# Patient Record
Sex: Female | Born: 1970
Health system: Southern US, Community
[De-identification: ages and names within clinical notes are randomized; demographics above are authoritative.]

## PROBLEM LIST (undated history)

## (undated) DIAGNOSIS — G4730 Sleep apnea, unspecified: Secondary | ICD-10-CM

## (undated) DIAGNOSIS — J342 Deviated nasal septum: Secondary | ICD-10-CM

## (undated) DIAGNOSIS — Z923 Personal history of irradiation: Secondary | ICD-10-CM

## (undated) HISTORY — PX: OTHER SURGICAL HISTORY: SHX169

## (undated) HISTORY — DX: Personal history of irradiation: Z92.3

## (undated) HISTORY — PX: ENDOMETRIAL ABLATION W/ NOVASURE: SUR434

---

## 1997-08-19 ENCOUNTER — Encounter: Admission: RE | Admit: 1997-08-19 | Discharge: 1997-08-19 | Payer: Self-pay | Admitting: *Deleted

## 1997-10-13 ENCOUNTER — Encounter: Admission: RE | Admit: 1997-10-13 | Discharge: 1997-10-13 | Payer: Self-pay | Admitting: *Deleted

## 1998-01-12 ENCOUNTER — Encounter: Admission: RE | Admit: 1998-01-12 | Discharge: 1998-01-12 | Payer: Self-pay | Admitting: *Deleted

## 1998-03-18 ENCOUNTER — Ambulatory Visit (HOSPITAL_COMMUNITY): Admission: RE | Admit: 1998-03-18 | Discharge: 1998-03-18 | Payer: Self-pay | Admitting: Emergency Medicine

## 1999-03-18 ENCOUNTER — Other Ambulatory Visit: Admission: RE | Admit: 1999-03-18 | Discharge: 1999-03-18 | Payer: Self-pay | Admitting: Gynecology

## 2000-03-29 ENCOUNTER — Other Ambulatory Visit: Admission: RE | Admit: 2000-03-29 | Discharge: 2000-03-29 | Payer: Self-pay | Admitting: Gynecology

## 2001-04-15 ENCOUNTER — Ambulatory Visit (HOSPITAL_COMMUNITY): Admission: RE | Admit: 2001-04-15 | Discharge: 2001-04-15 | Payer: Self-pay | Admitting: Obstetrics and Gynecology

## 2001-04-15 ENCOUNTER — Other Ambulatory Visit: Admission: RE | Admit: 2001-04-15 | Discharge: 2001-04-15 | Payer: Self-pay | Admitting: Obstetrics and Gynecology

## 2001-04-15 ENCOUNTER — Encounter: Payer: Self-pay | Admitting: Obstetrics and Gynecology

## 2001-07-14 HISTORY — PX: DILATION AND EVACUATION: SHX1459

## 2001-11-26 ENCOUNTER — Ambulatory Visit (HOSPITAL_COMMUNITY): Admission: RE | Admit: 2001-11-26 | Discharge: 2001-11-26 | Payer: Self-pay | Admitting: Obstetrics and Gynecology

## 2001-11-26 ENCOUNTER — Encounter: Payer: Self-pay | Admitting: Obstetrics and Gynecology

## 2002-02-03 ENCOUNTER — Ambulatory Visit (HOSPITAL_COMMUNITY): Admission: RE | Admit: 2002-02-03 | Discharge: 2002-02-03 | Payer: Self-pay | Admitting: Obstetrics and Gynecology

## 2002-04-21 ENCOUNTER — Inpatient Hospital Stay (HOSPITAL_COMMUNITY): Admission: AD | Admit: 2002-04-21 | Discharge: 2002-04-21 | Payer: Self-pay | Admitting: Obstetrics and Gynecology

## 2002-04-26 ENCOUNTER — Inpatient Hospital Stay (HOSPITAL_COMMUNITY): Admission: AD | Admit: 2002-04-26 | Discharge: 2002-04-29 | Payer: Self-pay | Admitting: Obstetrics and Gynecology

## 2002-06-16 ENCOUNTER — Other Ambulatory Visit: Admission: RE | Admit: 2002-06-16 | Discharge: 2002-06-16 | Payer: Self-pay | Admitting: Obstetrics and Gynecology

## 2003-10-02 ENCOUNTER — Other Ambulatory Visit: Admission: RE | Admit: 2003-10-02 | Discharge: 2003-10-02 | Payer: Self-pay | Admitting: Obstetrics and Gynecology

## 2004-02-01 ENCOUNTER — Ambulatory Visit (HOSPITAL_COMMUNITY): Admission: RE | Admit: 2004-02-01 | Discharge: 2004-02-01 | Payer: Self-pay | Admitting: Obstetrics and Gynecology

## 2004-04-19 ENCOUNTER — Inpatient Hospital Stay (HOSPITAL_COMMUNITY): Admission: AD | Admit: 2004-04-19 | Discharge: 2004-04-21 | Payer: Self-pay | Admitting: Obstetrics and Gynecology

## 2004-12-29 ENCOUNTER — Other Ambulatory Visit: Admission: RE | Admit: 2004-12-29 | Discharge: 2004-12-29 | Payer: Self-pay | Admitting: Obstetrics and Gynecology

## 2005-05-02 ENCOUNTER — Ambulatory Visit (HOSPITAL_COMMUNITY): Admission: RE | Admit: 2005-05-02 | Discharge: 2005-05-02 | Payer: Self-pay | Admitting: Obstetrics and Gynecology

## 2005-07-21 ENCOUNTER — Inpatient Hospital Stay (HOSPITAL_COMMUNITY): Admission: AD | Admit: 2005-07-21 | Discharge: 2005-07-23 | Payer: Self-pay | Admitting: Obstetrics and Gynecology

## 2008-02-14 HISTORY — PX: ENDOMETRIAL ABLATION W/ NOVASURE: SUR434

## 2008-04-27 ENCOUNTER — Ambulatory Visit (HOSPITAL_BASED_OUTPATIENT_CLINIC_OR_DEPARTMENT_OTHER): Admission: RE | Admit: 2008-04-27 | Discharge: 2008-04-27 | Payer: Self-pay | Admitting: Obstetrics and Gynecology

## 2008-04-27 ENCOUNTER — Ambulatory Visit: Payer: Self-pay | Admitting: Diagnostic Radiology

## 2010-07-01 NOTE — Discharge Summary (Signed)
Amber [name], Amber [name]               ACCOUNT NO.:  [id number]   MEDICAL RECORD NO.:  [id number]          PATIENT TYPE:  INP   LOCATION:  9129                          FACILITY:  [facility]   PHYSICIAN:  Huel Cote, M.D. DATE OF BIRTH:  Feb 23, 1970   DATE OF ADMISSION:  04/19/2004  DATE OF DISCHARGE:  04/21/2004                                 DISCHARGE SUMMARY   DISCHARGE DIAGNOSES:  1.  Term pregnancy at 39+ weeks delivered.  2.  Status post normal spontaneous vaginal delivery.   DISCHARGE MEDICATIONS:  1.  Motrin 600 mg p.o. q.6h.  2.  Darvocet one to two tablets p.o. q.4h. p.r.n.   DISCHARGE FOLLOWUP:  The patient is to follow up in the office in  approximately 6 weeks for her routine postpartum exam.   HOSPITAL COURSE:  The patient is a 40 year old G3, P 1-0-1-1 at 39+ weeks  who presented to labor and delivery in labor. Contractions were every 5  minutes. She had no rupture of membranes. Prenatal labs were as follows:  A  negative, antibody negative, RPR nonreactive, rubella immune, hepatitis B  surface antigen negative, HIV declined, GC negative, chlamydia negative,  group B strep negative, 1-hour Glucola of 107, and triple screen normal.  Past OB history: In March 2004 she had a vaginal delivery of a 7-pound 11-  ounce infant, and a miscarriage x1 prior to that. Past GYN history: None.  Past medical history:  None. Past surgical history: D&C with her miscarriage  in 2003. She had no allergies. On admission she was afebrile with stable  vital signs. Fetal heart rate was reactive. Cervical exam was 80 and 4+ at a  -1. She did very well and progressed nicely after assisted rupture of  membranes performed. She reached complete dilation and pushed well with  normal spontaneous vaginal delivery of a viable female infant over a small  first-degree laceration. Apgars were 7 and 9, weight was 8 pounds 9 ounces.  There is a short cord noted. Otherwise, all was normal. The patient had a  small first-degree laceration which was repaired with 2-0 Vicryl for  hemostasis. She was then admitted for routine postpartum care and did very  well. Postpartum day #2 she was felt stable for discharge home.      KR/MEDQ  D:  05/17/2004  T:  05/17/2004  Job:  [id number]

## 2010-07-01 NOTE — Discharge Summary (Signed)
Amber [name], Amber [name]               ACCOUNT NO.:  [id number]   MEDICAL RECORD NO.:  [id number]          PATIENT TYPE:  INP   LOCATION:  9114                          [facility]ACILITY:  [facility]   PHYSICIAN:  Huel Cote, M.D. DATE OF BIRTH:  Feb 04, 1971   DATE OF ADMISSION:  07/21/2005  DATE OF DISCHARGE:  07/23/2005                                 DISCHARGE SUMMARY   DISCHARGE DIAGNOSES:  1.  Term pregnancy at 39-4/7 weeks, delivered.  2.  Status post normal spontaneous vaginal delivery.  3.  Group B strep positive.   DISCHARGE MEDICATIONS:  Motrin 600 mg p.o. every 6 hours p.r.n.   DISCHARGE FOLLOWUP:  The patient is to follow up in my office in 6 weeks for  her routine postpartum exam.   HOSPITAL COURSE:  The patient is a 40 year old G4, P2-0-1-2, who was  admitted to 39-4/7 weeks' gestation for induction of labor, given term  status of favorable cervix.  Prenatal care was uneventful except for  positive group B strep status.  Prenatal labs are as follows:  A negative,  antibody negative, hepatitis B surface antigen negative, rubella immune, RPR  nonreactive, HIV declined, GC negative, chlamydia negative, 1-hour Glucola  111 and group B strep positive.   PAST OBSTETRICAL HISTORY:  She had 1 spontaneous miscarriage and 2 vaginal  deliveries at term without complications.   PAST GYNECOLOGICAL HISTORY:  None.   PAST MEDICAL HISTORY:  None.   PAST SURGICAL HISTORY:  D&C only.   ALLERGIES:  She had no known drug allergies.   PHYSICAL EXAMINATION:  VITAL SIGNS:  On admission, she was afebrile.  Fetal  heart rate was reactive.  Contractions were approximately every 10 minutes.  estimated fetal weight was 7-1/2 to 8 pounds.  Pelvic exam was 50, 2+ and -2  station.   HOSPITAL COURSE:  She had rupture of membranes with clear fluid noted and  was placed on penicillin for her group-B-strep-positive status.  Shortly  after arrival, the patient quickly progressed to complete dilation and  pushed well.  I was called to see the patient for fetal heart rate  decelerations noted to the 70s, which did not resolve with repositioning and  application of oxygen, therefore the patient was instructed to push and she  did descend the vertex to a +2 to +3 station with approximately 2-3 minutes  of pushing.  Fetal heart rate still did not recover, so the Mityvac vacuum  was applied at the outlet and the vertex was delivered to crowning and 1  pull.  Maternal effort pushed out the remainder of the baby without  difficulty.  A vigorous female infant was born; Apgar scores were 8 and 9.  Weight was 7 pounds 10 ounces.  Placenta delivered spontaneously.  A small  first-degree laceration was repaired  with 2-0 Vicryl for hemostasis.  The cervix and rectum were intact.  The  patient was then kept postpartum.  On postpartum day #2, she was doing very  well.  She was afebrile with stable vital signs.  Her fundus firm and she  was felt stable for discharge  home.      Huel Cote, M.D.  Electronically Signed     KR/MEDQ  D:  07/23/2005  T:  07/24/2005  Job:  [id number]

## 2010-07-01 NOTE — Discharge Summary (Signed)
NAMEMARLEAH, [name]                         ACCOUNT NO.:  [id number]   MEDICAL RECORD NO.:  [id number]                   PATIENT TYPE:  INP   LOCATION:  9127                                 FACILITY:  [facility]   PHYSICIAN:  Zenaida Niece, M.D.             DATE OF BIRTH:  1970/07/19   DATE OF ADMISSION:  04/26/2002  DATE OF DISCHARGE:  04/29/2002                                 DISCHARGE SUMMARY   ADMISSION DIAGNOSES:  1. Intrauterine pregnancy at 39 weeks.  2. Premature rupture of membranes.   DISCHARGE DIAGNOSES:  1. Intrauterine pregnancy at 39 weeks.  2. Premature rupture of membranes.   PROCEDURE:  On March 14, she had a vacuum-assisted vaginal delivery.   HISTORY OF PRESENT ILLNESS:  This is a 40 year old, white female, G2, P0-0-1-  0 with an EGA of [redacted] weeks by LMP consistent with a nine-week ultrasound who  presents with the complaint of a spontaneous rupture of membranes at 1500  hours on the day of admission with clear fluid, with occasional  contractions, no bleeding and good fetal movement.  Evaluation confirmed  rupture of membranes and her vaginal exam initially was 1 cm dilated.   PRENATAL COURSE:  She was treated with Omnicef at approximately 26 weeks for  bronchitis and was otherwise uncomplicated.   PRENATAL LABORATORY DATA:  Blood type A negative with a negative antibody  screen.  RPR nonreactive.  Rubella immune.  Hepatitis B surface antigen  negative.  HIV negative.  Gonorrhea and Chlamydia negative.  Pap smear  normal in March 2003.  Triple screen normal.  One-hour Glucola 98.  Group B  Streptococcus is negative.   PAST OBSTETRICAL HISTORY:  In 2003, she had a missed abortion treated with a  D&C.   PHYSICAL EXAMINATION:  VITAL SIGNS:  Afebrile with stable vital signs.  Fetal heart tracing reactive with contractions every three to four minutes.  ABDOMEN:  Gravid with an estimated fetal weight of 7.5 pounds.  PELVIC:  When I first saw the patient, per  the nurse, she was 3 cm dilated  with a vertex presentation.   HOSPITAL COURSE:  The patient was admitted and initially walked and had  increasing contractions.  With her increased contractions, she received an  epidural.  Her contractions then spaced out and she was started on low dose  Pitocin.  Throughout the evening of March 13, and the early morning of March  14, she progressed to complete and pushed for approximately two hours.  She  had frequent nausea and vomiting with pushing and the fetal heart tracing  remained reassuring except at the end with the baseline increasing to the  160s.  She then had a vacuum-assisted vaginal delivery.  She delivered a  viable female infant with Apgar's 9 and 9 that weighed 7 pounds 11 ounces.  The baby was in the OA to LOA position.  Placenta delivered spontaneous and  was intact.  She had bilateral labial lacerations and a first-degree  perineal laceration.  The right labial and first-degree perineal lacerations  were repaired with 3-0 Vicryl repeat for hemostasis.  Left labial laceration  was hemostatic and not repaired.  Estimated blood loss was less than 500 cc.  Postpartum, the patient did very well, remained afebrile and had no  complications.  She breast-fed her baby without problems.  On the morning of  postpartum day #2, she was felt to be stable enough for discharge home.   DIET:  Regular diet.   ACTIVITY:  Pelvic rest.   FOLLOW UP:  Follow up in six weeks.   DISCHARGE MEDICATIONS:  Over-the-counter Motrin as needed.   SPECIAL INSTRUCTIONS:  She is given our discharge pamphlet.                                               Zenaida Niece, M.D.    TDM/MEDQ  D:  04/29/2002  T:  04/29/2002  Job:  [id number]

## 2011-05-02 ENCOUNTER — Other Ambulatory Visit: Payer: Self-pay

## 2011-05-02 MED ORDER — CYCLOBENZAPRINE HCL 10 MG PO TABS: 10.0000 mg | ORAL_TABLET | Freq: Three times a day (TID) | ORAL | Status: AC | PRN

## 2011-05-02 NOTE — Progress Notes (Signed)
Pt c/o of right lower back pain--per dr wert send rx for flexeril 10mg  1/2-1 three times daily as needed for muscle spasms #90 with 1 refill--done

## 2011-06-27 ENCOUNTER — Ambulatory Visit (HOSPITAL_BASED_OUTPATIENT_CLINIC_OR_DEPARTMENT_OTHER)
Admission: RE | Admit: 2011-06-27 | Discharge: 2011-06-27 | Disposition: A | Discharge status: Home / Self Care | Payer: 59 | Source: Ambulatory Visit | Attending: Obstetrics and Gynecology | Admitting: Obstetrics and Gynecology

## 2011-06-27 ENCOUNTER — Other Ambulatory Visit (HOSPITAL_BASED_OUTPATIENT_CLINIC_OR_DEPARTMENT_OTHER): Payer: Self-pay | Admitting: Obstetrics and Gynecology

## 2011-06-27 DIAGNOSIS — Z1231 Encounter for screening mammogram for malignant neoplasm of breast: Secondary | ICD-10-CM | POA: Insufficient documentation

## 2011-09-21 ENCOUNTER — Telehealth: Payer: Self-pay | Admitting: Internal Medicine

## 2011-09-21 ENCOUNTER — Other Ambulatory Visit: Payer: Self-pay | Admitting: Pulmonary Disease

## 2011-09-21 MED ORDER — ONDANSETRON HCL 4 MG PO TABS: 4.0000 mg | ORAL_TABLET | Freq: Three times a day (TID) | ORAL | Status: AC | PRN

## 2011-09-21 NOTE — Telephone Encounter (Signed)
Pt with n/v.  Ok per SN for zofran 4mg  1 tab q8h prn nausea #50 with 3 refills.  rx sent to [facility].  Pt is aware.

## 2011-09-21 NOTE — Telephone Encounter (Signed)
oopened in error while attempting to send communication about another patient.

## 2012-02-02 ENCOUNTER — Other Ambulatory Visit: Payer: Self-pay | Admitting: Pulmonary Disease

## 2012-02-02 MED ORDER — PREDNISONE 10 MG PO TABS: ORAL_TABLET | ORAL | Status: DC

## 2012-04-10 ENCOUNTER — Telehealth: Payer: Self-pay | Admitting: *Deleted

## 2012-04-10 MED ORDER — KETOCONAZOLE 2 % EX CREA: TOPICAL_CREAM | Freq: Every day | CUTANEOUS | Status: DC

## 2012-04-10 MED ORDER — DOXYCYCLINE HYCLATE 100 MG PO TABS: 100.0000 mg | ORAL_TABLET | Freq: Two times a day (BID) | ORAL | Status: DC

## 2012-04-10 NOTE — Telephone Encounter (Signed)
Per MW: ok for doxycycline 100mg  BID x7days #14 w/ 0 refills and ketoconazole topical 2% cream apply daily x2 weeks, 0 refills.  Rx's sent to verified pharmacy.

## 2012-10-22 ENCOUNTER — Other Ambulatory Visit (HOSPITAL_BASED_OUTPATIENT_CLINIC_OR_DEPARTMENT_OTHER): Payer: Self-pay | Admitting: Obstetrics and Gynecology

## 2012-10-22 DIAGNOSIS — Z1231 Encounter for screening mammogram for malignant neoplasm of breast: Secondary | ICD-10-CM

## 2012-10-23 ENCOUNTER — Ambulatory Visit (HOSPITAL_BASED_OUTPATIENT_CLINIC_OR_DEPARTMENT_OTHER)
Admission: RE | Admit: 2012-10-23 | Discharge: 2012-10-23 | Disposition: A | Discharge status: Home / Self Care | Payer: 59 | Source: Ambulatory Visit | Attending: Obstetrics and Gynecology | Admitting: Obstetrics and Gynecology

## 2012-10-23 DIAGNOSIS — Z1231 Encounter for screening mammogram for malignant neoplasm of breast: Secondary | ICD-10-CM | POA: Insufficient documentation

## 2013-07-31 ENCOUNTER — Encounter: Payer: Self-pay | Admitting: Family Medicine

## 2013-07-31 ENCOUNTER — Other Ambulatory Visit (INDEPENDENT_AMBULATORY_CARE_PROVIDER_SITE_OTHER): Payer: 59

## 2013-07-31 ENCOUNTER — Ambulatory Visit (INDEPENDENT_AMBULATORY_CARE_PROVIDER_SITE_OTHER): Payer: 59 | Admitting: Family Medicine

## 2013-07-31 VITALS — BP 94/62 | HR 82 | Ht 68.0 in | Wt 132.0 lb

## 2013-07-31 DIAGNOSIS — M79609 Pain in unspecified limb: Secondary | ICD-10-CM

## 2013-07-31 DIAGNOSIS — M779 Enthesopathy, unspecified: Secondary | ICD-10-CM

## 2013-07-31 DIAGNOSIS — M778 Other enthesopathies, not elsewhere classified: Secondary | ICD-10-CM | POA: Insufficient documentation

## 2013-07-31 DIAGNOSIS — M79671 Pain in right foot: Secondary | ICD-10-CM

## 2013-07-31 DIAGNOSIS — M65979 Unspecified synovitis and tenosynovitis, unspecified ankle and foot: Secondary | ICD-10-CM

## 2013-07-31 DIAGNOSIS — M659 Synovitis and tenosynovitis, unspecified: Secondary | ICD-10-CM

## 2013-07-31 HISTORY — DX: Other enthesopathies, not elsewhere classified: M77.8

## 2013-07-31 NOTE — Progress Notes (Signed)
[name] [address], [place] [address] Phone: [phone] Subjective:     CC: Foot swelling and pain  [id number] Amber [name] is a 43 y.o. female coming in with complaint of foot swelling and pain. Patient states that this and then approximately 1 week and seems to getting worse. Patient has been moving a significant amount of furniture recently. States that overall it is more sore. Patient is able to ambulate without any significant pain. Patient states that last night had significant amount of soreness. Patient is finding it difficult to concentrate at work secondary to some soreness. Patient describes it is more of a dull aching pain. It responded to anti-inflammatories. Patient does not like to take him a regular basis. Patient has not tried ice or any other modalities. Denies any true trauma. Rate the severity at 6/10.     Past medical history, social, surgical and family history all reviewed in electronic medical record.   Review of Systems: No headache, visual changes, nausea, vomiting, diarrhea, constipation, dizziness, abdominal pain, skin rash, fevers, chills, night sweats, weight loss, swollen lymph nodes, body aches, joint swelling, muscle aches, chest pain, shortness of breath, mood changes.   Objective Blood pressure 94/62, pulse 82, height 5\' 8"  (1.727 m), weight 132 lb (59.875 kg), SpO2 98.00%.  General: No apparent distress alert and oriented x3 mood and affect normal, dressed appropriately.  HEENT: Pupils equal, extraocular movements intact  Respiratory: Patient's speak in full sentences and does not appear short of breath  Cardiovascular: No lower extremity edema, non tender, no erythema  Skin: Warm dry intact with no signs of infection or rash on extremities or on axial skeleton.  Abdomen: Soft nontender  Neuro: Cranial nerves II through XII are intact, neurovascularly intact in all extremities with 2+ DTRs and 2+ pulses.   Lymph: No lymphadenopathy of posterior or anterior cervical chain or axillae bilaterally.  Gait normal with good balance and coordination.  MSK:  Non tender with full range of motion and good stability and symmetric strength and tone of shoulders, elbows, wrist, hip, knees bilaterally.  Ankle: Right Patient does have a good amount of swelling over the dorsal aspect of the foot the Range of motion is full in all directions. Strength is 5/5 in all directions. Stable lateral and medial ligaments; squeeze test and kleiger test unremarkable; Talar dome nontender; No pain at base of 5th MT; No tenderness over cuboid; No tenderness over N spot or navicular prominence No tenderness on posterior aspects of lateral and medial malleolus No sign of peroneal tendon subluxations or tenderness to palpation Negative tarsal tunnel tinel's Patient is tender to palpation over the first metatarsal Able to walk 4 steps. Contralateral foot unremarkable  MSK US performed of: Right foot This study was ordered, performed, and interpreted by Charlann Boxer D.O.  Foot/Ankle:   All structures visualized.   Talar dome unremarkable  Ankle mortise without effusion. Peroneus longus and brevis tendons unremarkable on long and transverse views without sheath effusions. Posterior tibialis, flexor hallucis longus, and flexor digitorum longus tendons unremarkable on long and transverse views without sheath effusions. Achilles tendon visualized along length of tendon and unremarkable on long and transverse views without sheath effusion. Anterior Talofibular Ligament and Calcaneofibular Ligaments unremarkable and intact. Deltoid Ligament unremarkable and intact. Plantar fascia intact and without effusion, normal thickness. No increased doppler signal, cap sign, or thickening of tibial cortex. Power doppler signal normal. Patient does have severe tendon sheath effusion of  the extensor hallux tendon.  IMPRESSION:  Extensor  tendinitis of the hallux     Impression and Recommendations:     This case required medical decision making of moderate complexity.

## 2013-07-31 NOTE — Assessment & Plan Note (Signed)
Patient does have significant swelling of the extensor tendons mostly over the first toe. I do not see any signs of a stress fracture. Patient does not have any Lisfranc injury noted. Patient is able angulated and discussed proper shoe choices. We discussed icing and patient was given a trial some anti-inflammatories. Patient will try these interventions and come back again in 2 weeks. Continuing to have pain we may need to consider further imaging such as an x-ray as well as a Dispensing optician. We'll discuss have followup.

## 2013-07-31 NOTE — Patient Instructions (Signed)
Good to meet you Ice bath 20 minutes 2 times daily Take the medicine 3 times daily for 6 days No running but biking or swimming be best right now.  Rigid sole shoes.  Check in in 2 weeks.

## 2014-06-04 ENCOUNTER — Other Ambulatory Visit: Payer: Self-pay | Admitting: Internal Medicine

## 2014-06-04 MED ORDER — DOXYCYCLINE HYCLATE 100 MG PO TABS: 100.0000 mg | ORAL_TABLET | Freq: Two times a day (BID) | ORAL | Status: DC

## 2014-06-26 ENCOUNTER — Other Ambulatory Visit: Payer: Self-pay | Admitting: Internal Medicine

## 2014-06-26 MED ORDER — OSELTAMIVIR PHOSPHATE 75 MG PO CAPS: ORAL_CAPSULE | ORAL | Status: DC

## 2014-08-27 ENCOUNTER — Other Ambulatory Visit: Payer: Self-pay | Admitting: Internal Medicine

## 2014-08-27 MED ORDER — KETOCONAZOLE 2 % EX CREA
TOPICAL_CREAM | Freq: Every day | CUTANEOUS | Status: DC
Start: 2014-08-27 — End: 2017-07-03

## 2015-03-15 MED FILL — SERTRALINE HCL 50 MG TABLET: 50 | 30 days supply | Qty: 30 | Fill #0

## 2015-03-29 DIAGNOSIS — Z0000 Encounter for general adult medical examination without abnormal findings: Secondary | ICD-10-CM | POA: Diagnosis not present

## 2015-03-29 DIAGNOSIS — Z681 Body mass index (BMI) 19 or less, adult: Secondary | ICD-10-CM | POA: Diagnosis not present

## 2015-03-29 DIAGNOSIS — Z01419 Encounter for gynecological examination (general) (routine) without abnormal findings: Secondary | ICD-10-CM | POA: Diagnosis not present

## 2015-03-29 DIAGNOSIS — Z130 Encounter for screening for diseases of the blood and blood-forming organs and certain disorders involving the immune mechanism: Secondary | ICD-10-CM | POA: Diagnosis not present

## 2015-03-29 DIAGNOSIS — Z1389 Encounter for screening for other disorder: Secondary | ICD-10-CM | POA: Diagnosis not present

## 2015-03-29 DIAGNOSIS — Z1231 Encounter for screening mammogram for malignant neoplasm of breast: Secondary | ICD-10-CM | POA: Diagnosis not present

## 2015-05-17 ENCOUNTER — Telehealth: Payer: Self-pay | Admitting: Internal Medicine

## 2015-05-17 MED ORDER — AZITHROMYCIN 250 MG PO TABS: ORAL_TABLET | ORAL | Status: DC

## 2015-05-17 MED FILL — AZITHROMYCIN 250 MG TABLET: 250 | 5 days supply | Qty: 6 | Fill #0

## 2015-05-17 NOTE — Telephone Encounter (Signed)
Pt c/o head cold symptoms. Sinus congestion, stuffiness, and sinus pressure starting over the weekend. Verified pharmacy as [facility] outpatient ph[facility]racy. Spoke with MW and made him aware of symptoms.  Per verbal order from [place] to send zpak  Spoke with pt and informed her that zpak was sent to [place]. Nothing further needed.

## 2016-04-14 ENCOUNTER — Other Ambulatory Visit: Payer: Self-pay | Admitting: Internal Medicine

## 2016-04-14 MED ORDER — AZITHROMYCIN 250 MG PO TABS: ORAL_TABLET | ORAL | 0 refills | Status: DC

## 2016-07-27 DIAGNOSIS — Z1231 Encounter for screening mammogram for malignant neoplasm of breast: Secondary | ICD-10-CM | POA: Diagnosis not present

## 2016-07-28 ENCOUNTER — Other Ambulatory Visit: Payer: Self-pay | Admitting: Obstetrics and Gynecology

## 2016-07-28 DIAGNOSIS — R5381 Other malaise: Secondary | ICD-10-CM

## 2016-07-31 ENCOUNTER — Other Ambulatory Visit: Payer: Self-pay | Admitting: Obstetrics and Gynecology

## 2016-07-31 DIAGNOSIS — N63 Unspecified lump in unspecified breast: Secondary | ICD-10-CM

## 2016-08-03 ENCOUNTER — Other Ambulatory Visit: Payer: Self-pay | Admitting: Obstetrics and Gynecology

## 2016-08-03 ENCOUNTER — Ambulatory Visit
Admission: RE | Admit: 2016-08-03 | Discharge: 2016-08-03 | Disposition: A | Discharge status: Home / Self Care | Payer: 59 | Source: Ambulatory Visit | Attending: Obstetrics and Gynecology | Admitting: Obstetrics and Gynecology

## 2016-08-03 DIAGNOSIS — N63 Unspecified lump in unspecified breast: Secondary | ICD-10-CM

## 2016-08-03 DIAGNOSIS — N6489 Other specified disorders of breast: Secondary | ICD-10-CM | POA: Diagnosis not present

## 2016-08-03 DIAGNOSIS — R922 Inconclusive mammogram: Secondary | ICD-10-CM | POA: Diagnosis not present

## 2016-11-06 ENCOUNTER — Telehealth: Payer: Self-pay | Admitting: Acute Care

## 2016-11-06 MED ORDER — AZITHROMYCIN 250 MG PO TABS: ORAL_TABLET | ORAL | 0 refills | Status: DC

## 2016-11-06 MED FILL — AZITHROMYCIN 250 MG TAB: 250 | 5 days supply | Qty: 6 | Fill #0

## 2016-11-06 NOTE — Telephone Encounter (Signed)
[place] for Zpak per SG  Rx sent to verified pharmacy

## 2016-12-14 ENCOUNTER — Telehealth: Payer: Self-pay | Admitting: Internal Medicine

## 2016-12-14 MED ORDER — ACYCLOVIR 5 % EX OINT: 1.0000 "application " | TOPICAL_OINTMENT | CUTANEOUS | 99 refills | Status: DC

## 2016-12-14 NOTE — Telephone Encounter (Signed)
Okay per Donna verified

## 2016-12-15 MED ORDER — ACYCLOVIR 5 % EX OINT: 1.0000 "application " | TOPICAL_OINTMENT | CUTANEOUS | 99 refills | Status: DC

## 2016-12-15 NOTE — Addendum Note (Signed)
Addended by: Parke Poisson E on: 12/15/2016 03:20 PM   Modules accepted: Orders

## 2016-12-15 NOTE — Telephone Encounter (Signed)
Pharmacy change per pt's request

## 2017-01-26 DIAGNOSIS — Z130 Encounter for screening for diseases of the blood and blood-forming organs and certain disorders involving the immune mechanism: Secondary | ICD-10-CM | POA: Diagnosis not present

## 2017-01-26 DIAGNOSIS — Z1389 Encounter for screening for other disorder: Secondary | ICD-10-CM | POA: Diagnosis not present

## 2017-01-26 DIAGNOSIS — Z1151 Encounter for screening for human papillomavirus (HPV): Secondary | ICD-10-CM | POA: Diagnosis not present

## 2017-01-26 DIAGNOSIS — Z124 Encounter for screening for malignant neoplasm of cervix: Secondary | ICD-10-CM | POA: Diagnosis not present

## 2017-01-26 DIAGNOSIS — Z01419 Encounter for gynecological examination (general) (routine) without abnormal findings: Secondary | ICD-10-CM | POA: Diagnosis not present

## 2017-01-26 DIAGNOSIS — N898 Other specified noninflammatory disorders of vagina: Secondary | ICD-10-CM | POA: Diagnosis not present

## 2017-01-26 DIAGNOSIS — R300 Dysuria: Secondary | ICD-10-CM | POA: Diagnosis not present

## 2017-01-26 LAB — HM PAP SMEAR: HM Pap smear: NEGATIVE

## 2017-01-31 MED FILL — SULFAMETHOXAZOLE-TMP DS TAB: 800-160 | 5 days supply | Qty: 10 | Fill #0

## 2017-02-13 HISTORY — PX: BREAST BIOPSY: SHX20

## 2017-02-16 ENCOUNTER — Ambulatory Visit
Admission: RE | Admit: 2017-02-16 | Discharge: 2017-02-16 | Disposition: A | Discharge status: Home / Self Care | Payer: 59 | Source: Ambulatory Visit | Attending: Obstetrics and Gynecology | Admitting: Obstetrics and Gynecology

## 2017-02-16 ENCOUNTER — Other Ambulatory Visit (INDEPENDENT_AMBULATORY_CARE_PROVIDER_SITE_OTHER): Payer: 59

## 2017-02-16 ENCOUNTER — Telehealth: Payer: Self-pay | Admitting: Pulmonary Disease

## 2017-02-16 ENCOUNTER — Ambulatory Visit (INDEPENDENT_AMBULATORY_CARE_PROVIDER_SITE_OTHER)
Admission: RE | Admit: 2017-02-16 | Discharge: 2017-02-16 | Disposition: A | Discharge status: Home / Self Care | Payer: 59 | Source: Ambulatory Visit | Attending: Pulmonary Disease | Admitting: Pulmonary Disease

## 2017-02-16 ENCOUNTER — Other Ambulatory Visit: Payer: Self-pay | Admitting: Obstetrics and Gynecology

## 2017-02-16 DIAGNOSIS — Z0000 Encounter for general adult medical examination without abnormal findings: Secondary | ICD-10-CM

## 2017-02-16 DIAGNOSIS — N63 Unspecified lump in unspecified breast: Secondary | ICD-10-CM

## 2017-02-16 DIAGNOSIS — N62 Hypertrophy of breast: Secondary | ICD-10-CM | POA: Diagnosis not present

## 2017-02-16 DIAGNOSIS — N6001 Solitary cyst of right breast: Secondary | ICD-10-CM | POA: Diagnosis not present

## 2017-02-16 DIAGNOSIS — N6311 Unspecified lump in the right breast, upper outer quadrant: Secondary | ICD-10-CM | POA: Diagnosis not present

## 2017-02-16 DIAGNOSIS — R922 Inconclusive mammogram: Secondary | ICD-10-CM | POA: Diagnosis not present

## 2017-02-16 LAB — CBC WITH DIFFERENTIAL/PLATELET
BASOS ABS: 0 10*3/uL (ref 0.0–0.1)
BASOS PCT: 0.4 % (ref 0.0–3.0)
EOS ABS: 0 10*3/uL (ref 0.0–0.7)
Eosinophils Relative: 0.1 % (ref 0.0–5.0)
HEMATOCRIT: 43.8 % (ref 36.0–46.0)
HEMOGLOBIN: 14.5 g/dL (ref 12.0–15.0)
LYMPHS PCT: 9 % — AB (ref 12.0–46.0)
Lymphs Abs: 0.7 10*3/uL (ref 0.7–4.0)
MCHC: 33.1 g/dL (ref 30.0–36.0)
MCV: 89.2 fl (ref 78.0–100.0)
MONO ABS: 0.4 10*3/uL (ref 0.1–1.0)
Monocytes Relative: 5.6 % (ref 3.0–12.0)
Neutro Abs: 6.8 10*3/uL (ref 1.4–7.7)
Neutrophils Relative %: 84.9 % — ABNORMAL HIGH (ref 43.0–77.0)
Platelets: 227 10*3/uL (ref 150.0–400.0)
RBC: 4.91 Mil/uL (ref 3.87–5.11)
RDW: 12.8 % (ref 11.5–15.5)
WBC: 8 10*3/uL (ref 4.0–10.5)

## 2017-02-16 LAB — BASIC METABOLIC PANEL
BUN: 14 mg/dL (ref 6–23)
CALCIUM: 9.8 mg/dL (ref 8.4–10.5)
CHLORIDE: 103 meq/L (ref 96–112)
CO2: 30 mEq/L (ref 19–32)
CREATININE: 0.73 mg/dL (ref 0.40–1.20)
GFR: 91.35 mL/min (ref >60.00)
Glucose, Bld: 100 mg/dL — ABNORMAL HIGH (ref 70–99)
Potassium: 4.5 mEq/L (ref 3.5–5.1)
Sodium: 140 mEq/L (ref 135–145)

## 2017-02-16 LAB — HEPATIC FUNCTION PANEL
ALBUMIN: 4.6 g/dL (ref 3.5–5.2)
ALK PHOS: 39 U/L (ref 39–117)
ALT: 16 U/L (ref 0–35)
AST: 17 U/L (ref 0–37)
BILIRUBIN DIRECT: 0.1 mg/dL (ref 0.0–0.3)
TOTAL PROTEIN: 7.3 g/dL (ref 6.0–8.3)
Total Bilirubin: 0.5 mg/dL (ref 0.2–1.2)

## 2017-02-16 LAB — LIPID PANEL
CHOL/HDL RATIO: 2
CHOLESTEROL: 194 mg/dL (ref 0–200)
HDL: 80.3 mg/dL (ref >39.00)
LDL Cholesterol: 98 mg/dL (ref 0–99)
NonHDL: 113.21
TRIGLYCERIDES: 75 mg/dL (ref 0.0–149.0)
VLDL: 15 mg/dL (ref 0.0–40.0)

## 2017-02-16 LAB — VITAMIN D 25 HYDROXY (VIT D DEFICIENCY, FRACTURES): VITD: 31.32 ng/mL (ref 30.00–100.00)

## 2017-02-16 LAB — TSH: TSH: 0.83 u[IU]/mL (ref 0.35–4.50)

## 2017-02-16 NOTE — Telephone Encounter (Signed)
Physical cxr and labs

## 2017-02-17 LAB — RHEUMATOID FACTOR: Rhuematoid fact SerPl-aCnc: <14 IU/mL (ref <14)

## 2017-03-27 DIAGNOSIS — N6310 Unspecified lump in the right breast, unspecified quadrant: Secondary | ICD-10-CM | POA: Diagnosis not present

## 2017-06-06 MED FILL — SULFAMETHOXAZOLE-TMP DS TAB: 800-160 | 7 days supply | Qty: 14 | Fill #0

## 2017-07-03 ENCOUNTER — Encounter: Payer: Self-pay | Admitting: Family

## 2017-07-03 ENCOUNTER — Ambulatory Visit: Payer: 59 | Admitting: Family

## 2017-07-03 VITALS — BP 110/60 | HR 69 | Temp 98.4°F | Resp 16 | Ht 68.0 in | Wt 134.8 lb

## 2017-07-03 DIAGNOSIS — Z0000 Encounter for general adult medical examination without abnormal findings: Secondary | ICD-10-CM | POA: Diagnosis not present

## 2017-07-03 NOTE — Progress Notes (Signed)
Subjective:    Patient ID: Amber [name], female    DOB: 1970-08-25, 47 y.o.   MRN: [id number]  HPI   Patient presents today for complete physical.  Immunizations: tdap 2017 Diet: healthy Exercise: walks, some running, interval training, paddle boarding Pap Smear: 12/18- normal Mammogram: 1/19 Dental: up to date Vision: up to date   Review of Systems  Constitutional: Negative for unexpected weight change.  HENT: Negative for hearing loss and rhinorrhea.   Eyes: Negative for visual disturbance.  Respiratory: Negative for cough and shortness of breath.   Cardiovascular: Negative for chest pain and leg swelling.  Gastrointestinal: Negative for blood in stool, constipation and diarrhea.  Genitourinary: Negative for dysuria and frequency.  Musculoskeletal:       Use advil for neck/back pain  Skin: Negative for rash.  Neurological: Negative for headaches.  Hematological: Negative for adenopathy.  Psychiatric/Behavioral:       Denies depression/anxiety   History reviewed. No pertinent past medical history.   Social History   Socioeconomic History  . Marital status: Married    Spouse name: Not on file  . Number of children: 3  . Years of education: Not on file  . Highest education level: Not on file  Occupational History  . Occupation: Nurse P.  Social Needs  . Financial resource strain: Not hard at all  . Food insecurity:    Worry: Never true    Inability: Never true  . Transportation needs:    Medical: No    Non-medical: No  Tobacco Use  . Smoking status: Never Smoker  . Smokeless tobacco: Never Used  Substance and Sexual Activity  . Alcohol use: Not Currently  . Drug use: Never  . Sexual activity: Yes    Partners: Male  Lifestyle  . Physical activity:    Days per week: 3 days    Minutes per session: 40 min  . Stress: To some extent  Relationships  . Social connections:    Talks on phone: More than three times a week    Gets together: Twice a week    Attends religious service: More than 4 times per year    Active member of club or organization: No    Attends meetings of clubs or organizations: Never    Relationship status: Married  . Intimate partner violence:    Fear of current or ex partner: No    Emotionally abused: No    Physically abused: No    Forced sexual activity: No  Other Topics Concern  . Not on file  Social History Narrative  . Not on file    Past Surgical History:  Procedure Laterality Date  . BREAST BIOPSY Right 2019  . none      Family History  Problem Relation Age of Onset  . Breast cancer Paternal Aunt   . Arthritis Mother   . GER disease Mother   . Arthritis Sister     No Known Allergies  No current outpatient medications on file prior to visit.   No current facility-administered medications on file prior to visit.     BP (!) 166/66 (BP Location: Right Arm, Patient Position: Sitting, Cuff Size: Small)   Pulse 69   Temp 98.4 F (36.9 C) (Oral)   Resp 16   Ht 5\' 8"  (1.727 m)   Wt 134 lb 12.8 oz (61.1 kg)   SpO2 100%   BMI 20.50 kg/m        Objective:   Physical Exam  Physical Exam  Constitutional: She is oriented to person, place, and time. She appears well-developed and well-nourished. No distress.  HENT:  Head: Normocephalic and atraumatic.  Right Ear: Tympanic membrane and ear canal normal.  Left Ear: Tympanic membrane and ear canal normal.  Mouth/Throat: Oropharynx is clear and moist.  Eyes: Pupils are equal, round, and reactive to light. No scleral icterus.  Neck: Normal range of motion. No thyromegaly present.  Cardiovascular: Normal rate and regular rhythm.   No murmur heard. Pulmonary/Chest: Effort normal and breath sounds normal. No respiratory distress. He has no wheezes. She has no rales. She exhibits no tenderness.  Abdominal: Soft. Bowel sounds are normal. She exhibits no distension and no mass. There is no tenderness. There is no rebound and no guarding.    Musculoskeletal: She exhibits no edema.  Lymphadenopathy:    She has no cervical adenopathy.  Neurological: She is alert and oriented to person, place, and time. She has normal patellar reflexes. She exhibits normal muscle tone. Coordination normal.  Skin: Skin is warm and dry.  Psychiatric: She has a normal mood and affect. Her behavior is normal. Judgment and thought content normal.  Breast/pelvic: deferred          Assessment & Plan:   Preventative care- encouraged continued healthy diet/exercise. Had complete panel of lab work performed this winter which is reviewed. Tetanus up to date.  Pap/mammo up to date.  EKG tracing is personally reviewed.  EKG notes NSR.  No acute changes.        Assessment & Plan:

## 2017-07-03 NOTE — Patient Instructions (Signed)
Continue healthy diet and regular exercise.  Preventive Care 40-64 Years, Female Preventive care refers to lifestyle choices and visits with your health care provider that can promote health and wellness. What does preventive care include?  A yearly physical exam. This is also called an annual well check.  Dental exams once or twice a year.  Routine eye exams. Ask your health care provider how often you should have your eyes checked.  Personal lifestyle choices, including: ? Daily care of your teeth and gums. ? Regular physical activity. ? Eating a healthy diet. ? Avoiding tobacco and drug use. ? Limiting alcohol use. ? Practicing safe sex. ? Taking low-dose aspirin daily starting at age 65. ? Taking vitamin and mineral supplements as recommended by your health care provider. What happens during an annual well check? The services and screenings done by your health care provider during your annual well check will depend on your age, overall health, lifestyle risk factors, and family history of disease. Counseling Your health care provider may ask you questions about your:  Alcohol use.  Tobacco use.  Drug use.  Emotional well-being.  Home and relationship well-being.  Sexual activity.  Eating habits.  Work and work Statistician.  Method of birth control.  Menstrual cycle.  Pregnancy history.  Screening You may have the following tests or measurements:  Height, weight, and BMI.  Blood pressure.  Lipid and cholesterol levels. These may be checked every 5 years, or more frequently if you are over 65 years old.  Skin check.  Lung cancer screening. You may have this screening every year starting at age 74 if you have a 30-pack-year history of smoking and currently smoke or have quit within the past 15 years.  Fecal occult blood test (FOBT) of the stool. You may have this test every year starting at age 29.  Flexible sigmoidoscopy or colonoscopy. You may have a  sigmoidoscopy every 5 years or a colonoscopy every 10 years starting at age 39.  Hepatitis C blood test.  Hepatitis B blood test.  Sexually transmitted disease (STD) testing.  Diabetes screening. This is done by checking your blood sugar (glucose) after you have not eaten for a while (fasting). You may have this done every 1-3 years.  Mammogram. This may be done every 1-2 years. Talk to your health care provider about when you should start having regular mammograms. This may depend on whether you have a family history of breast cancer.  BRCA-related cancer screening. This may be done if you have a family history of breast, ovarian, tubal, or peritoneal cancers.  Pelvic exam and Pap test. This may be done every 3 years starting at age 69. Starting at age 41, this may be done every 5 years if you have a Pap test in combination with an HPV test.  Bone density scan. This is done to screen for osteoporosis. You may have this scan if you are at high risk for osteoporosis.  Discuss your test results, treatment options, and if necessary, the need for more tests with your health care provider. Vaccines Your health care provider may recommend certain vaccines, such as:  Influenza vaccine. This is recommended every year.  Tetanus, diphtheria, and acellular pertussis (Tdap, Td) vaccine. You may need a Td booster every 10 years.  Varicella vaccine. You may need this if you have not been vaccinated.  Zoster vaccine. You may need this after age 63.  Measles, mumps, and rubella (MMR) vaccine. You may need at least one dose  of MMR if you were born in 1957 or later. You may also need a second dose.  Pneumococcal 13-valent conjugate (PCV13) vaccine. You may need this if you have certain conditions and were not previously vaccinated.  Pneumococcal polysaccharide (PPSV23) vaccine. You may need one or two doses if you smoke cigarettes or if you have certain conditions.  Meningococcal vaccine. You may  need this if you have certain conditions.  Hepatitis A vaccine. You may need this if you have certain conditions or if you travel or work in places where you may be exposed to hepatitis A.  Hepatitis B vaccine. You may need this if you have certain conditions or if you travel or work in places where you may be exposed to hepatitis B.  Haemophilus influenzae type b (Hib) vaccine. You may need this if you have certain conditions.  Talk to your health care provider about which screenings and vaccines you need and how often you need them. This information is not intended to replace advice given to you by your health care provider. Make sure you discuss any questions you have with your health care provider. Document Released: 02/26/2015 Document Revised: 10/20/2015 Document Reviewed: 12/01/2014 Elsevier Interactive Patient Education  [employer].

## 2017-08-20 ENCOUNTER — Other Ambulatory Visit: Payer: Self-pay | Admitting: Obstetrics and Gynecology

## 2017-08-20 DIAGNOSIS — N6489 Other specified disorders of breast: Secondary | ICD-10-CM

## 2017-08-27 ENCOUNTER — Ambulatory Visit
Admission: RE | Admit: 2017-08-27 | Discharge: 2017-08-27 | Disposition: A | Discharge status: Home / Self Care | Payer: 59 | Source: Ambulatory Visit | Attending: Obstetrics and Gynecology | Admitting: Obstetrics and Gynecology

## 2017-08-27 ENCOUNTER — Other Ambulatory Visit: Payer: Self-pay | Admitting: Obstetrics and Gynecology

## 2017-08-27 DIAGNOSIS — N6320 Unspecified lump in the left breast, unspecified quadrant: Secondary | ICD-10-CM

## 2017-08-27 DIAGNOSIS — N6489 Other specified disorders of breast: Secondary | ICD-10-CM

## 2017-08-27 DIAGNOSIS — R921 Mammographic calcification found on diagnostic imaging of breast: Secondary | ICD-10-CM | POA: Diagnosis not present

## 2017-08-29 ENCOUNTER — Ambulatory Visit
Admission: RE | Admit: 2017-08-29 | Discharge: 2017-08-29 | Disposition: A | Discharge status: Home / Self Care | Payer: 59 | Source: Ambulatory Visit | Attending: Obstetrics and Gynecology | Admitting: Obstetrics and Gynecology

## 2017-08-29 ENCOUNTER — Other Ambulatory Visit: Payer: Self-pay | Admitting: Obstetrics and Gynecology

## 2017-08-29 DIAGNOSIS — N6320 Unspecified lump in the left breast, unspecified quadrant: Secondary | ICD-10-CM

## 2017-08-29 DIAGNOSIS — N6012 Diffuse cystic mastopathy of left breast: Secondary | ICD-10-CM | POA: Diagnosis not present

## 2017-08-29 DIAGNOSIS — R921 Mammographic calcification found on diagnostic imaging of breast: Secondary | ICD-10-CM | POA: Diagnosis not present

## 2017-11-13 ENCOUNTER — Ambulatory Visit (INDEPENDENT_AMBULATORY_CARE_PROVIDER_SITE_OTHER): Payer: 59 | Admitting: Internal Medicine

## 2017-11-13 ENCOUNTER — Encounter: Payer: Self-pay | Admitting: Internal Medicine

## 2017-11-13 VITALS — BP 110/64 | HR 76 | Ht 68.0 in | Wt 135.0 lb

## 2017-11-13 DIAGNOSIS — G4733 Obstructive sleep apnea (adult) (pediatric): Secondary | ICD-10-CM | POA: Insufficient documentation

## 2017-11-13 NOTE — Patient Instructions (Signed)
Order- new DME, new CPAP auto 5-20, mask of choice, humidifier, supplies, AirView Schedule f/u office visit in 31-90 days

## 2017-11-13 NOTE — Assessment & Plan Note (Signed)
Moderate obstructive sleep apnea consistent with history and symptoms but not expected from physical exam.  It may be that nasal congestion causes mouth breathing while supine at night.  She has a good understanding of the basics of OSA and available treatment options.She chooses CPAP Plan start auto CPAP

## 2017-11-13 NOTE — Addendum Note (Signed)
Addended by: Clayborne Dana C on: 11/13/2017 03:26 PM   Modules accepted: Orders

## 2017-11-13 NOTE — Progress Notes (Signed)
11/13/2017-47 year old female never smoker for sleep evaluation, self-referral.  Husband tells her she snores loudly with witnessed apneas.  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 Negative family history of related problems.  No history of ENT surgery, heart or lung problems. No sleep medication.  Occasional coffee. Wakes with dry mouth and is aware of some nasal stuffiness which she thinks might cause mouth breathing. HST 11/12/2017-AHI 17.2/hour, desaturation to 80% with average 95%, body weight 135 pounds  Prior to Admission medications   Not on File   No past medical history on file. Past Surgical History:  Procedure Laterality Date  . BREAST BIOPSY Right 2019  . none     Family History  Problem Relation Age of Onset  . Breast cancer Paternal Aunt   . Arthritis Mother   . GER disease Mother   . Arthritis Sister    Social History   Socioeconomic History  . Marital status: Married    Spouse name: Not on file  . Number of children: 3  . Years of education: Not on file  . Highest education level: Not on file  Occupational History  . Occupation: Nurse P.  Social Needs  . Financial resource strain: Not hard at all  . Food insecurity:    Worry: Never true    Inability: Never true  . Transportation needs:    Medical: No    Non-medical: No  Tobacco Use  . Smoking status: Never Smoker  . Smokeless tobacco: Never Used  Substance and Sexual Activity  . Alcohol use: Not Currently  . Drug use: Never  . Sexual activity: Yes    Partners: Male  Lifestyle  . Physical activity:    Days per week: 3 days    Minutes per session: 40 min  . Stress: To some extent  Relationships  . Social connections:    Talks on phone: More than three times a week    Gets together: Twice a week    Attends religious service: More than 4 times per year    Active member of club or organization: No    Attends meetings of clubs or  organizations: Never    Relationship status: Married  . Intimate partner violence:    Fear of current or ex partner: No    Emotionally abused: No    Physically abused: No    Forced sexual activity: No  Other Topics Concern  . Not on file  Social History Narrative  . Not on file   ROS-see HPI   + = positive Constitutional:    weight loss, night sweats, fevers, chills, +fatigue, lassitude. HEENT:    headaches, difficulty swallowing, tooth/dental problems, sore throat,       sneezing, itching, ear ache, nasal congestion, post nasal drip, +snoring CV:    chest pain, orthopnea, PND, swelling in lower extremities, anasarca,                                  dizziness, palpitations Resp:   shortness of breath with exertion or at rest.                productive cough,   non-productive cough, coughing up of blood.              change in color of mucus.  wheezing.   Skin:    rash or lesions. GI:  No-  heartburn, indigestion, abdominal pain, nausea, vomiting, diarrhea,                 change in bowel habits, loss of appetite GU: dysuria, change in color of urine, no urgency or frequency.   flank pain. MS:   joint pain, stiffness, decreased range of motion, back pain. Neuro-     nothing unusual Psych:  change in mood or affect.  depression or anxiety.   memory loss.  OBJ- Physical Exam General- Alert, Oriented, Affect-appropriate, Distress- none acute, +trim Skin- rash-none, lesions- none, excoriation- none Lymphadenopathy- none Head- atraumatic            Eyes- Gross vision intact, PERRLA, conjunctivae and secretions clear            Ears- Hearing, canals-normal            Nose- Clear, no-Septal dev, mucus, polyps, erosion, perforation             Throat- Mallampati II , mucosa clear , drainage- none, tonsils- atrophic Neck- flexible , trachea midline, no stridor , thyroid nl, carotid no bruit Chest - symmetrical excursion , unlabored           Heart/CV- RRR , no murmur , no gallop  , no  rub, nl s1 s2                           - JVD- none , edema- none, stasis changes- none, varices- none           Lung- clear to P&A, wheeze- none, cough- none , dullness-none, rub- none           Chest wall-  Abd-  Br/ Gen/ Rectal- Not done, not indicated Extrem- cyanosis- none, clubbing, none, atrophy- none, strength- nl Neuro- grossly intact to observation

## 2017-11-15 NOTE — Addendum Note (Signed)
Addended by: Parke Poisson E on: 11/15/2017 05:16 PM   Modules accepted: Orders

## 2017-11-16 DIAGNOSIS — G4733 Obstructive sleep apnea (adult) (pediatric): Secondary | ICD-10-CM | POA: Diagnosis not present

## 2017-11-16 NOTE — Addendum Note (Signed)
Addended by: Parke Poisson E on: 11/16/2017 10:59 AM   Modules accepted: Orders

## 2017-11-20 ENCOUNTER — Telehealth: Payer: Self-pay | Admitting: Internal Medicine

## 2017-11-20 ENCOUNTER — Other Ambulatory Visit: Payer: Self-pay | Admitting: Internal Medicine

## 2017-11-20 DIAGNOSIS — R0981 Nasal congestion: Secondary | ICD-10-CM

## 2017-11-20 DIAGNOSIS — G4733 Obstructive sleep apnea (adult) (pediatric): Secondary | ICD-10-CM

## 2017-11-20 NOTE — Telephone Encounter (Signed)
Pt is aware of change to pressure and order has been placed.

## 2017-11-21 ENCOUNTER — Telehealth: Payer: Self-pay | Admitting: Internal Medicine

## 2017-11-21 DIAGNOSIS — G4733 Obstructive sleep apnea (adult) (pediatric): Secondary | ICD-10-CM

## 2017-11-21 NOTE — Telephone Encounter (Signed)
Patient would like to switch CPAP pressure settings to 5cm  Per CY: let's try auto 4-8cm.  This may be more comfortable.  Patient is aware of CY's recommendations Order placed

## 2017-11-27 DIAGNOSIS — J342 Deviated nasal septum: Secondary | ICD-10-CM | POA: Diagnosis not present

## 2017-11-27 DIAGNOSIS — J3489 Other specified disorders of nose and nasal sinuses: Secondary | ICD-10-CM | POA: Diagnosis not present

## 2017-11-27 DIAGNOSIS — R0981 Nasal congestion: Secondary | ICD-10-CM | POA: Diagnosis not present

## 2017-12-17 DIAGNOSIS — G4733 Obstructive sleep apnea (adult) (pediatric): Secondary | ICD-10-CM | POA: Diagnosis not present

## 2017-12-20 DIAGNOSIS — R0981 Nasal congestion: Secondary | ICD-10-CM | POA: Diagnosis not present

## 2017-12-20 DIAGNOSIS — J342 Deviated nasal septum: Secondary | ICD-10-CM | POA: Diagnosis not present

## 2017-12-20 DIAGNOSIS — G4733 Obstructive sleep apnea (adult) (pediatric): Secondary | ICD-10-CM | POA: Diagnosis not present

## 2017-12-20 DIAGNOSIS — J343 Hypertrophy of nasal turbinates: Secondary | ICD-10-CM | POA: Diagnosis not present

## 2017-12-20 DIAGNOSIS — J3489 Other specified disorders of nose and nasal sinuses: Secondary | ICD-10-CM | POA: Diagnosis not present

## 2018-01-16 DIAGNOSIS — G4733 Obstructive sleep apnea (adult) (pediatric): Secondary | ICD-10-CM | POA: Diagnosis not present

## 2018-02-16 DIAGNOSIS — G4733 Obstructive sleep apnea (adult) (pediatric): Secondary | ICD-10-CM | POA: Diagnosis not present

## 2018-02-25 ENCOUNTER — Other Ambulatory Visit: Payer: Self-pay | Admitting: Obstetrics and Gynecology

## 2018-02-25 DIAGNOSIS — N6310 Unspecified lump in the right breast, unspecified quadrant: Secondary | ICD-10-CM

## 2018-03-08 ENCOUNTER — Ambulatory Visit
Admission: RE | Admit: 2018-03-08 | Discharge: 2018-03-08 | Disposition: A | Discharge status: Home / Self Care | Payer: 59 | Source: Ambulatory Visit | Attending: Obstetrics and Gynecology | Admitting: Obstetrics and Gynecology

## 2018-03-08 DIAGNOSIS — R922 Inconclusive mammogram: Secondary | ICD-10-CM | POA: Diagnosis not present

## 2018-03-08 DIAGNOSIS — N6310 Unspecified lump in the right breast, unspecified quadrant: Secondary | ICD-10-CM

## 2018-03-08 DIAGNOSIS — N6311 Unspecified lump in the right breast, upper outer quadrant: Secondary | ICD-10-CM | POA: Diagnosis not present

## 2018-03-11 DIAGNOSIS — Z01419 Encounter for gynecological examination (general) (routine) without abnormal findings: Secondary | ICD-10-CM | POA: Diagnosis not present

## 2018-03-11 DIAGNOSIS — Z6821 Body mass index (BMI) 21.0-21.9, adult: Secondary | ICD-10-CM | POA: Diagnosis not present

## 2018-03-11 DIAGNOSIS — Z130 Encounter for screening for diseases of the blood and blood-forming organs and certain disorders involving the immune mechanism: Secondary | ICD-10-CM | POA: Diagnosis not present

## 2018-03-11 DIAGNOSIS — Z1389 Encounter for screening for other disorder: Secondary | ICD-10-CM | POA: Diagnosis not present

## 2018-04-08 DIAGNOSIS — G4733 Obstructive sleep apnea (adult) (pediatric): Secondary | ICD-10-CM | POA: Diagnosis not present

## 2018-04-12 ENCOUNTER — Other Ambulatory Visit: Payer: 59

## 2018-04-12 ENCOUNTER — Other Ambulatory Visit: Payer: Self-pay | Admitting: Internal Medicine

## 2018-04-12 DIAGNOSIS — J029 Acute pharyngitis, unspecified: Secondary | ICD-10-CM

## 2018-04-12 LAB — STREP COMPLETE PANEL
STREP DYSGALACTIAE: NEGATIVE
Strep Pyogenes: NEGATIVE

## 2018-04-12 MED ORDER — AZITHROMYCIN 250 MG PO TABS: ORAL_TABLET | ORAL | 0 refills | Status: DC

## 2018-05-21 DIAGNOSIS — G4733 Obstructive sleep apnea (adult) (pediatric): Secondary | ICD-10-CM | POA: Diagnosis not present

## 2018-06-04 ENCOUNTER — Other Ambulatory Visit: Payer: Self-pay | Admitting: Internal Medicine

## 2018-06-04 DIAGNOSIS — R6889 Other general symptoms and signs: Secondary | ICD-10-CM

## 2018-06-17 ENCOUNTER — Ambulatory Visit (INDEPENDENT_AMBULATORY_CARE_PROVIDER_SITE_OTHER): Payer: 59 | Admitting: Acute Care

## 2018-06-17 ENCOUNTER — Encounter: Payer: Self-pay | Admitting: Acute Care

## 2018-06-17 ENCOUNTER — Other Ambulatory Visit: Payer: Self-pay | Admitting: Acute Care

## 2018-06-17 ENCOUNTER — Other Ambulatory Visit: Payer: 59

## 2018-06-17 ENCOUNTER — Other Ambulatory Visit: Payer: Self-pay

## 2018-06-17 VITALS — BP 110/52 | HR 88 | Temp 98.5°F

## 2018-06-17 DIAGNOSIS — R102 Pelvic and perineal pain: Secondary | ICD-10-CM | POA: Diagnosis not present

## 2018-06-17 DIAGNOSIS — R109 Unspecified abdominal pain: Secondary | ICD-10-CM | POA: Diagnosis not present

## 2018-06-17 LAB — URINALYSIS, ROUTINE W REFLEX MICROSCOPIC
Bilirubin Urine: NEGATIVE
Hgb urine dipstick: NEGATIVE
Ketones, ur: NEGATIVE
Leukocytes,Ua: NEGATIVE
Nitrite: NEGATIVE
RBC / HPF: NONE SEEN (ref 0–?)
Specific Gravity, Urine: <=1.005 — AB (ref 1.000–1.030)
Total Protein, Urine: NEGATIVE
Urine Glucose: NEGATIVE
Urobilinogen, UA: 0.2 (ref 0.0–1.0)
WBC, UA: NONE SEEN (ref 0–?)
pH: 6.5 (ref 5.0–8.0)

## 2018-06-17 LAB — COMPREHENSIVE METABOLIC PANEL
ALT: 15 U/L (ref 0–35)
AST: 19 U/L (ref 0–37)
Albumin: 4.6 g/dL (ref 3.5–5.2)
Alkaline Phosphatase: 46 U/L (ref 39–117)
BUN: 14 mg/dL (ref 6–23)
CO2: 25 mEq/L (ref 19–32)
Calcium: 9.4 mg/dL (ref 8.4–10.5)
Chloride: 103 mEq/L (ref 96–112)
Creatinine, Ser: 0.95 mg/dL (ref 0.40–1.20)
GFR: 63.05 mL/min (ref >60.00)
Glucose, Bld: 101 mg/dL — ABNORMAL HIGH (ref 70–99)
Potassium: 3.5 mEq/L (ref 3.5–5.1)
Sodium: 137 mEq/L (ref 135–145)
Total Bilirubin: 0.4 mg/dL (ref 0.2–1.2)
Total Protein: 7.3 g/dL (ref 6.0–8.3)

## 2018-06-17 LAB — CBC
HCT: 40.4 % (ref 36.0–46.0)
Hemoglobin: 13.8 g/dL (ref 12.0–15.0)
MCHC: 34.2 g/dL (ref 30.0–36.0)
MCV: 88.3 fl (ref 78.0–100.0)
Platelets: 197 10*3/uL (ref 150.0–400.0)
RBC: 4.58 Mil/uL (ref 3.87–5.11)
RDW: 13 % (ref 11.5–15.5)
WBC: 8 10*3/uL (ref 4.0–10.5)

## 2018-06-17 NOTE — Assessment & Plan Note (Addendum)
Abdominal Supra pubic pain of 5 day duration Has taken Bactrim x 4 days BID without relief. Plan: CBC CMET Urine Culture UA Referral to Gyn Dr. Marvel Plan in the morning AT 08:30. Ibuprofen 600 mg Q 8 for pain Alternate with Tylenol 500 q 8 Meletonin tonight for sleep Force fluids Rest Follow up Micro UA Urine Culture Consider CT abdomen Pelvis Consider US abdomen

## 2018-06-17 NOTE — Progress Notes (Signed)
History of Present Illness Amber [name] is a 48 y.o. female with no significant PMH . She has had 3 full term pregnancies.  06/17/2018  Pt presents today with 5 day history of suprapubic pain and tenderness. Urinary urgency with slight frequency. No fever, no back pain. Urine has been cloudy, no odor. She did take Azo with no relief.She has had worsening fatigue, difficulty sleeping as sitting and supine positioning make it worse. She had some Bactrim at home and has been taking it for 4 days BID without improvement. She states she actually feels worse. She has very low suspicion of pregnancy. She has tenderness with deep palpation to the suprapubic region, left of umbilicus. Upon deep palpation I feel an area that feels like a  Muscle in spasm that is tender to palpation ( Rectus abdominal muscle).She has no vaginal discharge or odor.She feels her abdomen is distended.No flank pain upon exam.No visible hernia  Test Results: UA 06/17/2018 >> No Nitrites, no blood, no WBC, SG 1.005  CBC    Component Value Date/Time   WBC 8.0 06/17/2018 1544   RBC 4.58 06/17/2018 1544   HGB 13.8 06/17/2018 1544   HCT 40.4 06/17/2018 1544   PLT 197.0 06/17/2018 1544   MCV 88.3 06/17/2018 1544   MCHC 34.2 06/17/2018 1544   RDW 13.0 06/17/2018 1544   LYMPHSABS 0.7 02/16/2017 1212   MONOABS 0.4 02/16/2017 1212   EOSABS 0.0 02/16/2017 1212   BASOSABS 0.0 02/16/2017 1212    CBC Latest Ref Rng & Units 06/17/2018 02/16/2017  WBC 4.0 - 10.5 K/uL 8.0 8.0  Hemoglobin 12.0 - 15.0 g/dL 13.8 14.5  Hematocrit 36.0 - 46.0 % 40.4 43.8  Platelets 150.0 - 400.0 K/uL 197.0 227.0    BMP Latest Ref Rng & Units 06/17/2018 02/16/2017  Glucose 70 - 99 mg/dL 101(H) 100(H)  BUN 6 - 23 mg/dL 14 14  Creatinine 0.40 - 1.20 mg/dL 0.95 0.73  Sodium 135 - 145 mEq/L 137 140  Potassium 3.5 - 5.1 mEq/L 3.5 4.5  Chloride 96 - 112 mEq/L 103 103  CO2 19 - 32 mEq/L 25 30  Calcium 8.4 - 10.5 mg/dL 9.4 9.8   Hepatic Function Latest Ref  Rng & Units 06/17/2018 02/16/2017  Total Protein 6.0 - 8.3 g/dL 7.3 7.3  Albumin 3.5 - 5.2 g/dL 4.6 4.6  AST 0 - 37 U/L 19 17  ALT 0 - 35 U/L 15 16  Alk Phosphatase 39 - 117 U/L 46 39  Total Bilirubin 0.2 - 1.2 mg/dL 0.4 0.5  Bilirubin, Direct 0.0 - 0.3 mg/dL - 0.1   Past medical hx No past medical history on file.   Social History   Tobacco Use  . Smoking status: Never Smoker  . Smokeless tobacco: Never Used  Substance Use Topics  . Alcohol use: Not Currently  . Drug use: Never    [name] reports that she has never smoked. She has never used smokeless tobacco. She reports previous alcohol use. She reports that she does not use drugs.  Tobacco Cessation: Never smoker  Past surgical hx, Family hx, Social hx all reviewed.  Current Outpatient Medications on File Prior to Visit  Medication Sig  . azithromycin (ZITHROMAX) 250 MG tablet Take as directed   No current facility-administered medications on file prior to visit.      No Known Allergies  Review Of Systems:  Constitutional:   No  weight loss, night sweats,  Fevers, chills, fatigue, or  lassitude.  HEENT:  No headaches,  Difficulty swallowing,  Tooth/dental problems, or  Sore throat,                No sneezing, itching, ear ache, nasal congestion, post nasal drip,   CV:  No chest pain,  Orthopnea, PND, swelling in lower extremities, anasarca, dizziness, palpitations, syncope.   GI  No heartburn, indigestion, + abdominal pain, no nausea, vomiting, diarrhea, change in bowel habits, + loss of appetite, bloody stools.   Resp: No shortness of breath with exertion or at rest.  No excess mucus, no productive cough,  No non-productive cough,  No coughing up of blood.  No change in color of mucus.  No wheezing.  No chest wall deformity  Skin: no rash or lesions.  GU: no dysuria, + change in color of urine, + urgency or frequency.  No flank pain, no hematuria   MS:  No joint pain or swelling.  No decreased range of  motion.  No back pain.  Psych:  No change in mood or affect. No depression or anxiety.  No memory loss.   Vital Signs BP (!) 110/52   Pulse 88   Temp 98.5 F (36.9 C)   SpO2 98%    Physical Exam:  General- No distress,  A&Ox3, MAE X 4 ENT: No sinus tenderness, TM clear, pale nasal mucosa, no oral exudate,no post nasal drip, no LAN Cardiac: S1, S2, regular rate and rhythm, no murmur Chest: No wheeze/ rales/ dullness; no accessory muscle use, no nasal flaring, no sternal retractions Abd : Soft , slightly distended, tender to deep palpation over L suprapubic area. GU: Urgency and frequency , change in color Ext: No clubbing cyanosis, edema Neuro:  normal strength, A&O x 3, MAE x 4 Skin: No rashes, warm and dry Psych: normal mood and behavior   Assessment/Plan Abdominal pain Abdominal Supra pubic pain of 5 day duration Has taken Bactrim x 4 days BID without relief. Plan: CBC CMET Urine Culture UA Referral to Gyn Dr. Marvel Plan in the morning AT 08:30. Ibuprofen 600 mg Q 8 for pain Alternate with Tylenol 500 q 8 Meletonin tonight for sleep Force fluids Rest Follow up Micro UA Urine Culture Consider CT abdomen Pelvis Consider US abdomen      Magdalen Spatz, NP 06/17/2018  4:39 PM

## 2018-06-18 DIAGNOSIS — N898 Other specified noninflammatory disorders of vagina: Secondary | ICD-10-CM | POA: Diagnosis not present

## 2018-06-18 DIAGNOSIS — R102 Pelvic and perineal pain: Secondary | ICD-10-CM | POA: Diagnosis not present

## 2018-06-19 LAB — URINE CULTURE
MICRO NUMBER:: 442639
Result:: NO GROWTH
SPECIMEN QUALITY:: ADEQUATE

## 2018-08-27 ENCOUNTER — Other Ambulatory Visit: Payer: Self-pay | Admitting: Obstetrics and Gynecology

## 2018-08-27 DIAGNOSIS — Z1231 Encounter for screening mammogram for malignant neoplasm of breast: Secondary | ICD-10-CM

## 2018-10-07 DIAGNOSIS — G4733 Obstructive sleep apnea (adult) (pediatric): Secondary | ICD-10-CM | POA: Diagnosis not present

## 2018-10-10 ENCOUNTER — Other Ambulatory Visit: Payer: Self-pay

## 2018-10-10 ENCOUNTER — Ambulatory Visit
Admission: RE | Admit: 2018-10-10 | Discharge: 2018-10-10 | Disposition: A | Discharge status: Home / Self Care | Payer: 59 | Source: Ambulatory Visit | Attending: Obstetrics and Gynecology | Admitting: Obstetrics and Gynecology

## 2018-10-10 DIAGNOSIS — Z1231 Encounter for screening mammogram for malignant neoplasm of breast: Secondary | ICD-10-CM | POA: Diagnosis not present

## 2018-11-12 DIAGNOSIS — L988 Other specified disorders of the skin and subcutaneous tissue: Secondary | ICD-10-CM | POA: Diagnosis not present

## 2018-11-14 ENCOUNTER — Other Ambulatory Visit: Payer: Self-pay | Admitting: Occupational Medicine

## 2018-11-15 LAB — COMPLETE METABOLIC PANEL WITH GFR
AG Ratio: 1.8 (calc) (ref 1.0–2.5)
ALT: 12 U/L (ref 6–29)
AST: 16 U/L (ref 10–35)
Albumin: 4.2 g/dL (ref 3.6–5.1)
Alkaline phosphatase (APISO): 39 U/L (ref 31–125)
BUN: 12 mg/dL (ref 7–25)
CO2: 27 mmol/L (ref 20–32)
Calcium: 9.4 mg/dL (ref 8.6–10.2)
Chloride: 105 mmol/L (ref 98–110)
Creat: 0.84 mg/dL (ref 0.50–1.10)
GFR, Est African American: 96 mL/min/{1.73_m2} (ref >=60)
GFR, Est Non African American: 83 mL/min/{1.73_m2} (ref >=60)
Globulin: 2.4 g/dL (calc) (ref 1.9–3.7)
Glucose, Bld: 97 mg/dL (ref 65–99)
Potassium: 4.1 mmol/L (ref 3.5–5.3)
Sodium: 140 mmol/L (ref 135–146)
Total Bilirubin: 0.5 mg/dL (ref 0.2–1.2)
Total Protein: 6.6 g/dL (ref 6.1–8.1)

## 2018-11-15 LAB — LIPID PANEL
Cholesterol: 150 mg/dL (ref <200)
HDL: 71 mg/dL (ref >=50)
LDL Cholesterol (Calc): 66 mg/dL (calc)
Non-HDL Cholesterol (Calc): 79 mg/dL (calc) (ref <130)
Total CHOL/HDL Ratio: 2.1 (calc) (ref <5.0)
Triglycerides: 54 mg/dL (ref <150)

## 2018-11-15 LAB — CBC WITH DIFFERENTIAL/PLATELET
Absolute Monocytes: 624 cells/uL (ref 200–950)
Basophils Absolute: 48 cells/uL (ref 0–200)
Basophils Relative: 1 %
Eosinophils Absolute: 110 cells/uL (ref 15–500)
Eosinophils Relative: 2.3 %
HCT: 40 % (ref 35.0–45.0)
Hemoglobin: 12.9 g/dL (ref 11.7–15.5)
Lymphs Abs: 1262 cells/uL (ref 850–3900)
MCH: 28.9 pg (ref 27.0–33.0)
MCHC: 32.3 g/dL (ref 32.0–36.0)
MCV: 89.7 fL (ref 80.0–100.0)
MPV: 10.8 fL (ref 7.5–12.5)
Monocytes Relative: 13 %
Neutro Abs: 2755 cells/uL (ref 1500–7800)
Neutrophils Relative %: 57.4 %
Platelets: 213 10*3/uL (ref 140–400)
RBC: 4.46 10*6/uL (ref 3.80–5.10)
RDW: 11.9 % (ref 11.0–15.0)
Total Lymphocyte: 26.3 %
WBC: 4.8 10*3/uL (ref 3.8–10.8)

## 2018-11-15 LAB — TSH: TSH: 0.8 mIU/L

## 2019-01-16 DIAGNOSIS — Z135 Encounter for screening for eye and ear disorders: Secondary | ICD-10-CM | POA: Diagnosis not present

## 2019-01-16 DIAGNOSIS — H5213 Myopia, bilateral: Secondary | ICD-10-CM | POA: Diagnosis not present

## 2019-01-16 DIAGNOSIS — H524 Presbyopia: Secondary | ICD-10-CM | POA: Diagnosis not present

## 2019-01-29 DIAGNOSIS — G4733 Obstructive sleep apnea (adult) (pediatric): Secondary | ICD-10-CM | POA: Diagnosis not present

## 2019-02-14 DIAGNOSIS — C50919 Malignant neoplasm of unspecified site of unspecified female breast: Secondary | ICD-10-CM

## 2019-02-14 HISTORY — DX: Malignant neoplasm of unspecified site of unspecified female breast: C50.919

## 2019-06-09 DIAGNOSIS — Z01419 Encounter for gynecological examination (general) (routine) without abnormal findings: Secondary | ICD-10-CM | POA: Diagnosis not present

## 2019-06-09 DIAGNOSIS — Z130 Encounter for screening for diseases of the blood and blood-forming organs and certain disorders involving the immune mechanism: Secondary | ICD-10-CM | POA: Diagnosis not present

## 2019-06-09 DIAGNOSIS — Z1389 Encounter for screening for other disorder: Secondary | ICD-10-CM | POA: Diagnosis not present

## 2019-06-09 DIAGNOSIS — Z6821 Body mass index (BMI) 21.0-21.9, adult: Secondary | ICD-10-CM | POA: Diagnosis not present

## 2019-09-28 ENCOUNTER — Other Ambulatory Visit: Payer: Self-pay | Admitting: Acute Care

## 2019-09-28 DIAGNOSIS — N3000 Acute cystitis without hematuria: Secondary | ICD-10-CM

## 2019-09-28 MED ORDER — CEPHALEXIN 500 MG PO CAPS: 500.0000 mg | ORAL_CAPSULE | Freq: Two times a day (BID) | ORAL | 0 refills | Status: AC

## 2019-09-29 ENCOUNTER — Other Ambulatory Visit: Payer: Self-pay | Admitting: Obstetrics and Gynecology

## 2019-09-29 DIAGNOSIS — Z1231 Encounter for screening mammogram for malignant neoplasm of breast: Secondary | ICD-10-CM

## 2019-10-15 ENCOUNTER — Ambulatory Visit: Payer: 59

## 2019-10-23 ENCOUNTER — Other Ambulatory Visit: Payer: Self-pay | Admitting: Primary Care

## 2019-10-23 ENCOUNTER — Other Ambulatory Visit: Payer: 59

## 2019-10-23 ENCOUNTER — Other Ambulatory Visit: Payer: Self-pay | Admitting: *Deleted

## 2019-10-23 DIAGNOSIS — Z0000 Encounter for general adult medical examination without abnormal findings: Secondary | ICD-10-CM

## 2019-10-23 NOTE — Addendum Note (Signed)
Addended by: Suzzanne Cloud E on: 10/23/2019 04:01 PM   Modules accepted: Orders

## 2019-10-24 LAB — SARS-COV-2 ANTIBODY(IGG)SPIKE,SEMI-QUANTITATIVE: SARS COV1 AB(IGG)SPIKE,SEMI QN: 3.27 index — ABNORMAL HIGH (ref <1.00)

## 2019-10-28 ENCOUNTER — Ambulatory Visit
Admission: RE | Admit: 2019-10-28 | Discharge: 2019-10-28 | Disposition: A | Discharge status: Home / Self Care | Payer: 59 | Source: Ambulatory Visit | Attending: Obstetrics and Gynecology | Admitting: Obstetrics and Gynecology

## 2019-10-28 ENCOUNTER — Other Ambulatory Visit: Payer: Self-pay

## 2019-10-28 DIAGNOSIS — Z1231 Encounter for screening mammogram for malignant neoplasm of breast: Secondary | ICD-10-CM | POA: Diagnosis not present

## 2019-10-30 ENCOUNTER — Other Ambulatory Visit: Payer: Self-pay | Admitting: Obstetrics and Gynecology

## 2019-10-30 DIAGNOSIS — R928 Other abnormal and inconclusive findings on diagnostic imaging of breast: Secondary | ICD-10-CM

## 2019-11-07 ENCOUNTER — Ambulatory Visit: Payer: 59 | Attending: Internal Medicine

## 2019-11-07 DIAGNOSIS — Z23 Encounter for immunization: Secondary | ICD-10-CM

## 2019-11-07 NOTE — Progress Notes (Signed)
° °  Cov[facility] Vaccination [facility]  Name:  Amber [name]    MRN: [id number] DOB: 04-Jun-1970  11/07/2019  Ms. Amber [name] was observed post Covid-19 immunization for 15 minutes without incident. She was provided with Vaccine Information Sheet and instruction to access the V-Safe system. Vaccinated by [employer].  Ms. Amber [name] was instructed to call 911 with any severe reactions post vaccine:  Difficulty breathing   Swelling of face and throat   A fast heartbeat   A bad rash all over body   Dizziness and weakness

## 2019-11-11 ENCOUNTER — Ambulatory Visit
Admission: RE | Admit: 2019-11-11 | Discharge: 2019-11-11 | Disposition: A | Discharge status: Home / Self Care | Payer: 59 | Source: Ambulatory Visit | Attending: Obstetrics and Gynecology | Admitting: Obstetrics and Gynecology

## 2019-11-11 ENCOUNTER — Other Ambulatory Visit: Payer: Self-pay | Admitting: Obstetrics and Gynecology

## 2019-11-11 ENCOUNTER — Other Ambulatory Visit: Payer: Self-pay

## 2019-11-11 DIAGNOSIS — N6311 Unspecified lump in the right breast, upper outer quadrant: Secondary | ICD-10-CM | POA: Diagnosis not present

## 2019-11-11 DIAGNOSIS — R928 Other abnormal and inconclusive findings on diagnostic imaging of breast: Secondary | ICD-10-CM

## 2019-11-11 DIAGNOSIS — N6310 Unspecified lump in the right breast, unspecified quadrant: Secondary | ICD-10-CM

## 2019-11-11 DIAGNOSIS — R922 Inconclusive mammogram: Secondary | ICD-10-CM | POA: Diagnosis not present

## 2019-11-20 ENCOUNTER — Other Ambulatory Visit: Payer: Self-pay

## 2019-11-20 ENCOUNTER — Ambulatory Visit
Admission: RE | Admit: 2019-11-20 | Discharge: 2019-11-20 | Disposition: A | Discharge status: Home / Self Care | Payer: 59 | Source: Ambulatory Visit | Attending: Obstetrics and Gynecology | Admitting: Obstetrics and Gynecology

## 2019-11-20 DIAGNOSIS — N6310 Unspecified lump in the right breast, unspecified quadrant: Secondary | ICD-10-CM

## 2019-11-20 DIAGNOSIS — R928 Other abnormal and inconclusive findings on diagnostic imaging of breast: Secondary | ICD-10-CM | POA: Diagnosis not present

## 2019-11-20 DIAGNOSIS — C50411 Malignant neoplasm of upper-outer quadrant of right female breast: Secondary | ICD-10-CM | POA: Diagnosis not present

## 2019-11-20 HISTORY — PX: BREAST BIOPSY: SHX20

## 2019-11-21 ENCOUNTER — Telehealth: Payer: Self-pay | Admitting: Oncology

## 2019-11-21 NOTE — Telephone Encounter (Signed)
Received a new pt referral from Dr. Donne Hazel for dx of breast cancer. Pt has been cld and scheduled to see Dr. Jana Hakim on 10/13 at 3pm w/labs at 330pm

## 2019-11-23 NOTE — Progress Notes (Signed)
Radiation Oncology         [phone] ________________________________  Initial Outpatient Consultation  Name: Amber [name] MRN: [id number]  Date: 11/24/2019  DOB: 12-Mar-1970  DQ:[id number], Juliann Pulse, MD  Rolm Bookbinder, MD   REFERRING PHYSICIAN: Rolm Bookbinder, MD  DIAGNOSIS: The encounter diagnosis was Malignant neoplasm of upper-outer quadrant of right female breast, unspecified estrogen receptor status ([place]).  Clinical Stage I (T1c, Nx) Right Breast UOQ, Invasive Ductal Carcinoma with DCIS, ER/PR/Her2 pending, Grade 1  HISTORY OF PRESENT ILLNESS::Amber [name] is a very pleasant 49 y.o. female who is seen as a courtesy of Dr. Donne Hazel for an opinion concerning radiation therapy as part of management for her recently diagnosed breast cancer.  She had routine screening mammography on 10/28/2019 that showed a possible asymmetry in the right breast. She then underwent unilateral diagnostic mammography and right breast ultrasonography on 11/11/2019 that showed resolution of the questioned asymmetry with additional imaging compatible with dense fibroglandular tissue. 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  Biopsy on 11/20/2019 showed grade 1 invasive ductal carcinoma with ductal carcinoma in situ. Prognostic indicators are pending.  PREVIOUS RADIATION THERAPY: No  PAST MEDICAL HISTORY: History reviewed. No pertinent past medical history.  PAST SURGICAL HISTORY: Past Surgical History:  Procedure Laterality Date  . BREAST BIOPSY Right 2019  . none      FAMILY HISTORY:  Family History  Problem Relation Age of Onset  . Breast cancer Paternal Aunt   . Arthritis Mother   . GER disease Mother   . Arthritis Sister     SOCIAL HISTORY:  Social History   Tobacco Use  . Smoking status: Never Smoker  . Smokeless tobacco: Never Used    Vaping Use  . Vaping Use: Never used  Substance Use Topics  . Alcohol use: Not Currently  . Drug use: Never  Nurse practitioner with pulmonary medicine, in practice approximately 20 years  ALLERGIES: No Known Allergies  MEDICATIONS:  Current Outpatient Medications  Medication Sig Dispense Refill  . azithromycin (ZITHROMAX) 250 MG tablet Take as directed 6 tablet 0   No current facility-administered medications for this encounter.    REVIEW OF SYSTEMS:  A 10+ POINT REVIEW OF SYSTEMS WAS OBTAINED including neurology, dermatology, psychiatry, cardiac, respiratory, lymph, extremities, GI, GU, musculoskeletal, constitutional, reproductive, HEENT.  Prior to diagnosis patient denied any pain within the breast area nipple discharge or bleeding   PHYSICAL EXAM:  height is '5\' 8"'  (1.727 m) and weight is 137 lb 3.2 oz (62.2 kg). Her oral temperature is 98.6 F (37 C). Her blood pressure is 131/80 and her pulse is 95.   General: Alert and oriented, in no acute distress HEENT: Head is normocephalic. Extraocular movements are intact.  Neck: Neck is supple, no palpable cervical or supraclavicular lymphadenopathy. Heart: Regular in rate and rhythm with no murmurs, rubs, or gallops. Chest: Clear to auscultation bilaterally, with no rhonchi, wheezes, or rales. Abdomen: Soft, nontender, nondistended, with no rigidity or guarding. Extremities: No cyanosis or edema. Lymphatics: see Neck Exam Skin: No concerning lesions. Musculoskeletal: symmetric strength and muscle tone throughout. Neurologic: Cranial nerves II through XII are grossly intact. No obvious focalities. Speech is fluent. Coordination is intact. Psychiatric: Judgment and insight are intact. Affect is appropriate. Left breast: No palpable mass, nipple discharge, or bleeding.  Right breast: Bruising noted in the upper outer quadrant.  Palpable lump  in this area difficult to separate from bruising. No  nipple discharge, or bleeding.  ECOG =  0  0 - Asymptomatic (Fully active, able to carry on all predisease activities without restriction)  1 - Symptomatic but completely ambulatory (Restricted in physically strenuous activity but ambulatory and able to carry out work of a light or sedentary nature. For example, light housework, office work)  2 - Symptomatic, <50% in bed during the day (Ambulatory and capable of all self care but unable to carry out any work activities. Up and about more than 50% of waking hours)  3 - Symptomatic, >50% in bed, but not bedbound (Capable of only limited self-care, confined to bed or chair 50% or more of waking hours)  4 - Bedbound (Completely disabled. Cannot carry on any self-care. Totally confined to bed or chair)  5 - Death   Eustace Pen MM, Creech RH, Tormey DC, et al. [phone]). "Toxicity and response criteria of the [facility]". [place] Oncol. 5 (6): 649-55  LABORATORY DATA:  Lab Results  Component Value Date   WBC 4.8 11/14/2018   HGB 12.9 11/14/2018   HCT 40.0 11/14/2018   MCV 89.7 11/14/2018   PLT 213 11/14/2018   NEUTROABS 2,755 11/14/2018   Lab Results  Component Value Date   NA 140 11/14/2018   K 4.1 11/14/2018   CL 105 11/14/2018   CO2 27 11/14/2018   GLUCOSE 97 11/14/2018   CREATININE 0.84 11/14/2018   CALCIUM 9.4 11/14/2018      RADIOGRAPHY: US BREAST LTD UNI RIGHT INC AXILLA  Result Date: 11/11/2019 CLINICAL DATA:  Patient recalled from screening for right breast asymmetry. Additionally, patient had prior biopsy within the upper-outer right breast which demonstrated PASH. EXAM: DIGITAL DIAGNOSTIC RIGHT MAMMOGRAM WITH CAD AND TOMO ULTRASOUND RIGHT BREAST COMPARISON:  Previous exam(s). ACR Breast Density Category c: The breast tissue is heterogeneously dense, which may obscure small masses. FINDINGS: Questioned asymmetry within the superior right breast on the MLO view predominately effaced with additional imaging suggestive of dense fibroglandular  tissue. Additionally, a lobular mass is demonstrated adjacent to the ribbon shaped marking clip within the upper-outer right breast at the site of prior ultrasound-guided core needle biopsy. Mammographic images were processed with CAD. Targeted ultrasound is performed, showing a 1.1 x 0.8 x 1.4 cm irregular hypoechoic mass right breast 10 o'clock position 6 cm from nipple. There is an adjacent hyperechoic region most compatible with biopsy marking clip. Additionally, there is an adjacent complicated cyst measuring 6 mm. IMPRESSION: 1. Questioned asymmetry right breast resolved with additional imaging compatible with dense fibroglandular tissue. 2. Slight interval increase in size of irregular hypoechoic right breast mass 10 o'clock position, previously biopsied demonstrating PASH. RECOMMENDATION: Ultrasound-guided core needle biopsy of the 1.4 cm irregular hypoechoic right breast mass 10 o'clock position given interval enlargement compared to prior exams. I have discussed the findings and recommendations with the patient. If applicable, a reminder letter will be sent to the patient regarding the next appointment. BI-RADS CATEGORY  4: Suspicious. Electronically Signed   By: Lovey Newcomer M.D.   On: 11/11/2019 12:54   MM DIAG BREAST TOMO UNI RIGHT  Result Date: 11/11/2019 CLINICAL DATA:  Patient recalled from screening for right breast asymmetry. Additionally, patient had prior biopsy within the upper-outer right breast which demonstrated PASH. EXAM: DIGITAL DIAGNOSTIC RIGHT MAMMOGRAM WITH CAD AND TOMO ULTRASOUND RIGHT BREAST COMPARISON:  Previous exam(s). ACR Breast Density Category c: The breast tissue is heterogeneously dense, which may  obscure small masses. FINDINGS: Questioned asymmetry within the superior right breast on the MLO view predominately effaced with additional imaging suggestive of dense fibroglandular tissue. Additionally, a lobular mass is demonstrated adjacent to the ribbon shaped marking clip  within the upper-outer right breast at the site of prior ultrasound-guided core needle biopsy. Mammographic images were processed with CAD. Targeted ultrasound is performed, showing a 1.1 x 0.8 x 1.4 cm irregular hypoechoic mass right breast 10 o'clock position 6 cm from nipple. There is an adjacent hyperechoic region most compatible with biopsy marking clip. Additionally, there is an adjacent complicated cyst measuring 6 mm. IMPRESSION: 1. Questioned asymmetry right breast resolved with additional imaging compatible with dense fibroglandular tissue. 2. Slight interval increase in size of irregular hypoechoic right breast mass 10 o'clock position, previously biopsied demonstrating PASH. RECOMMENDATION: Ultrasound-guided core needle biopsy of the 1.4 cm irregular hypoechoic right breast mass 10 o'clock position given interval enlargement compared to prior exams. I have discussed the findings and recommendations with the patient. If applicable, a reminder letter will be sent to the patient regarding the next appointment. BI-RADS CATEGORY  4: Suspicious. Electronically Signed   By: Lovey Newcomer M.D.   On: 11/11/2019 12:54   MM 3D SCREEN BREAST BILATERAL  Result Date: 10/29/2019 CLINICAL DATA:  Screening. EXAM: DIGITAL SCREENING BILATERAL MAMMOGRAM WITH TOMO AND CAD COMPARISON:  Previous exams. ACR Breast Density Category c: The breast tissue is heterogeneously dense, which may obscure small masses. FINDINGS: In the right breast, a possible asymmetry warrants further evaluation. In the left breast, no findings suspicious for malignancy. Images were processed with CAD. IMPRESSION: Further evaluation is suggested for possible asymmetry in the right breast. RECOMMENDATION: Diagnostic mammogram and possibly ultrasound of the right breast. (Code:[id number]) The patient will be contacted regarding the findings, and additional imaging will be scheduled. BI-RADS CATEGORY  0: Incomplete. Need additional imaging evaluation  and/or prior mammograms for comparison. Electronically Signed   By: Everlean Alstrom M.D.   On: 10/29/2019 10:13   MM CLIP PLACEMENT RIGHT  Result Date: 11/20/2019 CLINICAL DATA:  Status post ultrasound-guided core needle biopsy a 1.4 cm enlarging area of previously biopsied PASH in the 10 o'clock position of the right breast. EXAM: DIAGNOSTIC RIGHT MAMMOGRAM POST ULTRASOUND BIOPSY COMPARISON:  Previous exam(s). FINDINGS: Mammographic images were obtained following ultrasound guided biopsy of the 1.4 cm area enlarging previously biopsied PASH. The biopsy marking clip is in expected position at the site of biopsy. IMPRESSION: Appropriate positioning of the coil shaped biopsy marking clip at the site of biopsy in the 10 o'clock position of the right breast, adjacent to the previously placed ribbon shaped clip. Final Assessment: Post Procedure Mammograms for Marker Placement Electronically Signed   By: Claudie Revering M.D.   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 This was found to be concordant by Dr. Claudie Revering. Pathology results were discussed with the patient by telephone. The patient reported doing well after the biopsy with tenderness at the site. Post biopsy instructions and care were reviewed and questions were answered. The patient was encouraged to call [facility] for any additional concerns. My direct phone number was provided. Surgical consultation has been arranged with Dr. Rolm Bookbinder at [facility] on November 26, 2019. Pathology results reported by Terie Purser, RN on 11/21/2019. Electronically Signed   By: Claudie Revering M.D.   On: 11/21/2019 12:21   Result Date: 11/21/2019 CLINICAL DATA:  Increased size a mass in the 10 o'clock  position of the right breast previously biopsied demonstrating PASH. EXAM: ULTRASOUND GUIDED RIGHT BREAST CORE NEEDLE BIOPSY COMPARISON:  Previous exam(s). PROCEDURE: I met with the patient and we discussed the procedure of ultrasound-guided biopsy, including benefits and alternatives. We discussed the high likelihood of a successful procedure. We discussed the risks of the procedure, including infection, bleeding, tissue injury, clip migration, and inadequate sampling. Informed written consent was given. The usual time-out protocol was performed immediately prior to the procedure. Lesion quadrant: Upper outer quadrant Using sterile technique and 1% Lidocaine as local anesthetic, under direct ultrasound visualization, a 12 gauge spring-loaded device was used to perform biopsy of recently demonstrated 1.4 cm irregular hypoechoic area in the 10 position of the right breast, 6 cm from the nipple, using a caudal approach. At the conclusion of the procedure a coil shaped tissue marker clip was deployed into the biopsy cavity. Follow up 2 view mammogram was performed and dictated separately. IMPRESSION: Ultrasound guided biopsy of the recently demonstrated 1.0 cm irregular hypoechoic in the 10 position of right breast. No apparent complications. Electronically Signed: By: Claudie Revering M.D. On: 11/20/2019 08:50      IMPRESSION: Clinical Stage I (T1c, Nx) Right Breast UOQ, Invasive Ductal Carcinoma with DCIS, ER/PR/Her2 pending, Grade 1  Prognostic indicators are pending at this time.  She would appear to be an excellent candidate for breast conserving surgery with radiation therapy as a component of this treatment.  She would also be a candidate for mastectomy but not really recommended in this situation.  The patient has had several biopsies of both breasts and she is concerned about ongoing biopsies if she does breast conserving therapy.  We also discussed mastectomy with immediate reconstruction and discussed  potential referral to plastic surgery.  Today we discussed the general course of adjuvant radiation therapy including anticipated side effects and potential long-term toxicities.  She appears interested in this treatment approach  PLAN: She is scheduled to see Dr. Jana Hakim in consultation on 11/26/2019.  She will also meet with Dr. Donne Hazel later this week for surgical oncology consultation.   Total time spent in this encounter was 45 minutes which included reviewing the patient's most recent screening mammogram, diagnostic mammogram, right breast ultrasound, biopsy/pathology, physical examination, and documentation.   ------------------------------------------------  Blair Promise, PhD, MD  This document serves as a record of services personally performed by Gery Pray, MD. It was created on his behalf by Clerance Lav, a trained medical scribe. The creation of this record is based on the scribe's personal observations and the provider's statements to them. This document has been checked and approved by the attending provider.

## 2019-11-24 ENCOUNTER — Encounter: Payer: Self-pay | Admitting: Radiation Oncology

## 2019-11-24 ENCOUNTER — Other Ambulatory Visit: Payer: Self-pay | Admitting: General Surgery

## 2019-11-24 ENCOUNTER — Other Ambulatory Visit: Payer: Self-pay

## 2019-11-24 ENCOUNTER — Ambulatory Visit
Admission: RE | Admit: 2019-11-24 | Discharge: 2019-11-24 | Disposition: A | Discharge status: Home / Self Care | Payer: 59 | Source: Ambulatory Visit | Attending: Radiation Oncology | Admitting: Radiation Oncology

## 2019-11-24 ENCOUNTER — Encounter: Payer: Self-pay | Admitting: Licensed Clinical Social Worker

## 2019-11-24 ENCOUNTER — Other Ambulatory Visit (HOSPITAL_COMMUNITY): Payer: Self-pay | Admitting: General Surgery

## 2019-11-24 DIAGNOSIS — N6489 Other specified disorders of breast: Secondary | ICD-10-CM | POA: Diagnosis not present

## 2019-11-24 DIAGNOSIS — Z170 Estrogen receptor positive status [ER+]: Secondary | ICD-10-CM

## 2019-11-24 DIAGNOSIS — M129 Arthropathy, unspecified: Secondary | ICD-10-CM | POA: Diagnosis not present

## 2019-11-24 DIAGNOSIS — C50911 Malignant neoplasm of unspecified site of right female breast: Secondary | ICD-10-CM

## 2019-11-24 DIAGNOSIS — C50411 Malignant neoplasm of upper-outer quadrant of right female breast: Secondary | ICD-10-CM | POA: Insufficient documentation

## 2019-11-24 DIAGNOSIS — Z803 Family history of malignant neoplasm of breast: Secondary | ICD-10-CM | POA: Diagnosis not present

## 2019-11-24 DIAGNOSIS — C50011 Malignant neoplasm of nipple and areola, right female breast: Secondary | ICD-10-CM

## 2019-11-24 HISTORY — DX: Estrogen receptor positive status (ER+): Z17.0

## 2019-11-24 HISTORY — DX: Malignant neoplasm of upper-outer quadrant of right female breast: C50.411

## 2019-11-24 NOTE — Progress Notes (Signed)
Patient here for a consult with Dr. Sondra Come.  Location of Breast Cancer: right breast cancer  Biopsy on 11/20/2019 showed grade 1 invasive ductal carcinoma with ductal carcinoma in situ. Prognostic indicators are pending.  9/28/21unilateral mammogram and right breast ultrasound.  Past/Anticipated interventions by surgeon, if any: sees Dr. Donne Hazel on 10/13 and Dr. Jana Hakim.  Past/Anticipated interventions by medical oncology, if any: Chemotherapy: unknown  Lymphedema issues, if any: n/a  Pain issues, if any:  n/a  SAFETY ISSUES:  Prior radiation?  no  Pacemaker/ICD? no  Possible current pregnancy? No, having periods  Is the patient on methotrexate? No  Current Complaints / other details: Concerned about dx.    BP 131/80 (BP Location: Right Arm, Patient Position: Sitting, Cuff Size: Normal)   Pulse 95   Temp 98.6 F (37 C) (Oral)   Ht 5\' 8"  (1.727 m)   Wt 137 lb 3.2 oz (62.2 kg)   BMI 20.86 kg/m   Wt Readings from Last 3 Encounters:  11/24/19 137 lb 3.2 oz (62.2 kg)  11/13/17 135 lb (61.2 kg)  07/03/17 134 lb 12.8 oz (61.1 kg)     De Burrs, RN 11/24/2019,7:26 AM

## 2019-11-24 NOTE — Progress Notes (Signed)
[place] Psychosocial Distress Screening Clinical Social Work  Clinical Social Work was referred by distress screening protocol.  The patient scored a 8 on the Psychosocial Distress Thermometer which indicates severe distress. Per note from RN, patient declined social work contact. CSW may be reconsulted as needed in the future.  [facility] DISTRESS SCREENING 11/24/2019  Screening Type Initial Screening  Distress experienced in past week (1-10) 8  Other declined social work referral       Clinical Social Worker follow up needed: No. Pt declined    Latonya Knight E, LCSW

## 2019-11-26 ENCOUNTER — Encounter: Payer: Self-pay | Admitting: *Deleted

## 2019-11-26 ENCOUNTER — Other Ambulatory Visit: Payer: Self-pay

## 2019-11-26 ENCOUNTER — Inpatient Hospital Stay: Payer: 59

## 2019-11-26 ENCOUNTER — Inpatient Hospital Stay (HOSPITAL_BASED_OUTPATIENT_CLINIC_OR_DEPARTMENT_OTHER): Payer: 59 | Admitting: Genetic Counselor

## 2019-11-26 ENCOUNTER — Other Ambulatory Visit: Payer: Self-pay | Admitting: *Deleted

## 2019-11-26 ENCOUNTER — Inpatient Hospital Stay: Payer: 59 | Attending: Oncology | Admitting: Oncology

## 2019-11-26 ENCOUNTER — Encounter: Payer: Self-pay | Admitting: Genetic Counselor

## 2019-11-26 ENCOUNTER — Encounter: Payer: Self-pay | Admitting: Adult Health

## 2019-11-26 VITALS — BP 105/69 | HR 74 | Temp 98.8°F | Resp 18 | Ht 68.0 in | Wt 139.4 lb

## 2019-11-26 DIAGNOSIS — Z170 Estrogen receptor positive status [ER+]: Secondary | ICD-10-CM

## 2019-11-26 DIAGNOSIS — Z801 Family history of malignant neoplasm of trachea, bronchus and lung: Secondary | ICD-10-CM | POA: Insufficient documentation

## 2019-11-26 DIAGNOSIS — C50411 Malignant neoplasm of upper-outer quadrant of right female breast: Secondary | ICD-10-CM

## 2019-11-26 DIAGNOSIS — Z803 Family history of malignant neoplasm of breast: Secondary | ICD-10-CM | POA: Diagnosis not present

## 2019-11-26 DIAGNOSIS — C50911 Malignant neoplasm of unspecified site of right female breast: Secondary | ICD-10-CM | POA: Diagnosis not present

## 2019-11-26 LAB — CMP (CANCER CENTER ONLY)
ALT: 11 U/L (ref 0–44)
AST: 17 U/L (ref 15–41)
Albumin: 3.8 g/dL (ref 3.5–5.0)
Alkaline Phosphatase: 41 U/L (ref 38–126)
Anion gap: 5 (ref 5–15)
BUN: 11 mg/dL (ref 6–20)
CO2: 29 mmol/L (ref 22–32)
Calcium: 9.3 mg/dL (ref 8.9–10.3)
Chloride: 106 mmol/L (ref 98–111)
Creatinine: 0.79 mg/dL (ref 0.44–1.00)
GFR, Estimated: >60 mL/min (ref >60)
Glucose, Bld: 91 mg/dL (ref 70–99)
Potassium: 3.8 mmol/L (ref 3.5–5.1)
Sodium: 140 mmol/L (ref 135–145)
Total Bilirubin: 0.4 mg/dL (ref 0.3–1.2)
Total Protein: 6.6 g/dL (ref 6.5–8.1)

## 2019-11-26 LAB — CBC WITH DIFFERENTIAL (CANCER CENTER ONLY)
Abs Immature Granulocytes: 0.03 10*3/uL (ref 0.00–0.07)
Basophils Absolute: 0.1 10*3/uL (ref 0.0–0.1)
Basophils Relative: 1 %
Eosinophils Absolute: 0.1 10*3/uL (ref 0.0–0.5)
Eosinophils Relative: 1 %
HCT: 35.8 % — ABNORMAL LOW (ref 36.0–46.0)
Hemoglobin: 12.1 g/dL (ref 12.0–15.0)
Immature Granulocytes: 0 %
Lymphocytes Relative: 15 %
Lymphs Abs: 1.2 10*3/uL (ref 0.7–4.0)
MCH: 29.7 pg (ref 26.0–34.0)
MCHC: 33.8 g/dL (ref 30.0–36.0)
MCV: 87.7 fL (ref 80.0–100.0)
Monocytes Absolute: 0.6 10*3/uL (ref 0.1–1.0)
Monocytes Relative: 8 %
Neutro Abs: 5.8 10*3/uL (ref 1.7–7.7)
Neutrophils Relative %: 75 %
Platelet Count: 206 10*3/uL (ref 150–400)
RBC: 4.08 MIL/uL (ref 3.87–5.11)
RDW: 12.5 % (ref 11.5–15.5)
WBC Count: 7.8 10*3/uL (ref 4.0–10.5)
nRBC: 0 % (ref 0.0–0.2)

## 2019-11-26 LAB — GENETIC SCREENING ORDER

## 2019-11-26 NOTE — Progress Notes (Signed)
REFERRING PROVIDER: Chauncey Cruel, MD [address] [place],  [place] [address]  PRIMARY PROVIDER:  Paula Compton, MD   PRIMARY REASON FOR VISIT:  1. Malignant neoplasm of upper-outer quadrant of right breast in female, estrogen receptor positive ([place])   2. Family history of malignant neoplasm of breast    HISTORY OF PRESENT ILLNESS:   Amber [name], a 49 y.o. female, was seen for a [facility] cancer genetics consultation at the request of Dr. Jana Hakim due to a personal and family history of breast cancer.  Amber [name] presents to clinic today to discuss the possibility of a hereditary predisposition to cancer, to discuss genetic testing, and to further clarify her future cancer risks, as well as potential cancer risks for family members.   In October 2021, at the age of 56, Amber [name] was diagnosed with invasive ductal carcinoma of the right breast. The preliminary treatment plan includes surgery and Oncotype.   CANCER HISTORY:  Oncology History  Malignant neoplasm of upper-outer quadrant of right breast in female, estrogen receptor positive ([place])  11/20/2019 Cancer Staging   Staging form: Breast, AJCC 8th Edition - Clinical stage from 11/20/2019: Stage IA (cT1c, cN0, cM0, G1, ER+, PR+, HER2-) - Signed by Gardenia Phlegm, NP on 11/26/2019   11/24/2019 Initial Diagnosis   Malignant neoplasm of upper-outer quadrant of right breast in female, estrogen receptor positive ([place])    RISK FACTORS:  Menarche was at age 64.  First live birth at age 3.  OCP use for approximately 8 years.  Ovaries intact: yes.  Hysterectomy: no.  Menopausal status: premenopausal.  HRT use: 0 years. Colonoscopy: no; not examined. Mammogram within the last year: yes. Number of breast biopsies: 3. Up to date with pelvic exams: yes. Any excessive radiation exposure in the past: no  Past Surgical History:  Procedure Laterality Date  . BREAST BIOPSY Right 2019  . none      Social  History   Socioeconomic History  . Marital status: Married    Spouse name: Not on file  . Number of children: 3  . Years of education: Not on file  . Highest education level: Not on file  Occupational History  . Occupation: Nurse P.  Tobacco Use  . Smoking status: Never Smoker  . Smokeless tobacco: Never Used  Vaping Use  . Vaping Use: Never used  Substance and Sexual Activity  . Alcohol use: Not Currently  . Drug use: Never  . Sexual activity: Yes    Partners: Male  Other Topics Concern  . Not on file  Social History Narrative  . Not on file   Social Determinants of Health   Financial Resource Strain:   . Difficulty of Paying Living Expenses: Not on file  Food Insecurity:   . Worried About Charity fundraiser in the Last Year: Not on file  . Ran Out of Food in the Last Year: Not on file  Transportation Needs:   . Lack of Transportation (Medical): Not on file  . Lack of Transportation (Non-Medical): Not on file  Physical Activity:   . Days of Exercise per Week: Not on file  . Minutes of Exercise per Session: Not on file  Stress:   . Feeling of Stress : Not on file  Social Connections:   . Frequency of Communication with Friends and Family: Not on file  . Frequency of Social Gatherings with Friends and Family: Not on file  . Attends Religious Services: Not on file  .  Active Member of Clubs or Organizations: Not on file  . Attends Archivist Meetings: Not on file  . Marital Status: Not on file    FAMILY HISTORY:  We obtained a detailed, 4-generation family history.  Significant diagnoses are listed below: Family History  Problem Relation Age of Onset  . Breast cancer Paternal Aunt        dx after 62  . Lung cancer Maternal Grandfather 3    Amber [name] has two daughter and one son.  Amber [name] has one sister, age 6, who does not have cancer.  She also has several paternal half siblings and has limited information about their health.  Amber [name]'s  mother is 31 and does not have a cancer history.  Amber [name]'s maternal grandfather was diagnosed with lung cancer at age 73. No other maternal family history of cancer was reported.  Amber [name]'s father passed away at age 45 and did not have cancer.  She has a paternal aunt diagnosed with breast cancer after the age of 35.  She has limited information about her paternal family members but did not report other cancer.   Amber [name] is unaware of previous family history of genetic testing for hereditary cancer risks. Patient's maternal ancestors are of White/Caucasian descent, and paternal ancestors are of White/Caucasian descent. There is no reported Ashkenazi Jewish ancestry. There is no known consanguinity.  GENETIC COUNSELING ASSESSMENT: Amber [name] is a 49 y.o. female with a personal and family history of breast cancer which is somewhat suggestive of a hereditary cancer syndrome, such as Hereditary Breast and Ovarian Cancer Syndrome, and predisposition to cancer given her age of diagnosis. We, therefore, discussed and recommended the following at today's visit.   DISCUSSION: We discussed that 5 - 10% of breast cancer is hereditary, with most cases of hereditary breast cancer associated with mutations in BRCA1 and BRCA2.  There are other genes that can be associated with hereditary breast cancer syndromes.  Type of cancer and level of cancer risk are gene-specific.  We discussed that testing is beneficial for several reasons including knowing how to follow individuals after completing their treatment, identifying whether potential treatment options would be beneficial, and understanding if other family members could be at risk for cancer and allowing them to undergo genetic testing.   We reviewed the characteristics, features and inheritance patterns of hereditary cancer syndromes. We also discussed genetic testing, including the appropriate family members to test, the process of testing, insurance  coverage and turn-around-time for results. We discussed the implications of a negative, positive and/or variant of uncertain significant result. In order to get genetic test results in a timely manner so that Ms. Daily can use these genetic test results for surgical decisions, we recommended Ms. Richardson pursue genetic testing for the STAT Breast Cancer Panel.  The STAT Breast cancer panel offered by [facility] includes sequencing and rearrangement analysis for the following 9 genes:  ATM, BRCA1, BRCA2, CDH1, CHEK2, PALB2, PTEN, STK11 and TP53.  Once complete, we recommend Ms. Saintvil pursue reflex genetic testing to a more comphrehensive gene panel.   Ms. Steinhaus  was offered a common hereditary cancer panel (48 genes) and an expanded pan-cancer panel (85 genes). Ms. Raisch was informed of the benefits and limitations of each panel, including that expanded pan-cancer panels contain several preliminary evidence genes that do not have clear management guidelines at this point in time.  We also discussed that as the number of genes included on a panel increases,  the chances of variants of uncertain significance increases.  After considering the benefits and limitations of each gene panel, Ms. Gravelle  elected to have a common hereditary cancers panel through [facility].  The Common Hereditary Cancers Panel offered by [facility] includes sequencing and/or deletion duplication testing of the following 48 genes: APC, ATM, AXIN2, BARD1, BMPR1A, BRCA1, BRCA2, BRIP1, CDH1, CDK4, CDKN2A (p14ARF), CDKN2A (p16INK4a), CHEK2, CTNNA1, DICER1, EPCAM (Deletion/duplication testing only), GREM1 (promoter region deletion/duplication testing only), KIT, MEN1, MLH1, MSH2, MSH3, MSH6, MUTYH, NBN, NF1, NHTL1, PALB2, PDGFRA, PMS2, POLD1, POLE, PTEN, RAD50, RAD51C, RAD51D, RNF43, SDHB, SDHC, SDHD, SMAD4, SMARCA4. STK11, TP53, TSC1, TSC2, and VHL.  The following genes were evaluated for sequence changes only: SDHA and HOXB13 c.251G>A variant  only.  Based on Ms. Nuno's personal and family history of breast cancer, she meets medical criteria for genetic testing. Despite that she meets criteria, she may still have an out of pocket cost. We discussed that if her out of pocket cost for testing is over $100, the laboratory will call and confirm whether she wants to proceed with testing.  If the out of pocket cost of testing is less than $100 she will be billed by the genetic testing laboratory.   PLAN: After considering the risks, benefits, and limitations, Ms. Selders provided informed consent to pursue genetic testing and the blood sample was sent to [facility] for analysis of the STAT Breast Cancer Panel and Common Hereditary Cancers Panel. Results should be available within approximately 1-2 weeks' time, at which point they will be disclosed by telephone to Ms. Brinlee, as will any additional recommendations warranted by these results. Ms. Delpriore will receive a summary of her genetic counseling visit and a copy of her results once available. This information will also be available in Epic.   Lastly, we encouraged Ms. Muhlenkamp to remain in contact with cancer genetics annually so that we can continuously update the family history and inform her of any changes in cancer genetics and testing that may be of benefit for this family.   Ms. Skillen's questions were answered to her satisfaction today. Our contact information was provided should additional questions or concerns arise. Thank you for the referral and allowing [place] to share in the care of your patient.   Azul Brumett M. Joette Catching, [place], [facility] Certified Film/video [email or web address] (P) [phone]  The patient was seen for a total of 20 minutes in face-to-face genetic counseling.  This patient was discussed with Drs. Magrinat, Lindi Adie and/or Burr Medico who agrees with the above.   _______________________________________________________________________ For Office Staff:    Number of people involved in session: 1 Was an Intern/ student involved with case: no

## 2019-11-26 NOTE — Progress Notes (Signed)
[facility]  [phone] [phone]     ID: Amber [name] DOB: 19-Jul-1970  MR#: [id number]  YKD#:[id number]  Patient Care Team: Paula Compton, MD as PCP - General (Obstetrics and Gynecology) Mauro Kaufmann, RN as Oncology Nurse Navigator Rockwell Germany, RN as Oncology Nurse Navigator Gery Pray, MD as Consulting Physician (Radiation Oncology) Damia Bobrowski, Virgie Dad, MD as Consulting Physician (Oncology) Rolm Bookbinder, MD as Consulting Physician (General Surgery) Chauncey Cruel, MD OTHER MD:  CHIEF COMPLAINT: Estrogen receptor positive breast cancer  CURRENT TREATMENT: Awaiting definitive surgery   HISTORY OF CURRENT ILLNESS: Amber [name] has a history of bilateral breast biopsies. 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 In 08/2017, a left breast biopsy showed only fibrocystic change.  She had routine screening mammography on 10/28/2019 showing a possible abnormality in the right breast. 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  Accordingly on 11/20/2019 she proceeded to biopsy of the right breast area in question. The pathology from this procedure ([id number]) showed: invasive ductal carcinoma, grade 1; ductal carcinoma in situ. The prognostic panel results were reported at conference this morning as showing estrogen receptor 95% positive with strong staining, progesterone receptor 50% positive with strong staining, HER-2 not amplified by immunohistochemistry, and MIB-1 of 2%  The patient's subsequent history is as detailed below.   INTERVAL HISTORY: Amber [name] was evaluated in the breast cancer clinic on  11/26/2019. Her case was also presented at the multidisciplinary breast cancer conference on the same day. At that time a preliminary plan was proposed: MRI, likely is conserving surgery (the patient is reportedly interested in mastectomies), Oncotype and genetics testing  She has already met with her surgeon and radiation oncologist and is scheduled for breast MRI tomorrow, 11/27/2019.   REVIEW OF SYSTEMS: There were no specific symptoms leading to the original mammogram, which was routinely scheduled. The patient denies unusual headaches, visual changes, nausea, vomiting, stiff neck, dizziness, or gait imbalance. There has been no cough, phlegm production, or pleurisy, no chest pain or pressure, and no change in bowel or bladder habits. The patient denies fever, rash, bleeding, unexplained fatigue or unexplained weight loss.  She has had both doses of the Pfizer vaccine as well as the booster shot.  She exercises mostly by walking.  He does go to the gym about once a week.  A detailed review of systems was otherwise entirely negative.   PAST MEDICAL HISTORY: No past medical history on file.  Sleep apnea; on CPAP  PAST SURGICAL HISTORY: Past Surgical History:  Procedure Laterality Date  . BREAST BIOPSY Right 2019  . none      FAMILY HISTORY: Family History  Problem Relation Age of Onset  . Breast cancer Paternal Aunt        dx after 37  . Arthritis Mother   . GER disease Mother   . Arthritis Sister   . Lung cancer Maternal Grandfather 34  The patient's father died in an accident in his 75s.  The patient's mother is 48 years old as of October 2021.  The patient has 1 sister, no brothers.  On the paternal side there is an aunt with possible cancer but the patient has very little data.  On  the maternal side the patient's mother's father died from lung cancer in the setting of tobacco abuse   GYNECOLOGIC HISTORY:  No LMP recorded. Menarche: 49 years old Age at first live birth: 49  years old [place] P 3 LMP status post endometrial ablation Contraceptive husband is status post vasectomy HRT no  Hysterectomy?  No BSO?  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  Her husband Amber [name] works in a I for [employer].  Their children are 17, 15 and 14.  Her 61 year old daughter is already committed to [facility] and has a Consulting civil engineer.  The patient attends [employer] in [place]: In the absence of any documentation to the contrary, the patient's spouse is their HCPOA.    HEALTH MAINTENANCE: Social History   Tobacco Use  . Smoking status: Never Smoker  . Smokeless tobacco: Never Used  Vaping Use  . Vaping Use: Never used  Substance Use Topics  . Alcohol use: Not Currently  . Drug use: Never     Colonoscopy: n/a (age)  PAP: Up-to-date  Bone density: Never   No Known Allergies  No current outpatient medications on file.   No current facility-administered medications for this visit.    OBJECTIVE: White woman who appears stated age  49:   11/26/19 1615  BP: 105/69  Pulse: 74  Resp: 18  Temp: 98.8 F (37.1 C)  SpO2: 99%     Body mass index is 21.2 kg/m.   Wt Readings from Last 3 Encounters:  11/26/19 139 lb 6.4 oz (63.2 kg)  11/24/19 137 lb 3.2 oz (62.2 kg)  11/13/17 135 lb (61.2 kg)      ECOG FS:1 - Symptomatic but completely ambulatory  Ocular: Sclerae unicteric, pupils round and equal Ear-nose-throat: Wearing a mask Lymphatic: No cervical or supraclavicular adenopathy Lungs no rales or rhonchi Heart regular rate and rhythm Abd soft, nontender, positive bowel sounds MSK no focal spinal tenderness, no joint edema Neuro: non-focal, well-oriented, appropriate affect Breasts: The right breast is status post recent biopsy.  There is a small ecchymosis.  There is no palpable mass.  The left breast is benign.  Both axillae are benign.   LAB RESULTS:  CMP      Component Value Date/Time   NA 140 11/26/2019 1547   K 3.8 11/26/2019 1547   CL 106 11/26/2019 1547   CO2 29 11/26/2019 1547   GLUCOSE 91 11/26/2019 1547   BUN 11 11/26/2019 1547   CREATININE 0.79 11/26/2019 1547   CREATININE 0.84 11/14/2018 0944   CALCIUM 9.3 11/26/2019 1547   PROT 6.6 11/26/2019 1547   ALBUMIN 3.8 11/26/2019 1547   AST 17 11/26/2019 1547   ALT 11 11/26/2019 1547   ALKPHOS 41 11/26/2019 1547   BILITOT 0.4 11/26/2019 1547   GFRNONAA >60 11/26/2019 1547   GFRNONAA 83 11/14/2018 0944   GFRAA 96 11/14/2018 0944    No results found for: TOTALPROTELP, ALBUMINELP, A1GS, A2GS, BETS, BETA2SER, GAMS, MSPIKE, SPEI  Lab Results  Component Value Date   WBC 7.8 11/26/2019   NEUTROABS 5.8 11/26/2019   HGB 12.1 11/26/2019   HCT 35.8 (L) 11/26/2019   MCV 87.7 11/26/2019   PLT 206 11/26/2019    No results found for: LABCA2  No components found for: QMVHQI696  No results for input(s): INR in the last 168 hours.  No results found for: LABCA2  No results found for: EXB284  No results found  for: KDT267  No results found for: TIW580  No results found for: CA2729  No components found for: HGQUANT  No results found for: CEA1 / No results found for: CEA1   No results found for: AFPTUMOR  No results found for: CHROMOGRNA  No results found for: KPAFRELGTCHN, LAMBDASER, KAPLAMBRATIO (kappa/lambda light chains)  No results found for: HGBA, HGBA2QUANT, HGBFQUANT, HGBSQUAN (Hemoglobinopathy evaluation)   No results found for: LDH  No results found for: IRON, TIBC, IRONPCTSAT (Iron and TIBC)  No results found for: FERRITIN  Urinalysis    Component Value Date/Time   COLORURINE YELLOW 06/17/2018 1540   APPEARANCEUR CLEAR 06/17/2018 1540   LABSPEC <=1.005 (A) 06/17/2018 1540   PHURINE 6.5 06/17/2018 1540   GLUCOSEU NEGATIVE 06/17/2018 1540   HGBUR NEGATIVE 06/17/2018 1540   BILIRUBINUR NEGATIVE 06/17/2018 1540   KETONESUR NEGATIVE 06/17/2018 1540    UROBILINOGEN 0.2 06/17/2018 1540   NITRITE NEGATIVE 06/17/2018 1540   LEUKOCYTESUR NEGATIVE 06/17/2018 1540     STUDIES: US BREAST LTD UNI RIGHT INC AXILLA  Addendum Date: 11/25/2019   ADDENDUM REPORT: 11/25/2019 08:40 ADDENDUM: Addendum for addition under FINDINGS: Ultrasound of the right axilla was performed at the time of the original examination. No right axillary adenopathy was identified. Electronically Signed   By: Lovey Newcomer M.D.   On: 11/25/2019 08:40   Result Date: 11/25/2019 CLINICAL DATA:  Patient recalled from screening for right breast asymmetry. Additionally, patient had prior biopsy within the upper-outer right breast which demonstrated PASH. EXAM: DIGITAL DIAGNOSTIC RIGHT MAMMOGRAM WITH CAD AND TOMO ULTRASOUND RIGHT BREAST COMPARISON:  Previous exam(s). ACR Breast Density Category c: The breast tissue is heterogeneously dense, which may obscure small masses. FINDINGS: Questioned asymmetry within the superior right breast on the MLO view predominately effaced with additional imaging suggestive of dense fibroglandular tissue. Additionally, a lobular mass is demonstrated adjacent to the ribbon shaped marking clip within the upper-outer right breast at the site of prior ultrasound-guided core needle biopsy. Mammographic images were processed with CAD. Targeted ultrasound is performed, showing a 1.1 x 0.8 x 1.4 cm irregular hypoechoic mass right breast 10 o'clock position 6 cm from nipple. There is an adjacent hyperechoic region most compatible with biopsy marking clip. Additionally, there is an adjacent complicated cyst measuring 6 mm. IMPRESSION: 1. Questioned asymmetry right breast resolved with additional imaging compatible with dense fibroglandular tissue. 2. Slight interval increase in size of irregular hypoechoic right breast mass 10 o'clock position, previously biopsied demonstrating PASH. RECOMMENDATION: Ultrasound-guided core needle biopsy of the 1.4 cm irregular hypoechoic right  breast mass 10 o'clock position given interval enlargement compared to prior exams. I have discussed the findings and recommendations with the patient. If applicable, a reminder letter will be sent to the patient regarding the next appointment. BI-RADS CATEGORY  4: Suspicious. Electronically Signed: By: Lovey Newcomer M.D. On: 11/11/2019 12:54   MM DIAG BREAST TOMO UNI RIGHT  Addendum Date: 11/25/2019   ADDENDUM REPORT: 11/25/2019 08:40 ADDENDUM: Addendum for addition under FINDINGS: Ultrasound of the right axilla was performed at the time of the original examination. No right axillary adenopathy was identified. Electronically Signed   By: Lovey Newcomer M.D.   On: 11/25/2019 08:40   Result Date: 11/25/2019 CLINICAL DATA:  Patient recalled from screening for right breast asymmetry. Additionally, patient had prior biopsy within the upper-outer right breast which demonstrated PASH. EXAM: DIGITAL DIAGNOSTIC RIGHT MAMMOGRAM WITH CAD AND TOMO ULTRASOUND RIGHT BREAST COMPARISON:  Previous exam(s). ACR Breast Density Category c: The breast  tissue is heterogeneously dense, which may obscure small masses. FINDINGS: Questioned asymmetry within the superior right breast on the MLO view predominately effaced with additional imaging suggestive of dense fibroglandular tissue. Additionally, a lobular mass is demonstrated adjacent to the ribbon shaped marking clip within the upper-outer right breast at the site of prior ultrasound-guided core needle biopsy. Mammographic images were processed with CAD. Targeted ultrasound is performed, showing a 1.1 x 0.8 x 1.4 cm irregular hypoechoic mass right breast 10 o'clock position 6 cm from nipple. There is an adjacent hyperechoic region most compatible with biopsy marking clip. Additionally, there is an adjacent complicated cyst measuring 6 mm. IMPRESSION: 1. Questioned asymmetry right breast resolved with additional imaging compatible with dense fibroglandular tissue. 2. Slight interval  increase in size of irregular hypoechoic right breast mass 10 o'clock position, previously biopsied demonstrating PASH. RECOMMENDATION: Ultrasound-guided core needle biopsy of the 1.4 cm irregular hypoechoic right breast mass 10 o'clock position given interval enlargement compared to prior exams. I have discussed the findings and recommendations with the patient. If applicable, a reminder letter will be sent to the patient regarding the next appointment. BI-RADS CATEGORY  4: Suspicious. Electronically Signed: By: Lovey Newcomer M.D. On: 11/11/2019 12:54   MM 3D SCREEN BREAST BILATERAL  Result Date: 10/29/2019 CLINICAL DATA:  Screening. EXAM: DIGITAL SCREENING BILATERAL MAMMOGRAM WITH TOMO AND CAD COMPARISON:  Previous exams. ACR Breast Density Category c: The breast tissue is heterogeneously dense, which may obscure small masses. FINDINGS: In the right breast, a possible asymmetry warrants further evaluation. In the left breast, no findings suspicious for malignancy. Images were processed with CAD. IMPRESSION: Further evaluation is suggested for possible asymmetry in the right breast. RECOMMENDATION: Diagnostic mammogram and possibly ultrasound of the right breast. (Code:[id number]) The patient will be contacted regarding the findings, and additional imaging will be scheduled. BI-RADS CATEGORY  0: Incomplete. Need additional imaging evaluation and/or prior mammograms for comparison. Electronically Signed   By: Everlean Alstrom M.D.   On: 10/29/2019 10:13   MM CLIP PLACEMENT RIGHT  Result Date: 11/20/2019 CLINICAL DATA:  Status post ultrasound-guided core needle biopsy a 1.4 cm enlarging area of previously biopsied PASH in the 10 o'clock position of the right breast. EXAM: DIAGNOSTIC RIGHT MAMMOGRAM POST ULTRASOUND BIOPSY COMPARISON:  Previous exam(s). FINDINGS: Mammographic images were obtained following ultrasound guided biopsy of the 1.4 cm area enlarging previously biopsied PASH. The biopsy marking clip is in  expected position at the site of biopsy. IMPRESSION: Appropriate positioning of the coil shaped biopsy marking clip at the site of biopsy in the 10 o'clock position of the right breast, adjacent to the previously placed ribbon shaped clip. Final Assessment: Post Procedure Mammograms for Marker Placement Electronically Signed   By: Claudie Revering M.D.   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 This was found to be concordant by Dr. Claudie Revering. Pathology results were discussed with the patient by telephone. The patient reported doing well after the biopsy with tenderness at the site. Post biopsy instructions and care were reviewed and questions were answered. The patient was encouraged to call [facility] for any additional concerns. My direct phone number was provided. Surgical consultation has been arranged with Dr. Rolm Bookbinder at [facility] on  November 26, 2019. Pathology results reported by Terie Purser, RN on 11/21/2019. Electronically Signed   By: Claudie Revering M.D.   On: 11/21/2019 12:21   Result Date: 11/21/2019 CLINICAL DATA:  Increased size a mass in the 10 o'clock position of the right breast previously biopsied demonstrating PASH. EXAM: ULTRASOUND GUIDED RIGHT BREAST CORE NEEDLE BIOPSY COMPARISON:  Previous exam(s). PROCEDURE: I met with the patient and we discussed the procedure of ultrasound-guided biopsy, including benefits and alternatives. We discussed the high likelihood of a successful procedure. We discussed the risks of the procedure, including infection, bleeding, tissue injury, clip migration, and inadequate sampling. Informed written consent was given. The usual time-out protocol was performed immediately prior to the  procedure. Lesion quadrant: Upper outer quadrant Using sterile technique and 1% Lidocaine as local anesthetic, under direct ultrasound visualization, a 12 gauge spring-loaded device was used to perform biopsy of recently demonstrated 1.4 cm irregular hypoechoic area in the 10 position of the right breast, 6 cm from the nipple, using a caudal approach. At the conclusion of the procedure a coil shaped tissue marker clip was deployed into the biopsy cavity. Follow up 2 view mammogram was performed and dictated separately. IMPRESSION: Ultrasound guided biopsy of the recently demonstrated 1.0 cm irregular hypoechoic in the 10 position of right breast. No apparent complications. Electronically Signed: By: Claudie Revering M.D. On: 11/20/2019 08:50     ELIGIBLE FOR AVAILABLE RESEARCH PROTOCOL: AET  ASSESSMENT: 49 y.o. [facility] woman status post right breast upper outer quadrant biopsy 11/20/2019 for a clinical T1c N0, stage IA invasive ductal carcinoma, grade 1, estrogen and progesterone receptor positive, HER-2 not amplified, with an MIB-1 of 2%  (1) genetics testing 11/26/2019  (2) definitive surgery pending  (3) Oncotype to be obtained from the definitive surgical sample  (4) adjuvant radiation as appropriate  (5) antiestrogens  PLAN: I met today with Amber [name] to review her new diagnosis. Specifically we discussed the biology of her breast cancer, its diagnosis, staging, treatment  options and prognosis. We discussed the difference between local and systemic therapy. In terms of loco-regional treatment, she understands lumpectomy plus radiation is equivalent to mastectomy as far as survival is concerned. For this reason, and because the cosmetic results are generally superior, we generally recommend breast conserving surgery unless there is a concern regarding cosmesis.   We then discussed the rationale for systemic therapy. There is some risk that this cancer may have already spread to other parts of her  body. Patients frequently ask at this point about bone scans, CAT scans and PET scans to find out if they have occult breast cancer somewhere else. The problem is that in early stage disease we are much more likely to find false positives then true cancers and this would expose the patient to unnecessary procedures as well as unnecessary radiation. Scans cannot answer the question the patient really would like to know, which is whether she has microscopic disease elsewhere in her body. For those reasons we do not recommend them.  Of course we would proceed to aggressive evaluation of any symptoms that might suggest metastatic disease, but that is not the case here.  Next we went over the options for systemic therapy which are anti-estrogens, anti-HER-2 immunotherapy, and chemotherapy. Aalaiyah does not meet criteria for anti-HER-2 immunotherapy. She is a good candidate for anti-estrogens.  The question of chemotherapy is more complicated. Chemotherapy is most effective in rapidly growing, aggressive tumors. It is much less effective in low-grade, slow growing  cancers, like [place] 's. For that reason we are going to request an Oncotype from the definitive surgical sample, as suggested by NCCN guidelines.  The likelihood is that this will confirm that she will do well without chemotherapy and would not benefit from chemotherapy.  However if against expectations the Oncotype returns high risk she would need to consider chemotherapy and she would return here to discuss that.  She also qualifies for genetics testing and the test was sent today. In patients who carry a deleterious mutation [for example in a  BRCA gene], the risk of a new breast cancer developing in the future may be sufficiently great that Yuna may choose bilateral mastectomies. However if she wishes to keep her breasts in that situation it is safe to do so. That would require intensified screening, which generally means not only yearly mammography  but a yearly breast MRI as well.   Arretta has a good understanding of the overall plan. She agrees with it. She knows the goal of treatment in her case is cure. She will call with any problems that may develop before her next visit here.  Total encounter time 65 minutes.Sarajane Jews C. Edwin Baines, MD 11/26/2019 5:08 PM Medical Oncology and Hematology [facility] [place],  [address] Tel. [phone]    Fax. [phone]   This document serves as a record of services personally performed by Lurline Del, MD. It was created on his behalf by Wilburn Mylar, a trained medical scribe. The creation of this record is based on the scribe's personal observations and the provider's statements to them.   I, Lurline Del MD, have reviewed the above documentation for accuracy and completeness, and I agree with the above.    *Total Encounter Time as defined by the Centers for Medicare and Medicaid Services includes, in addition to the face-to-face time of a patient visit (documented in the note above) non-face-to-face time: obtaining and reviewing outside history, ordering and reviewing medications, tests or procedures, care coordination (communications with other health care professionals or caregivers) and documentation in the medical record.

## 2019-11-27 ENCOUNTER — Ambulatory Visit
Admission: RE | Admit: 2019-11-27 | Discharge: 2019-11-27 | Disposition: A | Discharge status: Home / Self Care | Payer: 59 | Source: Ambulatory Visit | Attending: General Surgery | Admitting: General Surgery

## 2019-11-27 ENCOUNTER — Encounter: Payer: Self-pay | Admitting: *Deleted

## 2019-11-27 ENCOUNTER — Telehealth: Payer: Self-pay | Admitting: *Deleted

## 2019-11-27 DIAGNOSIS — C50911 Malignant neoplasm of unspecified site of right female breast: Secondary | ICD-10-CM

## 2019-11-27 DIAGNOSIS — D0511 Intraductal carcinoma in situ of right breast: Secondary | ICD-10-CM | POA: Diagnosis not present

## 2019-11-27 MED ORDER — GADOBUTROL 1 MMOL/ML IV SOLN
6.0000 mL | Freq: Once | INTRAVENOUS | Status: AC | PRN
  Administered 2019-11-27: 6 mL via INTRAVENOUS

## 2019-11-27 NOTE — Telephone Encounter (Signed)
Spoke to pt regarding navigation resources and contact information. Denies questions or concerns regarding dx or treatment care plan. Encourage pt to call with needs. Received verbal understanding. 

## 2019-12-01 ENCOUNTER — Telehealth: Payer: Self-pay | Admitting: Oncology

## 2019-12-01 ENCOUNTER — Other Ambulatory Visit: Payer: Self-pay | Admitting: General Surgery

## 2019-12-01 ENCOUNTER — Encounter: Payer: Self-pay | Admitting: *Deleted

## 2019-12-01 DIAGNOSIS — R9389 Abnormal findings on diagnostic imaging of other specified body structures: Secondary | ICD-10-CM

## 2019-12-01 NOTE — Telephone Encounter (Signed)
Scheduled per 10/13 los. Called and spoke with pt, confirmed 1/18 appt

## 2019-12-02 ENCOUNTER — Ambulatory Visit: Payer: 59 | Attending: Oncology | Admitting: Physical Therapy

## 2019-12-02 ENCOUNTER — Other Ambulatory Visit: Payer: Self-pay

## 2019-12-02 ENCOUNTER — Encounter: Payer: Self-pay | Admitting: Physical Therapy

## 2019-12-02 DIAGNOSIS — Z170 Estrogen receptor positive status [ER+]: Secondary | ICD-10-CM | POA: Diagnosis not present

## 2019-12-02 DIAGNOSIS — C50911 Malignant neoplasm of unspecified site of right female breast: Secondary | ICD-10-CM | POA: Diagnosis not present

## 2019-12-02 DIAGNOSIS — R293 Abnormal posture: Secondary | ICD-10-CM | POA: Diagnosis not present

## 2019-12-02 NOTE — Therapy (Signed)
[place], [place], [address] Phone: [phone]   Fax:  [phone]  Physical Therapy Evaluation  Patient Details  Name: Amber [name] MRN: [id number] Date of Birth: Jul 26, 1970 Referring Provider (PT): Donne Hazel   Encounter Date: 12/02/2019   PT End of Session - 12/02/19 0847    Visit Number 1    Number of Visits 2    Date for PT Re-Evaluation 01/27/20    PT Start Time 0811   pt arrived late   PT Stop Time 0845    PT Time Calculation (min) 34 min    Activity Tolerance Patient tolerated treatment well    Behavior During Therapy [facility] for tasks assessed/performed           History reviewed. No pertinent past medical history.  Past Surgical History:  Procedure Laterality Date   BREAST BIOPSY Right 2019   none      There were no vitals filed for this visit.    Subjective Assessment - 12/02/19 0814    Subjective They found some more spots last Friday in both breasts.    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 No/denies    Pain Score 0-No pain              [facility] PT Assessment - 12/02/19 0001      Assessment   Medical Diagnosis R breast cancer    Referring Provider (PT) [place]    Onset Date/Surgical Date 11/20/19   diagnosis date   Hand Dominance Right    Prior Therapy none      Precautions   Precautions Other (comment)    Precaution Comments active cancer      Restrictions   Weight Bearing Restrictions No      Balance Screen   Has the patient fallen in the past 6 months No    Has the patient had a decrease in activity level because of a fear of falling?  No    Is the patient reluctant to leave their home because of a fear of falling?  No      Home Social worker Private residence     Living Arrangements Spouse/significant other;Children   children 14,15, and 17   Available Help at Discharge Family    Type of [place]      Prior Function   Level of Independence Independent    Vocation Full time employment    Haematologist with pulmonary and critical care    Leisure exercise several times a week- walks and goes to the gym      Cognition   Overall Cognitive Status Within Functional Limits for tasks assessed      Posture/Postural Control   Posture/Postural Control Postural limitations    Postural Limitations Rounded Shoulders      ROM / Strength   AROM / PROM / Strength AROM      AROM   AROM Assessment Site Shoulder    Right/Left Shoulder Right;Left    Right Shoulder Extension 70 Degrees    Right Shoulder Flexion 167 Degrees    Right Shoulder ABduction 179 Degrees    Right Shoulder Internal Rotation 51 Degrees    Right Shoulder External Rotation 85 Degrees    Left Shoulder Extension  75 Degrees    Left Shoulder Flexion 160 Degrees    Left Shoulder ABduction 177 Degrees    Left Shoulder Internal Rotation 66 Degrees    Left Shoulder External Rotation 82 Degrees             LYMPHEDEMA/ONCOLOGY QUESTIONNAIRE - 12/02/19 0001      Type   Cancer Type right breast cancer      Lymphedema Assessments   Lymphedema Assessments Upper extremities      Right Upper Extremity Lymphedema   15 cm Proximal to Olecranon Process 25.2 cm    Olecranon Process 22.7 cm    15 cm Proximal to Ulnar Styloid Process 22 cm    Just Proximal to Ulnar Styloid Process 15 cm    Across Hand at [employer] 19.2 cm    At [place] of 2nd Digit 6 cm      Left Upper Extremity Lymphedema   15 cm Proximal to Olecranon Process 25 cm    Olecranon Process 22.5 cm    15 cm Proximal to Ulnar Styloid Process 21 cm    Just Proximal to Ulnar Styloid Process 14 cm    Across Hand at [employer] 18.9 cm    At [place] of 2nd Digit 5.7 cm           L-DEX  FLOWSHEETS - 12/02/19 0800      L-DEX LYMPHEDEMA SCREENING   Measurement Type Bilateral   current only R br ca- awaiting bx results of L    L-DEX MEASUREMENT EXTREMITY Upper Extremity    POSITION  Standing    DOMINANT SIDE Right    At Risk Side Right   possibly both-awaiting biopsy results   BASELINE RIGHT 0.8    BASELINE LEFT -0.4            The patient was assessed using the L-Dex machine today to produce a lymphedema index baseline score. The patient will be reassessed on a regular basis (typically every 3 months) to obtain new L-Dex scores. If the score is > 6.5 points away from his/her baseline score indicating onset of subclinical lymphedema, it will be recommended to wear a compression garment for 4 weeks, 12 hours per day and then be reassessed. If the score continues to be > 6.5 points from baseline at reassessment, we will initiate lymphedema treatment. Assessing in this manner has a 95% rate of preventing clinically significant lymphedema.      Objective measurements completed on examination: See above findings.               PT Education - 12/02/19 0855    Education Details anatomy and physiology of lymphatic system, lymphedema risk reduction practices, ABC class, post op breast exercises    Person(s) Educated Patient    Methods Explanation;Handout    Comprehension Verbalized understanding               PT Long Term Goals - 12/02/19 0851      PT LONG TERM GOAL #1   Title Pt will return to baseline ROM measurements for shoulder to allow pt to return to PLOF.    Time 8    Period Weeks    Status New    Target Date 01/27/20           Breast Clinic Goals - 12/02/19 0851      Patient will be able to verbalize understanding of pertinent lymphedema risk reduction practices relevant to her diagnosis specifically related to skin  care.   Time 1    Period Days    Status Achieved      Patient will be able to return demonstrate and/or verbalize  understanding of the post-op home exercise program related to regaining shoulder range of motion.   Time 1    Period Days    Status Achieved      Patient will be able to verbalize understanding of the importance of attending the postoperative After Breast Cancer Class for further lymphedema risk reduction education and therapeutic exercise.   Time 1    Period Days    Status Achieved                 Plan - 12/02/19 0848    Clinical Impression Statement Pt presents to PT with recently diagnosed R breast cancer. She just found out that she also has more areas of concern on the left and is awaiting results to see if she has left sided breast cancer as well. Pt currently does not have a surgery date and is unsure if she will need chemo and radiation.Pt was instructed in post op exercises and educated that if she has a mastectomy then not to begin exercises until a week after the last drain is removed. Baseline ROM and SOZO measurements taken today. She will benefit from a post op PT reassessment to determine needs and in every 3 months for additional L dex screening to detect subclinical lymphedema.    Stability/Clinical Decision Making Stable/Uncomplicated    Clinical Decision Making Low    Rehab Potential Good    PT Frequency --   eval and 1 f/u visit   PT Duration 8 weeks    PT Treatment/Interventions ADLs/Self Care Home Management;Therapeutic exercise;Patient/family education    PT Next Visit Plan reassess baselines post op- see if pt signed up for ABC class    PT Home Exercise Plan post op breast exercises    Consulted and Agree with Plan of Care Patient           Patient will benefit from skilled therapeutic intervention in order to improve the following deficits and impairments:  Postural dysfunction, Decreased knowledge of precautions  Visit Diagnosis: Abnormal posture - Plan: PT plan of care cert/re-cert  Malignant neoplasm of right breast in female, estrogen receptor  positive, unspecified site of breast ([place]) - Plan: PT plan of care cert/re-cert   Patient will follow up at outpatient cancer rehab 3-4 weeks following surgery.  If the patient requires physical therapy at that time, a specific plan will be dictated and sent to the referring physician for approval. The patient was educated today on appropriate basic range of motion exercises to begin post operatively and the importance of attending the After Breast Cancer class following surgery.  Patient was educated today on lymphedema risk reduction practices as it pertains to recommendations that will benefit the patient immediately following surgery.  She verbalized good understanding.     Problem List Patient Active Problem List   Diagnosis Date Noted   Malignant neoplasm of upper-outer quadrant of right breast in female, estrogen receptor positive ([place]) 11/24/2019   Abdominal pain 06/17/2018   Flu-like symptoms 06/04/2018   Obstructive sleep apnea 11/13/2017   Extensor tendinitis of foot 07/31/2013    Allyson Sabal [facility] 12/02/2019, 8:56 AM  [place], [place], [address] Phone: [phone]   Fax:  [phone]  Name: Amber [name] MRN: [id number] Date of Birth: 1970-12-23  Allyson Sabal [place], [place] 12/02/19 8:56 AM

## 2019-12-05 ENCOUNTER — Telehealth: Payer: Self-pay | Admitting: Genetic Counselor

## 2019-12-05 NOTE — Telephone Encounter (Signed)
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  Discussed that we do not know why she has breast cancer or why there is cancer in the family. It could be sporadic, due to a different gene that we are not testing, or maybe our current technology may not be able to pick something up.  It will be important for her to keep in contact with genetics to keep up with whether additional testing may be needed.

## 2019-12-08 ENCOUNTER — Other Ambulatory Visit: Payer: Self-pay | Admitting: Body Imaging

## 2019-12-08 ENCOUNTER — Encounter: Payer: Self-pay | Admitting: Genetic Counselor

## 2019-12-08 ENCOUNTER — Ambulatory Visit
Admission: RE | Admit: 2019-12-08 | Discharge: 2019-12-08 | Disposition: A | Discharge status: Home / Self Care | Payer: 59 | Source: Ambulatory Visit | Attending: General Surgery | Admitting: General Surgery

## 2019-12-08 ENCOUNTER — Ambulatory Visit: Payer: Self-pay | Admitting: Genetic Counselor

## 2019-12-08 ENCOUNTER — Ambulatory Visit: Admission: RE | Admit: 2019-12-08 | Payer: 59 | Source: Ambulatory Visit

## 2019-12-08 ENCOUNTER — Other Ambulatory Visit: Payer: Self-pay | Admitting: General Surgery

## 2019-12-08 ENCOUNTER — Other Ambulatory Visit: Payer: Self-pay

## 2019-12-08 DIAGNOSIS — R9389 Abnormal findings on diagnostic imaging of other specified body structures: Secondary | ICD-10-CM

## 2019-12-08 DIAGNOSIS — Z1379 Encounter for other screening for genetic and chromosomal anomalies: Secondary | ICD-10-CM | POA: Insufficient documentation

## 2019-12-08 DIAGNOSIS — R928 Other abnormal and inconclusive findings on diagnostic imaging of breast: Secondary | ICD-10-CM | POA: Diagnosis not present

## 2019-12-08 DIAGNOSIS — N62 Hypertrophy of breast: Secondary | ICD-10-CM | POA: Diagnosis not present

## 2019-12-08 HISTORY — PX: BREAST BIOPSY: SHX20

## 2019-12-08 MED ORDER — GADOBUTROL 1 MMOL/ML IV SOLN
8.0000 mL | Freq: Once | INTRAVENOUS | Status: AC | PRN
  Administered 2019-12-08: 8 mL via INTRAVENOUS

## 2019-12-08 NOTE — Progress Notes (Signed)
HPI:  Ms. Dimichele was previously seen in the [facility] due to a personal and family history of breast cancer and concerns regarding a hereditary predisposition to cancer. Please refer to our prior cancer genetics clinic note for more information regarding our discussion, assessment and recommendations, at the time. Ms. Booton's recent genetic test results were disclosed to her, as were recommendations warranted by these results. These results and recommendations are discussed in more detail below.  CANCER HISTORY:  In October 2021, at the age of 49, Ms. Weist was diagnosed with invasive ductal carcinoma of the right breast. The preliminary treatment plan includes surgery and Oncotype.  Oncology History  Malignant neoplasm of upper-outer quadrant of right breast in female, estrogen receptor positive (HCC)  11/20/2019 Cancer Staging   Staging form: Breast, AJCC 8th Edition - Clinical stage from 11/20/2019: Stage IA (cT1c, cN0, cM0, G1, ER+, PR+, HER2-) - Signed by Causey, Lindsey Cornetto, NP on 11/26/2019   11/24/2019 Initial Diagnosis   Malignant neoplasm of upper-outer quadrant of right breast in female, estrogen receptor positive (HCC)    Genetic Testing   Negative genetic testing: no pathogenic variants detected in [employer] Common Hereditary Cancers Panel.  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  The report date is December 04, 2019.   The Common Hereditary Cancers Panel offered by [facility] includes sequencing and/or deletion duplication testing of the following 48 genes: APC, ATM, AXIN2, BARD1, BMPR1A, BRCA1, BRCA2, BRIP1, CDH1, CDK4, CDKN2A (p14ARF), CDKN2A (p16INK4a), CHEK2, CTNNA1, DICER1, EPCAM (Deletion/duplication testing only), GREM1 (promoter region deletion/duplication testing only), KIT, MEN1, MLH1, MSH2, MSH3, MSH6, MUTYH, NBN, NF1, NHTL1, PALB2, PDGFRA, PMS2, POLD1, POLE, PTEN, RAD50, RAD51C, RAD51D, RNF43, SDHB, SDHC, SDHD, SMAD4,  SMARCA4. STK11, TP53, TSC1, TSC2, and VHL.  The following genes were evaluated for sequence changes only: SDHA and HOXB13 c.251G>A variant only.     FAMILY HISTORY:  We obtained a detailed, 4-generation family history.  Significant diagnoses are listed below: Family History  Problem Relation Age of Onset  . Breast cancer Paternal Aunt        dx after 50, triple negative  . Lung cancer Maternal Grandfather 75     Ms. Nazir has two daughter and one son.  Ms. Lycan has one sister, age 43, who does not have cancer.  She also has several paternal half siblings and has limited information about their health.  Ms. Gomm's mother is 68 and does not have a cancer history.  [name]'s maternal grandfather was diagnosed with lung cancer at age 75. No other maternal family history of cancer was reported.  Ms. Benfer's father passed away at age 40 and did not have cancer.  She has a paternal aunt diagnosed with triple negative breast cancer after the age of 50.  She has limited information about her paternal family members but did not report other cancer.   Ms. Rennels is unaware of previous family history of genetic testing for hereditary cancer risks. Patient's maternal ancestors are of White/Caucasian descent, and paternal ancestors are of White/Caucasian descent. There is no reported Ashkenazi Jewish ancestry. There is no known consanguinity.  GENETIC TEST RESULTS: Genetic testing reported out on December 04, 2019.  The [facility] Common Hereditary Cancers Panel found no pathogenic mutations.  The Common Hereditary Cancers Panel offered by [facility] includes sequencing and/or deletion duplication testing of the following 48 genes: APC, ATM, AXIN2, BARD1, BMPR1A, BRCA1, BRCA2, BRIP1, CDH1, CDK4, CDKN2A (p14ARF), CDKN2A (p16INK4a), CHEK2, CTNNA1, DICER1, EPCAM (Deletion/duplication   testing only), GREM1 (promoter region deletion/duplication testing only), KIT, MEN1, MLH1, MSH2, MSH3, MSH6, MUTYH, NBN, NF1,  NHTL1, PALB2, PDGFRA, PMS2, POLD1, POLE, PTEN, RAD50, RAD51C, RAD51D, RNF43, SDHB, SDHC, SDHD, SMAD4, SMARCA4. STK11, TP53, TSC1, TSC2, and VHL.  The following genes were evaluated for sequence changes only: SDHA and HOXB13 c.251G>A variant only.  The test report has been scanned into EPIC and is located under the Molecular Pathology section of the Results Review tab.  A portion of the result report is included below for reference.     We discussed with Ms. Pavlov that because current genetic testing is not perfect, it is possible there may be a gene mutation in one of these genes that current testing cannot detect, but that chance is small.  We also discussed, that there could be another gene that has not yet been discovered, or that we have not yet tested, that is responsible for the cancer diagnoses in the family. It is also possible there is a hereditary cause for the cancer in the family that Ms. Nations did not inherit and therefore was not identified in her testing.  Therefore, it is important to remain in touch with cancer genetics in the future so that we can continue to offer Ms. Hotz the most up to date genetic testing.   Genetic testing did identify a variant of uncertain significance (VUS) was identified in the ATM gene called c.5081C>G (p.Ala1694Gly).  At this time, it is unknown if this variant is associated with increased cancer risk or if this is a normal finding, but most variants such as this get reclassified to being inconsequential. It should not be used to make medical management decisions. With time, we suspect the lab will determine the significance of this variant, if any. If we do learn more about it, we will try to contact Ms. Fogel to discuss it further. However, it is important to stay in touch with us periodically and keep the address and phone number up to date.   ADDITIONAL GENETIC TESTING: We discussed with Ms. Ornstein that there are other genes that are associated  with increased cancer risk that can be analyzed. Should Ms. Volkov wish to pursue additional genetic testing, we are happy to discuss and coordinate this testing, at any time.    CANCER SCREENING RECOMMENDATIONS: Ms. Spraggins's test result is considered negative (normal).  This means that we have not identified a hereditary cause for her personal history of breast cancer at this time. Most cancers happen by chance and this negative test suggests that her cancer may fall into this category.    While reassuring, this does not definitively rule out a hereditary predisposition to cancer. It is still possible that there could be genetic mutations that are undetectable by current technology. There could be genetic mutations in genes that have not been tested or identified to increase cancer risk.  Therefore, it is recommended she continue to follow the cancer management and screening guidelines provided by her oncology and primary healthcare provider.   An individual's cancer risk and medical management are not determined by genetic test results alone. Overall cancer risk assessment incorporates additional factors, including personal medical history, family history, and any available genetic information that may result in a personalized plan for cancer prevention and surveillance  RECOMMENDATIONS FOR FAMILY MEMBERS:  Individuals in this family might be at some increased risk of developing cancer, over the general population risk, simply due to the family history of cancer.  We recommended women   in this family have a yearly mammogram beginning at age 40, or 10 years younger than the earliest onset of cancer, an annual clinical breast exam, and perform monthly breast self-exams. Women in this family should also have a gynecological exam as recommended by their primary provider. All family members should be referred for colonoscopy starting at age 45.  It is also possible there is a hereditary cause for the cancer  in Ms. Buchberger's family that she did not inherit and therefore was not identified in her.  Based on Ms. Smyth's family history, we recommended her paternal aunt, who was diagnosed with triple negative breast cancer have genetic counseling and testing. Ms. Knittle will let us know if we can be of any assistance in coordinating genetic counseling and/or testing for this family member.   FOLLOW-UP: Lastly, we discussed with Ms. Caselli that cancer genetics is a rapidly advancing field and it is possible that new genetic tests will be appropriate for her and/or her family members in the future. We encouraged her to remain in contact with cancer genetics on an annual basis so we can update her personal and family histories and let her know of advances in cancer genetics that may benefit this family.   Our contact number was provided. Ms. Stembridge's questions were answered to her satisfaction, and she knows she is welcome to call us at anytime with additional questions or concerns.   Cari M. Koerner, MS, CGC Certified Genetic Counselor [email or web address] (P) [phone]  

## 2019-12-09 ENCOUNTER — Encounter: Payer: Self-pay | Admitting: *Deleted

## 2019-12-12 DIAGNOSIS — C50411 Malignant neoplasm of upper-outer quadrant of right female breast: Secondary | ICD-10-CM | POA: Diagnosis not present

## 2019-12-12 DIAGNOSIS — Z170 Estrogen receptor positive status [ER+]: Secondary | ICD-10-CM | POA: Diagnosis not present

## 2019-12-15 ENCOUNTER — Encounter: Payer: Self-pay | Admitting: *Deleted

## 2019-12-16 ENCOUNTER — Other Ambulatory Visit: Payer: Self-pay | Admitting: General Surgery

## 2019-12-16 DIAGNOSIS — Z170 Estrogen receptor positive status [ER+]: Secondary | ICD-10-CM

## 2019-12-16 DIAGNOSIS — C50411 Malignant neoplasm of upper-outer quadrant of right female breast: Secondary | ICD-10-CM

## 2019-12-18 ENCOUNTER — Encounter: Payer: Self-pay | Admitting: *Deleted

## 2019-12-23 ENCOUNTER — Other Ambulatory Visit: Payer: Self-pay

## 2019-12-23 ENCOUNTER — Encounter (HOSPITAL_BASED_OUTPATIENT_CLINIC_OR_DEPARTMENT_OTHER): Payer: Self-pay | Admitting: General Surgery

## 2019-12-24 ENCOUNTER — Encounter: Payer: Self-pay | Admitting: *Deleted

## 2019-12-25 ENCOUNTER — Encounter (HOSPITAL_BASED_OUTPATIENT_CLINIC_OR_DEPARTMENT_OTHER): Payer: Self-pay | Admitting: General Surgery

## 2019-12-26 ENCOUNTER — Other Ambulatory Visit (HOSPITAL_COMMUNITY): Payer: 59

## 2019-12-30 ENCOUNTER — Other Ambulatory Visit (HOSPITAL_COMMUNITY)
Admission: RE | Admit: 2019-12-30 | Discharge: 2019-12-30 | Disposition: A | Discharge status: Home / Self Care | Payer: 59 | Source: Ambulatory Visit | Attending: General Surgery | Admitting: General Surgery

## 2019-12-30 ENCOUNTER — Encounter (HOSPITAL_COMMUNITY): Payer: 59

## 2019-12-30 DIAGNOSIS — Z01812 Encounter for preprocedural laboratory examination: Secondary | ICD-10-CM | POA: Diagnosis not present

## 2019-12-30 DIAGNOSIS — Z20822 Contact with and (suspected) exposure to covid-19: Secondary | ICD-10-CM | POA: Diagnosis not present

## 2019-12-30 LAB — SARS CORONAVIRUS 2 (TAT 6-24 HRS): SARS Coronavirus 2: NEGATIVE

## 2020-01-01 ENCOUNTER — Ambulatory Visit
Admission: RE | Admit: 2020-01-01 | Discharge: 2020-01-01 | Disposition: A | Discharge status: Home / Self Care | Payer: 59 | Source: Ambulatory Visit | Attending: General Surgery | Admitting: General Surgery

## 2020-01-01 ENCOUNTER — Other Ambulatory Visit: Payer: Self-pay

## 2020-01-01 ENCOUNTER — Other Ambulatory Visit: Payer: Self-pay | Admitting: General Surgery

## 2020-01-01 DIAGNOSIS — C50411 Malignant neoplasm of upper-outer quadrant of right female breast: Secondary | ICD-10-CM

## 2020-01-01 DIAGNOSIS — Z170 Estrogen receptor positive status [ER+]: Secondary | ICD-10-CM

## 2020-01-01 DIAGNOSIS — N6311 Unspecified lump in the right breast, upper outer quadrant: Secondary | ICD-10-CM | POA: Diagnosis not present

## 2020-01-01 MED ORDER — ENSURE PRE-SURGERY PO LIQD: 296.0000 mL | Freq: Once | ORAL | Status: DC

## 2020-01-01 NOTE — Progress Notes (Signed)

## 2020-01-02 ENCOUNTER — Ambulatory Visit (HOSPITAL_BASED_OUTPATIENT_CLINIC_OR_DEPARTMENT_OTHER): Payer: 59 | Admitting: Anesthesiology

## 2020-01-02 ENCOUNTER — Other Ambulatory Visit: Payer: Self-pay

## 2020-01-02 ENCOUNTER — Encounter (HOSPITAL_BASED_OUTPATIENT_CLINIC_OR_DEPARTMENT_OTHER)
Admission: RE | Disposition: A | Discharge status: Home / Self Care | Payer: Self-pay | Source: Home / Self Care | Attending: General Surgery

## 2020-01-02 ENCOUNTER — Ambulatory Visit
Admission: RE | Admit: 2020-01-02 | Discharge: 2020-01-02 | Disposition: A | Discharge status: Home / Self Care | Payer: 59 | Source: Ambulatory Visit | Attending: General Surgery | Admitting: General Surgery

## 2020-01-02 ENCOUNTER — Other Ambulatory Visit (HOSPITAL_BASED_OUTPATIENT_CLINIC_OR_DEPARTMENT_OTHER): Payer: Self-pay | Admitting: General Surgery

## 2020-01-02 ENCOUNTER — Ambulatory Visit (HOSPITAL_COMMUNITY): Payer: 59

## 2020-01-02 ENCOUNTER — Ambulatory Visit (HOSPITAL_COMMUNITY)
Admission: RE | Admit: 2020-01-02 | Discharge: 2020-01-02 | Disposition: A | Discharge status: Home / Self Care | Payer: 59 | Source: Ambulatory Visit | Attending: General Surgery | Admitting: General Surgery

## 2020-01-02 ENCOUNTER — Ambulatory Visit (HOSPITAL_BASED_OUTPATIENT_CLINIC_OR_DEPARTMENT_OTHER)
Admission: RE | Admit: 2020-01-02 | Discharge: 2020-01-02 | Disposition: A | Discharge status: Home / Self Care | Payer: 59 | Attending: General Surgery | Admitting: General Surgery

## 2020-01-02 ENCOUNTER — Encounter (HOSPITAL_BASED_OUTPATIENT_CLINIC_OR_DEPARTMENT_OTHER): Payer: Self-pay | Admitting: General Surgery

## 2020-01-02 DIAGNOSIS — C50411 Malignant neoplasm of upper-outer quadrant of right female breast: Secondary | ICD-10-CM | POA: Diagnosis not present

## 2020-01-02 DIAGNOSIS — Z170 Estrogen receptor positive status [ER+]: Secondary | ICD-10-CM

## 2020-01-02 DIAGNOSIS — G8918 Other acute postprocedural pain: Secondary | ICD-10-CM | POA: Diagnosis not present

## 2020-01-02 DIAGNOSIS — C50911 Malignant neoplasm of unspecified site of right female breast: Secondary | ICD-10-CM | POA: Diagnosis not present

## 2020-01-02 DIAGNOSIS — G4733 Obstructive sleep apnea (adult) (pediatric): Secondary | ICD-10-CM | POA: Diagnosis not present

## 2020-01-02 DIAGNOSIS — N6081 Other benign mammary dysplasias of right breast: Secondary | ICD-10-CM | POA: Diagnosis not present

## 2020-01-02 DIAGNOSIS — N6011 Diffuse cystic mastopathy of right breast: Secondary | ICD-10-CM | POA: Diagnosis not present

## 2020-01-02 DIAGNOSIS — R928 Other abnormal and inconclusive findings on diagnostic imaging of breast: Secondary | ICD-10-CM | POA: Diagnosis not present

## 2020-01-02 HISTORY — PX: BREAST LUMPECTOMY: SHX2

## 2020-01-02 HISTORY — PX: BREAST LUMPECTOMY WITH RADIOACTIVE SEED AND SENTINEL LYMPH NODE BIOPSY: SHX6550

## 2020-01-02 HISTORY — DX: Sleep apnea, unspecified: G47.30

## 2020-01-02 HISTORY — DX: Deviated nasal septum: J34.2

## 2020-01-02 LAB — POCT PREGNANCY, URINE: Preg Test, Ur: NEGATIVE

## 2020-01-02 SURGERY — BREAST LUMPECTOMY WITH RADIOACTIVE SEED AND SENTINEL LYMPH NODE BIOPSY
Anesthesia: General | Site: Breast | Laterality: Right

## 2020-01-02 MED ORDER — MEPERIDINE HCL 25 MG/ML IJ SOLN: 6.2500 mg | INTRAMUSCULAR | Status: DC | PRN

## 2020-01-02 MED ORDER — CEFAZOLIN SODIUM-DEXTROSE 2-4 GM/100ML-% IV SOLN
INTRAVENOUS | Status: AC
  Filled 2020-01-02: qty 100

## 2020-01-02 MED ORDER — AMISULPRIDE (ANTIEMETIC) 5 MG/2ML IV SOLN
5.0000 mg | Freq: Once | INTRAVENOUS | Status: AC
  Administered 2020-01-02: 5 mg via INTRAVENOUS

## 2020-01-02 MED ORDER — FENTANYL CITRATE (PF) 100 MCG/2ML IJ SOLN
INTRAMUSCULAR | Status: AC
  Filled 2020-01-02: qty 2

## 2020-01-02 MED ORDER — BUPIVACAINE HCL (PF) 0.25 % IJ SOLN
INTRAMUSCULAR | Status: DC | PRN
  Administered 2020-01-02: 2 mL

## 2020-01-02 MED ORDER — PROMETHAZINE HCL 25 MG/ML IJ SOLN: 6.2500 mg | INTRAMUSCULAR | Status: DC | PRN

## 2020-01-02 MED ORDER — ROPIVACAINE HCL 5 MG/ML IJ SOLN
INTRAMUSCULAR | Status: DC | PRN
  Administered 2020-01-02: 30 mL via PERINEURAL

## 2020-01-02 MED ORDER — HYDROMORPHONE HCL 1 MG/ML IJ SOLN: 0.2500 mg | INTRAMUSCULAR | Status: DC | PRN

## 2020-01-02 MED ORDER — AMISULPRIDE (ANTIEMETIC) 5 MG/2ML IV SOLN
INTRAVENOUS | Status: AC
  Filled 2020-01-02: qty 2

## 2020-01-02 MED ORDER — MIDAZOLAM HCL 2 MG/2ML IJ SOLN
INTRAMUSCULAR | Status: AC
  Filled 2020-01-02: qty 2

## 2020-01-02 MED ORDER — FENTANYL CITRATE (PF) 100 MCG/2ML IJ SOLN
INTRAMUSCULAR | Status: DC | PRN
  Administered 2020-01-02 (×2): 50 ug via INTRAVENOUS

## 2020-01-02 MED ORDER — LIDOCAINE 2% (20 MG/ML) 5 ML SYRINGE
INTRAMUSCULAR | Status: DC | PRN
  Administered 2020-01-02: 60 mg via INTRAVENOUS

## 2020-01-02 MED ORDER — KETOROLAC TROMETHAMINE 15 MG/ML IJ SOLN
INTRAMUSCULAR | Status: AC
  Filled 2020-01-02: qty 1

## 2020-01-02 MED ORDER — LIDOCAINE 2% (20 MG/ML) 5 ML SYRINGE
INTRAMUSCULAR | Status: AC
  Filled 2020-01-02: qty 10

## 2020-01-02 MED ORDER — OXYCODONE HCL 5 MG PO TABS: 5.0000 mg | ORAL_TABLET | Freq: Once | ORAL | Status: DC | PRN

## 2020-01-02 MED ORDER — PROPOFOL 10 MG/ML IV BOLUS
INTRAVENOUS | Status: DC | PRN
  Administered 2020-01-02: 200 mg via INTRAVENOUS

## 2020-01-02 MED ORDER — TRAMADOL HCL 50 MG PO TABS
50.0000 mg | ORAL_TABLET | Freq: Four times a day (QID) | ORAL | 0 refills | Status: DC | PRN
Start: 2020-01-02 — End: 2020-01-26

## 2020-01-02 MED ORDER — CEFAZOLIN SODIUM-DEXTROSE 2-4 GM/100ML-% IV SOLN
2.0000 g | INTRAVENOUS | Status: AC
  Administered 2020-01-02: 2 g via INTRAVENOUS

## 2020-01-02 MED ORDER — TECHNETIUM TC 99M TILMANOCEPT KIT
1.0000 | PACK | Freq: Once | INTRAVENOUS | Status: AC | PRN
  Administered 2020-01-02: 1 via INTRADERMAL

## 2020-01-02 MED ORDER — KETOROLAC TROMETHAMINE 15 MG/ML IJ SOLN
15.0000 mg | INTRAMUSCULAR | Status: AC
  Administered 2020-01-02: 15 mg via INTRAVENOUS

## 2020-01-02 MED ORDER — SCOPOLAMINE 1 MG/3DAYS TD PT72
1.0000 | MEDICATED_PATCH | TRANSDERMAL | Status: DC
  Administered 2020-01-02: 1.5 mg via TRANSDERMAL

## 2020-01-02 MED ORDER — DEXAMETHASONE SODIUM PHOSPHATE 10 MG/ML IJ SOLN
INTRAMUSCULAR | Status: AC
  Filled 2020-01-02: qty 1

## 2020-01-02 MED ORDER — ONDANSETRON HCL 4 MG/2ML IJ SOLN
INTRAMUSCULAR | Status: DC | PRN
  Administered 2020-01-02: 4 mg via INTRAVENOUS

## 2020-01-02 MED ORDER — ACETAMINOPHEN 500 MG PO TABS
ORAL_TABLET | ORAL | Status: AC
  Filled 2020-01-02: qty 2

## 2020-01-02 MED ORDER — SCOPOLAMINE 1 MG/3DAYS TD PT72
MEDICATED_PATCH | TRANSDERMAL | Status: AC
  Filled 2020-01-02: qty 1

## 2020-01-02 MED ORDER — DEXAMETHASONE SODIUM PHOSPHATE 10 MG/ML IJ SOLN
INTRAMUSCULAR | Status: DC | PRN
  Administered 2020-01-02: 10 mg via INTRAVENOUS

## 2020-01-02 MED ORDER — ONDANSETRON HCL 4 MG/2ML IJ SOLN
INTRAMUSCULAR | Status: AC
  Filled 2020-01-02: qty 2

## 2020-01-02 MED ORDER — LACTATED RINGERS IV SOLN: INTRAVENOUS | Status: DC

## 2020-01-02 MED ORDER — FENTANYL CITRATE (PF) 100 MCG/2ML IJ SOLN
100.0000 ug | Freq: Once | INTRAMUSCULAR | Status: AC
  Administered 2020-01-02: 100 ug via INTRAVENOUS

## 2020-01-02 MED ORDER — OXYCODONE HCL 5 MG/5ML PO SOLN: 5.0000 mg | Freq: Once | ORAL | Status: DC | PRN

## 2020-01-02 MED ORDER — ACETAMINOPHEN 500 MG PO TABS
1000.0000 mg | ORAL_TABLET | ORAL | Status: AC
  Administered 2020-01-02: 1000 mg via ORAL

## 2020-01-02 MED ORDER — MIDAZOLAM HCL 2 MG/2ML IJ SOLN
2.0000 mg | Freq: Once | INTRAMUSCULAR | Status: AC
  Administered 2020-01-02: 2 mg via INTRAVENOUS

## 2020-01-02 MED FILL — traMADol HCL 50 MG TABS: 50 | 3 days supply | Qty: 10 | Fill #0

## 2020-01-02 SURGICAL SUPPLY — 60 items
ADH SKN CLS APL DERMABOND .7 (GAUZE/BANDAGES/DRESSINGS) ×1
APL PRP STRL LF DISP 70% ISPRP (MISCELLANEOUS) ×1
APPLIER CLIP 9.375 MED OPEN (MISCELLANEOUS)
APR CLP MED 9.3 20 MLT OPN (MISCELLANEOUS)
BINDER BREAST LRG (GAUZE/BANDAGES/DRESSINGS) IMPLANT
BINDER BREAST MEDIUM (GAUZE/BANDAGES/DRESSINGS) ×3 IMPLANT
BINDER BREAST XLRG (GAUZE/BANDAGES/DRESSINGS) IMPLANT
BINDER BREAST XXLRG (GAUZE/BANDAGES/DRESSINGS) IMPLANT
BLADE SURG 15 STRL LF DISP TIS (BLADE) ×1 IMPLANT
BLADE SURG 15 STRL SS (BLADE) ×3
CANISTER SUC SOCK COL 7IN (MISCELLANEOUS) IMPLANT
CANISTER SUCT 1200ML W/VALVE (MISCELLANEOUS) IMPLANT
CHLORAPREP W/TINT 26 (MISCELLANEOUS) ×3 IMPLANT
CLIP APPLIE 9.375 MED OPEN (MISCELLANEOUS) IMPLANT
CLIP VESOCCLUDE SM WIDE 6/CT (CLIP) ×3 IMPLANT
CLOSURE WOUND 1/2 X4 (GAUZE/BANDAGES/DRESSINGS) ×1
COVER BACK TABLE 60X90IN (DRAPES) ×3 IMPLANT
COVER MAYO STAND STRL (DRAPES) ×3 IMPLANT
COVER PROBE W GEL 5X96 (DRAPES) ×3 IMPLANT
COVER WAND RF STERILE (DRAPES) IMPLANT
DECANTER SPIKE VIAL GLASS SM (MISCELLANEOUS) IMPLANT
DERMABOND ADVANCED (GAUZE/BANDAGES/DRESSINGS) ×2
DERMABOND ADVANCED .7 DNX12 (GAUZE/BANDAGES/DRESSINGS) ×1 IMPLANT
DRAPE LAPAROSCOPIC ABDOMINAL (DRAPES) ×3 IMPLANT
DRAPE UTILITY XL STRL (DRAPES) ×3 IMPLANT
ELECT COATED BLADE 2.86 ST (ELECTRODE) ×3 IMPLANT
ELECT REM PT RETURN 9FT ADLT (ELECTROSURGICAL) ×3
ELECTRODE REM PT RTRN 9FT ADLT (ELECTROSURGICAL) ×1 IMPLANT
GLOVE BIO SURGEON STRL SZ7 (GLOVE) ×6 IMPLANT
GLOVE BIOGEL PI IND STRL 7.5 (GLOVE) ×1 IMPLANT
GLOVE BIOGEL PI INDICATOR 7.5 (GLOVE) ×2
GOWN STRL REUS W/ TWL LRG LVL3 (GOWN DISPOSABLE) ×2 IMPLANT
GOWN STRL REUS W/TWL LRG LVL3 (GOWN DISPOSABLE) ×6
HEMOSTAT ARISTA ABSORB 3G PWDR (HEMOSTASIS) ×3 IMPLANT
KIT MARKER MARGIN INK (KITS) ×3 IMPLANT
NDL SAFETY ECLIPSE 18X1.5 (NEEDLE) IMPLANT
NEEDLE HYPO 18GX1.5 SHARP (NEEDLE)
NEEDLE HYPO 25X1 1.5 SAFETY (NEEDLE) ×3 IMPLANT
NS IRRIG 1000ML POUR BTL (IV SOLUTION) IMPLANT
PACK BASIN DAY SURGERY FS (CUSTOM PROCEDURE TRAY) ×3 IMPLANT
PENCIL SMOKE EVACUATOR (MISCELLANEOUS) ×3 IMPLANT
RETRACTOR ONETRAX LX 90X20 (MISCELLANEOUS) IMPLANT
SLEEVE SCD COMPRESS KNEE MED (MISCELLANEOUS) ×3 IMPLANT
SPONGE LAP 4X18 RFD (DISPOSABLE) ×3 IMPLANT
STRIP CLOSURE SKIN 1/2X4 (GAUZE/BANDAGES/DRESSINGS) ×2 IMPLANT
SUT ETHILON 2 0 FS 18 (SUTURE) IMPLANT
SUT MNCRL AB 4-0 PS2 18 (SUTURE) ×6 IMPLANT
SUT MON AB 5-0 PS2 18 (SUTURE) IMPLANT
SUT SILK 2 0 SH (SUTURE) IMPLANT
SUT VIC AB 2-0 SH 27 (SUTURE) ×6
SUT VIC AB 2-0 SH 27XBRD (SUTURE) ×2 IMPLANT
SUT VIC AB 3-0 SH 27 (SUTURE) ×3
SUT VIC AB 3-0 SH 27X BRD (SUTURE) ×1 IMPLANT
SUT VIC AB 5-0 PS2 18 (SUTURE) IMPLANT
SYR CONTROL 10ML LL (SYRINGE) ×3 IMPLANT
TOWEL GREEN STERILE FF (TOWEL DISPOSABLE) ×3 IMPLANT
TRAY FAXITRON CT DISP (TRAY / TRAY PROCEDURE) ×3 IMPLANT
TUBE CONNECTING 20'X1/4 (TUBING)
TUBE CONNECTING 20X1/4 (TUBING) IMPLANT
YANKAUER SUCT BULB TIP NO VENT (SUCTIONS) IMPLANT

## 2020-01-02 NOTE — Anesthesia Procedure Notes (Signed)
Anesthesia Regional Block: Pectoralis block   Pre-Anesthetic Checklist: ,, timeout performed, Correct Patient, Correct Site, Correct Laterality, Correct Procedure, Correct Position, site marked, Risks and benefits discussed,  Surgical consent,  Pre-op evaluation,  At surgeon's request and post-op pain management  Laterality: Right  Prep: chloraprep       Needles:  Injection technique: Single-shot  Needle Type: Stimiplex     Needle Length: 9cm  Needle Gauge: 21     Additional Needles:   Procedures:,,,, ultrasound used (permanent image in chart),,,,  Narrative:  Start time: 01/02/2020 11:56 AM End time: 01/02/2020 12:01 PM Injection made incrementally with aspirations every 5 mL.  Performed by: Personally  Anesthesiologist: Lynda Rainwater, MD

## 2020-01-02 NOTE — H&P (Signed)
Amber [name] is an 49 y.o. female.   Chief Complaint: right breast cancer.  HPI:  49 yof I know from previous appt when she had right breast mass that was pash and was concordant. Discussed with radiology and we elected to follow. she had follow up mm that shows c density breasts. there is right breast asymmetry and left is negative. us shows a 1.4 cm irregular mass which was larger and this was biopsied. her ax nodes were negative. biopsy shows a grade I IDC that is 95% er pos, 50% pr pos, her 2 negative and Ki is 2%. she is here to discuss options. she has fh in paternal aunt in 50s. she has no dc. she is np with pulmonary. Mri had another abnormality that is benign.    Past Medical History:  Diagnosis Date  . Deviated septum   . Malignant neoplasm of upper-outer quadrant of right breast in female, estrogen receptor positive (HCC) 11/24/2019  . Sleep apnea     Past Surgical History:  Procedure Laterality Date  . BREAST BIOPSY Right 2019  . DILATION AND EVACUATION  07/14/2001   miscarriage  . ENDOMETRIAL ABLATION W/ NOVASURE      Family History  Problem Relation Age of Onset  . Breast cancer Paternal Aunt        dx after 50, triple negative  . Arthritis Mother   . GER disease Mother   . Arthritis Sister   . Lung cancer Maternal Grandfather 75   Social History:  reports that she has never smoked. She has never used smokeless tobacco. She reports previous alcohol use. She reports that she does not use drugs.  Allergies: No Known Allergies  No medications prior to admission.    No results found for this or any previous visit (from the past 48 hour(s)). US RT RADIOACTIVE SEED LOC  Result Date: 01/01/2020 CLINICAL DATA:  49-year-old female presenting for radioactive seed localization of the right breast prior to lumpectomy. EXAM: ULTRASOUND GUIDED RADIOACTIVE SEED LOCALIZATION OF THE RIGHT BREAST COMPARISON:  Previous exam(s). FINDINGS: Patient presents for radioactive  seed localization prior to right breast lumpectomy. I met with the patient and we discussed the procedure of seed localization including benefits and alternatives. We discussed the high likelihood of a successful procedure. We discussed the risks of the procedure including infection, bleeding, tissue injury and further surgery. We discussed the low dose of radioactivity involved in the procedure. Informed, written consent was given. The usual time-out protocol was performed immediately prior to the procedure. Using ultrasound guidance, sterile technique, 1% lidocaine and an I-125 radioactive seed, the mass in the right breast at 10 was localized using an inferior approach. The follow-up mammogram images confirm the seed in the expected location and were marked for Dr. Wakefield. Follow-up survey of the patient confirms presence of the radioactive seed. Order number of I-125 seed:  [id number]. Total activity:  0.260 millicuries reference Date: 12/24/2019 The patient tolerated the procedure well and was [facility]. She was given instructions regarding seed removal. IMPRESSION: Radioactive seed localization right breast. No apparent complications. Electronically Signed   By: Michelle  Collins M.D.   On: 01/01/2020 14:55   MM CLIP PLACEMENT RIGHT  Result Date: 01/01/2020 CLINICAL DATA:  Mammogram status post radioactive seed placement in the right breast EXAM: DIAGNOSTIC RIGHT MAMMOGRAM POST ULTRASOUND-GUIDED RADIOACTIVE SEED PLACEMENT COMPARISON:  Previous exam(s). FINDINGS: Mammographic images were obtained following ultrasound-guided radioactive seed placement. These demonstrate that the radioactive seed is   well positioned at the site of the mass with the 2 biopsy marking clips in the upper-outer posterior right breast. IMPRESSION: Appropriate location of the radioactive seed in the upper-outer right breast. Final Assessment: Post Procedure Mammograms for Seed Placement Electronically Signed    By: Ammie Ferrier M.D.   On: 01/01/2020 14:57    Review of Systems  All other systems reviewed and are negative.   Height 5' 8" (1.727 m), weight 61.2 kg, last menstrual period 12/10/2019. Physical Exam  General Mental Status-Alert. Orientation-Oriented X3. Breast Nipples-No Discharge. Breast Lump-No Palpable Breast Mass. Lymphatic Head & Neck General Head & Neck Lymphatics: Bilateral - Description - Normal. Axillary General Axillary Region: Bilateral - Description - Normal. 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 We discussed all of the different options for treatment for breast cancer including surgery, chemotherapy, radiation therapy, Herceptin, and antiestrogen therapy. We discussed a sentinel lymph node biopsy as she does not appear to having lymph node involvement right now. We discussed the performance of that with injection of radioactive tracer. We discussed up to a 5% risk lifetime of chronic shoulder pain as well as lymphedema associated with a sentinel lymph node biopsy. We discussed the options for treatment of the breast cancer which included lumpectomy versus a mastectomy. We discussed the performance of the lumpectomy with radioactive seed placement. We discussed a 5-10% chance of a positive margin requiring reexcision in the operating room. We also discussed that she will need radiation therapy if she undergoes lumpectomy. We discussed mastectomy and the postoperative care for that as well. Mastectomy can be followed by reconstruction. The decision for lumpectomy vs mastectomy has no impact on decision for chemotherapy. Most mastectomy patients will not need radiation therapy. We discussed that there is no difference in her survival whether she undergoes lumpectomy with radiation therapy  or antiestrogen therapy versus a mastectomy. There is also no real difference between her recurrence in the breast. We discussed the risks of operation including bleeding, infection, possible reoperation. She understands her further therapy will be based on what her stages at the time of her operation. will await mri and genetics the proceed  Rolm Bookbinder, MD 01/02/2020, 10:58 AM

## 2020-01-02 NOTE — Anesthesia Preprocedure Evaluation (Signed)
Anesthesia Evaluation  Patient identified by MRN, date of birth, ID band Patient awake    Reviewed: Allergy & Precautions, NPO status , Patient's Chart, lab work & pertinent test results  Airway Mallampati: II  TM Distance: >3 FB Neck ROM: Full    Dental no notable dental hx.    Pulmonary sleep apnea ,    Pulmonary exam normal breath sounds clear to auscultation       Cardiovascular negative cardio ROS Normal cardiovascular exam Rhythm:Regular Rate:Normal     Neuro/Psych negative neurological ROS  negative psych ROS   GI/Hepatic negative GI ROS, Neg liver ROS,   Endo/Other  negative endocrine ROS  Renal/GU negative Renal ROS  negative genitourinary   Musculoskeletal negative musculoskeletal ROS (+)   Abdominal   Peds negative pediatric ROS (+)  Hematology negative hematology ROS (+)   Anesthesia Other Findings Breast Cancer  Reproductive/Obstetrics negative OB ROS                             Anesthesia Physical Anesthesia Plan  ASA: III  Anesthesia Plan: General   Post-op Pain Management:  Regional for Post-op pain   Induction: Intravenous  PONV Risk Score and Plan: 3 and Ondansetron, Dexamethasone, Midazolam and Treatment may vary due to age or medical condition  Airway Management Planned: LMA  Additional Equipment:   Intra-op Plan:   Post-operative Plan: Extubation in OR  Informed Consent: I have reviewed the patients History and Physical, chart, labs and discussed the procedure including the risks, benefits and alternatives for the proposed anesthesia with the patient or authorized representative who has indicated his/her understanding and acceptance.     Dental advisory given  Plan Discussed with: CRNA  Anesthesia Plan Comments:         Anesthesia Quick Evaluation

## 2020-01-02 NOTE — Interval H&P Note (Signed)
History and Physical Interval Note:  01/02/2020 12:27 PM  Amber [name]  has presented today for surgery, with the diagnosis of RIGHT BREAST CANCER.  The various methods of treatment have been discussed with the patient and family. After consideration of risks, benefits and other options for treatment, the patient has consented to  Procedure(s) with comments: RIGHT BREAST LUMPECTOMY WITH RADIOACTIVE SEED AND RIGHT AXILLARY SENTINEL LYMPH NODE BIOPSY (Right) - PEC BLOCK as a surgical intervention.  The patient's history has been reviewed, patient examined, no change in status, stable for surgery.  I have reviewed the patient's chart and labs.  Questions were answered to the patient's satisfaction.     Rolm Bookbinder

## 2020-01-02 NOTE — Progress Notes (Signed)
Emotional support during breast injections °

## 2020-01-02 NOTE — Anesthesia Procedure Notes (Signed)
Procedure Name: LMA Insertion Date/Time: 01/02/2020 12:40 PM Performed by: [place] Clydine Parkison C, CRNA Pre-anesthesia Checklist: Patient identified, Emergency Drugs available, Suction available and Patient being monitored Patient Re-evaluated:Patient Re-evaluated prior to induction Oxygen Delivery Method: Circle system utilized Preoxygenation: Pre-oxygenation with 100% oxygen Induction Type: IV induction Ventilation: Mask ventilation without difficulty LMA: LMA inserted LMA Size: 4.0 Number of attempts: 1 Airway Equipment and Method: Bite block Placement Confirmation: positive ETCO2 Tube secured with: Tape Dental Injury: Teeth and Oropharynx as per pre-operative assessment

## 2020-01-02 NOTE — Op Note (Signed)
Preoperative diagnosis: Clinical stage I right breast cancer Postoperative diagnosis: Same as above Procedure: 1.  Right breast radioactive seed guided lumpectomy 2.  Right deep axillary sentinel lymph node biopsy Surgeon: Dr. Serita Grammes Anesthesia: General with pectoral block Specimens: 1.  Right breast tissue marked with paint containing seed and clip 2.  Additional medial and superior margins right breast lumpectomy marked short stitch superior, long stitch lateral, double stitch deep 3.  Right deep axillary sentinel lymph nodes Estimated blood loss: Minimal Complications: None Drains: None Sponge and count was correct at completion Disposition recovery stable condition  Indications:49 yof I know from previous appt when she had right breast mass that was pash and was concordant. Discussed with radiology and we elected to follow. she had follow up mm that shows c density breasts. there is right breast asymmetry and left is negative. US shows a 1.4 cm irregular mass which was larger and this was biopsied. her ax nodes were negative. biopsy shows a grade I IDC that is 95% er pos, 50% pr pos, her 2 negative and Ki is 2%. Mri had another abnormality that is benign.   We elected to proceed with lumpectomy and sentinel lymph node biopsy  Procedure: After informed consent was obtained the patient first had a radioactive seed placed.  I had these mammograms available in the operating room.  She underwent a pectoral block.  SCDs were placed.  Antibiotics were given.  She was placed under general anesthesia with an LMA.  She was prepped and draped in the standard sterile surgical fashion.  Surgical timeout was then performed.  The seed for the tumor was in the right upper outer quadrant.  This was very close to the skin so elected to make a curvilinear incision overlying the tumor.  I then used the neoprobe to guide the excision of the seed and the surrounding tissue with an attempt to get a  clear margin.  The posterior margin is now the pectoralis muscle.  Mammogram confirmed removal of the clip and the seed.  3D CT images showed that I was close in a couple of margin so I remove these and marked these as above.  I then closed down this cavity with 2-0 Vicryl.  Then was went into the axilla through this incision eventually I also had to make an axillary incision to retrieve the sentinel node as well.  The sentinel node was identified with the highest count of 352.  This was removed and there was no background radioactivity.  Hemostasis was observed.  I closed the axillary fascia with 2-0 Vicryl.  The skin was closed with 3-0 Vicryl and the 4-0 Monocryl.  I closed the axillary incision with 3-0 Vicryl for Monocryl as well.  Glue and Steri-Strips were applied to both incisions.  A binder was placed.  She tolerates well was extubated and transferred to recovery stable.

## 2020-01-02 NOTE — Discharge Instructions (Signed)
[facility] Office Phone Number [phone]  BREAST BIOPSY/ PARTIAL MASTECTOMY: POST OP INSTRUCTIONS Take 400 mg of ibuprofen every 8 hours or 650 mg tylenol every 6 hours for next 72 hours then as needed. Use ice several times daily also. Always review your discharge instruction sheet given to you by the facility where your surgery was performed.  IF YOU HAVE DISABILITY OR FAMILY LEAVE FORMS, YOU MUST BRING THEM TO THE OFFICE FOR PROCESSING.  DO NOT GIVE THEM TO YOUR DOCTOR.  1. A prescription for pain medication may be given to you upon discharge.  Take your pain medication as prescribed, if needed.  If narcotic pain medicine is not needed, then you may take acetaminophen (Tylenol), naprosyn (Alleve) or ibuprofen (Advil) as needed. 2. Take your usually prescribed medications unless otherwise directed 3. If you need a refill on your pain medication, please contact your pharmacy.  They will contact our office to request authorization.  Prescriptions will not be filled after 5pm or on week-ends. 4. You should eat very light the first 24 hours after surgery, such as soup, crackers, pudding, etc.  Resume your normal diet the day after surgery. 5. Most patients will experience some swelling and bruising in the breast.  Ice packs and a good support bra will help.  Wear the breast binder provided or a sports bra for 72 hours day and night.  After that wear a sports bra during the day until you return to the office. Swelling and bruising can take several days to resolve.  6. It is common to experience some constipation if taking pain medication after surgery.  Increasing fluid intake and taking a stool softener will usually help or prevent this problem from occurring.  A mild laxative (Milk of Magnesia or Miralax) should be taken according to package directions if there are no bowel movements after 48 hours. 7. Unless discharge instructions indicate otherwise, you may remove your bandages 48  hours after surgery and you may shower at that time.  You may have steri-strips (small skin tapes) in place directly over the incision.  These strips should be left on the skin for 7-10 days and will come off on their own.  If your surgeon used skin glue on the incision, you may shower in 24 hours.  The glue will flake off over the next 2-3 weeks.  Any sutures or staples will be removed at the office during your follow-up visit. 8. ACTIVITIES:  You may resume regular daily activities (gradually increasing) beginning the next day.  Wearing a good support bra or sports bra minimizes pain and swelling.  You may have sexual intercourse when it is comfortable. a. You may drive when you no longer are taking prescription pain medication, you can comfortably wear a seatbelt, and you can safely maneuver your car and apply brakes. b. RETURN TO WORK:  ______________________________________________________________________________________ 9. You should see your doctor in the office for a follow-up appointment approximately two weeks after your surgery.  Your doctor's nurse will typically make your follow-up appointment when she calls you with your pathology report.  Expect your pathology report 3-4 business days after your surgery.  You may call to check if you do not hear from us after three days. 10. OTHER INSTRUCTIONS: _______________________________________________________________________________________________ _____________________________________________________________________________________________________________________________________ _____________________________________________________________________________________________________________________________________ _____________________________________________________________________________________________________________________________________  WHEN TO CALL DR WAKEFIELD: 1. Fever over 101.0 2. Nausea and/or vomiting. 3. Extreme swelling or  bruising. 4. Continued bleeding from incision. 5. Increased pain, redness, or drainage from the incision.  The clinic   staff is available to answer your questions during regular business hours.  Please don't hesitate to call and ask to speak to one of the nurses for clinical concerns.  If you have a medical emergency, go to the nearest emergency room or call 911.  A surgeon from [facility] is always on call at the hospital.  For further questions, please visit [email or web address]   No Tylenol or Ibuprofen until after 5:30pm today if needed   Post Anesthesia Home Care Instructions  Activity: Get plenty of rest for the remainder of the day. A responsible individual must stay with you for 24 hours following the procedure.  For the next 24 hours, DO NOT: -Drive a car -Paediatric nurse -Drink alcoholic beverages -Take any medication unless instructed by your physician -Make any legal decisions or sign important papers.  Meals: Start with liquid foods such as gelatin or soup. Progress to regular foods as tolerated. Avoid greasy, spicy, heavy foods. If nausea and/or vomiting occur, drink only clear liquids until the nausea and/or vomiting subsides. Call your physician if vomiting continues.  Special Instructions/Symptoms: Your throat may feel dry or sore from the anesthesia or the breathing tube placed in your throat during surgery. If this causes discomfort, gargle with warm salt water. The discomfort should disappear within 24 hours.  If you had a scopolamine patch placed behind your ear for the management of post- operative nausea and/or vomiting:  1. The medication in the patch is effective for 72 hours, after which it should be removed.  Wrap patch in a tissue and discard in the trash. Wash hands thoroughly with soap and water. 2. You may remove the patch earlier than 72 hours if you experience unpleasant side effects which may include dry mouth, dizziness or  visual disturbances. 3. Avoid touching the patch. Wash your hands with soap and water after contact with the patch.

## 2020-01-02 NOTE — Transfer of Care (Signed)
Immediate Anesthesia Transfer of Care Note  Patient: Amber [name]  Procedure(s) Performed: RIGHT BREAST LUMPECTOMY WITH RADIOACTIVE SEED AND RIGHT AXILLARY SENTINEL LYMPH NODE BIOPSY (Right Breast)  Patient Location: PACU  Anesthesia Type:GA combined with regional for post-op pain  Level of Consciousness: awake, alert  and oriented  Airway & Oxygen Therapy: Patient Spontanous Breathing and Patient connected to face mask oxygen  Post-op Assessment: Report given to RN and Post -op Vital signs reviewed and stable  Post vital signs: Reviewed and stable  Last Vitals:  Vitals Value Taken Time  BP 113/68 01/02/20 1345  Temp    Pulse 83 01/02/20 1346  Resp 12 01/02/20 1346  SpO2 100 % 01/02/20 1346  Vitals shown include unvalidated device data.  Last Pain:  Vitals:   01/02/20 1110  TempSrc: Oral  PainSc: 0-No pain      Patients Stated Pain Goal: 3 (88/89/16 9450)  Complications: No complications documented.

## 2020-01-02 NOTE — Progress Notes (Signed)
Assisted Dr. Miller with right, ultrasound guided, pectoralis block. Side rails up, monitors on throughout procedure. See vital signs in flow sheet. Tolerated Procedure well. 

## 2020-01-05 ENCOUNTER — Encounter (HOSPITAL_BASED_OUTPATIENT_CLINIC_OR_DEPARTMENT_OTHER): Payer: Self-pay | Admitting: General Surgery

## 2020-01-05 NOTE — Anesthesia Postprocedure Evaluation (Signed)
Anesthesia Post Note  Patient: Amber [name]  Procedure(s) Performed: RIGHT BREAST LUMPECTOMY WITH RADIOACTIVE SEED AND RIGHT AXILLARY SENTINEL LYMPH NODE BIOPSY (Right Breast)     Patient location during evaluation: PACU Anesthesia Type: General Level of consciousness: awake and alert Pain management: pain level controlled Vital Signs Assessment: post-procedure vital signs reviewed and stable Respiratory status: spontaneous breathing, nonlabored ventilation and respiratory function stable Cardiovascular status: blood pressure returned to baseline and stable Postop Assessment: no apparent nausea or vomiting Anesthetic complications: no   No complications documented.  Last Vitals:  Vitals:   01/02/20 1400 01/02/20 1416  BP: (!) 109/58 122/87  Pulse: 86 73  Resp: 15 18  Temp:  (!) 36.2 C  SpO2: 100% 100%    Last Pain:  Vitals:   01/02/20 1416  TempSrc: Oral  PainSc: 0-No pain                 Lynda Rainwater

## 2020-01-12 ENCOUNTER — Encounter: Payer: Self-pay | Admitting: *Deleted

## 2020-01-13 LAB — SURGICAL PATHOLOGY

## 2020-01-14 ENCOUNTER — Telehealth: Payer: Self-pay

## 2020-01-14 NOTE — Telephone Encounter (Signed)
Patient had called and left a VM stating that she had her lumpectomy done on 01/02/20, and wanted to know when she would be starting radiation.   Clarified with Dawn-Breast Navigator that team was waiting on Oncotype results to determine whether chemotherapy would be recommended. Dawn verified that Oncotype had been ordered, and results should be in by 01/29/20  Returned patient's call and relayed above information. Assured her that once all results were in and final decision was made by medical oncologist, our department would reach out to her to schedule follow-up with Dr. Sondra Come and CT simulation so she can start radiation. Patient verbalized understanding and agreement of plan. Denied any other needs at this time, but knows to call clinic back should something arise.

## 2020-01-15 ENCOUNTER — Encounter: Payer: Self-pay | Admitting: *Deleted

## 2020-01-15 ENCOUNTER — Telehealth: Payer: Self-pay | Admitting: *Deleted

## 2020-01-15 NOTE — Telephone Encounter (Signed)
Received order for oncotype testing. Requisition faxed to pathology and [facility] °

## 2020-01-20 ENCOUNTER — Encounter: Payer: 59 | Admitting: Family

## 2020-01-23 DIAGNOSIS — Z170 Estrogen receptor positive status [ER+]: Secondary | ICD-10-CM | POA: Diagnosis not present

## 2020-01-23 DIAGNOSIS — C50411 Malignant neoplasm of upper-outer quadrant of right female breast: Secondary | ICD-10-CM | POA: Diagnosis not present

## 2020-01-26 ENCOUNTER — Ambulatory Visit (INDEPENDENT_AMBULATORY_CARE_PROVIDER_SITE_OTHER): Payer: 59 | Admitting: Family

## 2020-01-26 ENCOUNTER — Encounter: Payer: Self-pay | Admitting: Family

## 2020-01-26 ENCOUNTER — Other Ambulatory Visit: Payer: Self-pay

## 2020-01-26 ENCOUNTER — Telehealth: Payer: Self-pay | Admitting: Family

## 2020-01-26 VITALS — BP 105/56 | HR 72 | Temp 97.9°F | Resp 16 | Ht 68.0 in | Wt 142.0 lb

## 2020-01-26 DIAGNOSIS — D649 Anemia, unspecified: Secondary | ICD-10-CM | POA: Diagnosis not present

## 2020-01-26 DIAGNOSIS — Z0000 Encounter for general adult medical examination without abnormal findings: Secondary | ICD-10-CM

## 2020-01-26 DIAGNOSIS — D509 Iron deficiency anemia, unspecified: Secondary | ICD-10-CM

## 2020-01-26 LAB — CBC WITH DIFFERENTIAL/PLATELET
Basophils Absolute: 0 10*3/uL (ref 0.0–0.1)
Basophils Relative: 0.9 % (ref 0.0–3.0)
Eosinophils Absolute: 0.1 10*3/uL (ref 0.0–0.7)
Eosinophils Relative: 2 % (ref 0.0–5.0)
HCT: 38.3 % (ref 36.0–46.0)
Hemoglobin: 12.6 g/dL (ref 12.0–15.0)
Lymphocytes Relative: 20.9 % (ref 12.0–46.0)
Lymphs Abs: 1.1 10*3/uL (ref 0.7–4.0)
MCHC: 33 g/dL (ref 30.0–36.0)
MCV: 88.6 fl (ref 78.0–100.0)
Monocytes Absolute: 0.6 10*3/uL (ref 0.1–1.0)
Monocytes Relative: 11.4 % (ref 3.0–12.0)
Neutro Abs: 3.3 10*3/uL (ref 1.4–7.7)
Neutrophils Relative %: 64.8 % (ref 43.0–77.0)
Platelets: 222 10*3/uL (ref 150.0–400.0)
RBC: 4.32 Mil/uL (ref 3.87–5.11)
RDW: 13.5 % (ref 11.5–15.5)
WBC: 5.1 10*3/uL (ref 4.0–10.5)

## 2020-01-26 LAB — FERRITIN: Ferritin: 94.8 ng/mL (ref 10.0–291.0)

## 2020-01-26 LAB — IRON: Iron: 93 ug/dL (ref 42–145)

## 2020-01-26 NOTE — Telephone Encounter (Signed)
Also, please call Dr. Nena Alexander office to request copy of pap.

## 2020-01-26 NOTE — Patient Instructions (Signed)
Please complete lab work prior to leaving.  Take care!

## 2020-01-26 NOTE — Telephone Encounter (Signed)
Records request faxed to Dr. Marvel Plan and test request form faxed to exact sciences lab

## 2020-01-26 NOTE — Telephone Encounter (Signed)
Please call and initiate cologuard.

## 2020-01-26 NOTE — Progress Notes (Signed)
Subjective:    Patient ID: Amber [name], female    DOB: [birth year], 49 y.o.   MRN: [id number]  HPI  Patient is a 49 yr old female who presents today for cpx.  Immunizations: pfizer x 3. Flu shot completed. Tdap 2017. Diet: fair diet Exercise: stays active Colonoscopy:  Want to do the cologuard instead of colonoscopy.  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  Underwent lumpectomy on 11/19.  Margins were clear. She will undergo radiation therapy and is waiting for the results of her oncotype testing to determine if she will need to undergo chemotherapy.   Review of Systems  Constitutional: Negative for unexpected weight change.  HENT: Negative for hearing loss and rhinorrhea.   Eyes: Negative for visual disturbance.  Respiratory: Negative for cough and shortness of breath.   Cardiovascular: Positive for chest pain.  Gastrointestinal: Negative for blood in stool, constipation and diarrhea.  Genitourinary: Negative for dysuria and frequency.  Musculoskeletal: Negative for arthralgias and myalgias.  Skin: Negative for rash.  Neurological: Negative for headaches.  Hematological: Negative for adenopathy.  Psychiatric/Behavioral:       Denies depression   Past Medical History:  Diagnosis Date  . Deviated septum   . Malignant neoplasm of upper-outer quadrant of right breast in female, estrogen receptor positive ([place]) 11/24/2019  . Sleep apnea      Social History   Socioeconomic History  . Marital status: Married    Spouse name: Not on file  . Number of children: 3  . Years of education: Not on file  . Highest education level: Not on file  Occupational History  . Occupation: Nurse P.  Tobacco Use  . Smoking status: Never Smoker  . Smokeless tobacco: Never Used  Vaping Use  . Vaping Use: Never used  Substance and Sexual Activity  . Alcohol use: Not Currently  . Drug use: Never  . Sexual activity: Yes     Partners: Male  Other Topics Concern  . Not on file  Social History Narrative  . Not on file   Social Determinants of Health   Financial Resource Strain: Not on file  Food Insecurity: Not on file  Transportation Needs: Not on file  Physical Activity: Not on file  Stress: Not on file  Social Connections: Not on file  Intimate Partner Violence: Not on file    Past Surgical History:  Procedure Laterality Date  . BREAST BIOPSY Right 2019  . BREAST LUMPECTOMY WITH RADIOACTIVE SEED AND SENTINEL LYMPH NODE BIOPSY Right 01/02/2020   Procedure: RIGHT BREAST LUMPECTOMY WITH RADIOACTIVE SEED AND RIGHT AXILLARY SENTINEL LYMPH NODE BIOPSY;  Surgeon: Rolm Bookbinder, MD;  Location: [facility];  Service: General;  Laterality: Right;  PEC BLOCK  . DILATION AND EVACUATION  07/14/2001   miscarriage  . ENDOMETRIAL ABLATION W/ NOVASURE      Family History  Problem Relation Age of Onset  . Breast cancer Paternal Aunt        dx after 41, triple negative  . Arthritis Mother   . GER disease Mother   . Arthritis Sister        psoriatic arthritis, colits (collagenous)  . Lung cancer Maternal Grandfather 75    No Known Allergies  No current outpatient medications on file prior to visit.   No current facility-administered medications on file prior to visit.    BP (!) 105/56 (BP Location: Left Arm, Patient Position: Sitting, Cuff Size: Small)  Pulse 72   Temp 97.9 F (36.6 C) (Oral)   Resp 16   Ht 5\' 8"  (1.727 m)   Wt 142 lb (64.4 kg)   SpO2 100%   BMI 21.59 kg/m        Objective:   Physical Exam  Physical Exam  Constitutional: She is oriented to person, place, and time. She appears well-developed and well-nourished. No distress.  HENT:  Head: Normocephalic and atraumatic.  Right Ear: Tympanic membrane and ear canal normal.  Left Ear: Tympanic membrane and ear canal normal.  Mouth/Throat: Not examined- pt wearing mask Eyes: Pupils are equal, round, and  reactive to light. No scleral icterus.  Neck: Normal range of motion. No thyromegaly present.  Cardiovascular: Normal rate and regular rhythm.   No murmur heard. Pulmonary/Chest: Effort normal and breath sounds normal. No respiratory distress. He has no wheezes. She has no rales. She exhibits no tenderness.  Abdominal: Soft. Bowel sounds are normal. She exhibits no distension and no mass. There is no tenderness. There is no rebound and no guarding.  Musculoskeletal: She exhibits no edema.  Lymphadenopathy:    She has no cervical adenopathy.  Neurological: She is alert and oriented to person, place, and time. She exhibits normal muscle tone. Coordination normal.  Skin: Skin is warm and dry.  Psychiatric: She has a normal mood and affect. Her behavior is normal. Judgment and thought content normal.  Breast/pelvic: deferred         Assessment & Plan:   Preventative care- immunizations reviewed and up to date. Will request copy of her most recent pap smear from GYN.  She is concerned that her hemoglobin has trended downward over the last few years. Follow up cbc is stable and iron studies are wnl.  We will have her complete an IFOB.  She understands that if IFOB is positive, then she will need to complete a colonoscopy rather than the cologuard.    This visit occurred during the SARS-CoV-2 public health emergency.  Safety protocols were in place, including screening questions prior to the visit, additional usage of staff PPE, and extensive cleaning of exam room while observing appropriate contact time as indicated for disinfecting solutions.         Assessment & Plan:

## 2020-01-27 ENCOUNTER — Telehealth: Payer: Self-pay | Admitting: *Deleted

## 2020-01-27 ENCOUNTER — Encounter: Payer: Self-pay | Admitting: *Deleted

## 2020-01-27 DIAGNOSIS — Z170 Estrogen receptor positive status [ER+]: Secondary | ICD-10-CM

## 2020-01-27 NOTE — Telephone Encounter (Signed)
Received Oncotype score of 16. Physician team notified. Called pt with results and discussed chemo not recommended and next step is xrt with Dr. Sondra Come. Received verbal understanding.  Referral placed for Dr. Sondra Come appt.

## 2020-01-28 DIAGNOSIS — T148XXA Other injury of unspecified body region, initial encounter: Secondary | ICD-10-CM | POA: Diagnosis not present

## 2020-01-28 DIAGNOSIS — D225 Melanocytic nevi of trunk: Secondary | ICD-10-CM | POA: Diagnosis not present

## 2020-01-28 DIAGNOSIS — L578 Other skin changes due to chronic exposure to nonionizing radiation: Secondary | ICD-10-CM | POA: Diagnosis not present

## 2020-01-28 DIAGNOSIS — D2272 Melanocytic nevi of left lower limb, including hip: Secondary | ICD-10-CM | POA: Diagnosis not present

## 2020-01-28 DIAGNOSIS — L814 Other melanin hyperpigmentation: Secondary | ICD-10-CM | POA: Diagnosis not present

## 2020-01-28 DIAGNOSIS — L821 Other seborrheic keratosis: Secondary | ICD-10-CM | POA: Diagnosis not present

## 2020-01-28 DIAGNOSIS — D239 Other benign neoplasm of skin, unspecified: Secondary | ICD-10-CM | POA: Diagnosis not present

## 2020-01-29 ENCOUNTER — Ambulatory Visit
Admission: RE | Admit: 2020-01-29 | Discharge: 2020-01-29 | Disposition: A | Discharge status: Home / Self Care | Payer: 59 | Source: Ambulatory Visit

## 2020-01-29 ENCOUNTER — Ambulatory Visit
Admission: RE | Admit: 2020-01-29 | Discharge: 2020-01-29 | Disposition: A | Discharge status: Home / Self Care | Payer: 59 | Source: Ambulatory Visit | Attending: Radiation Oncology | Admitting: Radiation Oncology

## 2020-01-29 ENCOUNTER — Other Ambulatory Visit: Payer: Self-pay

## 2020-01-29 ENCOUNTER — Encounter: Payer: Self-pay | Admitting: Radiation Oncology

## 2020-01-29 DIAGNOSIS — Z170 Estrogen receptor positive status [ER+]: Secondary | ICD-10-CM | POA: Insufficient documentation

## 2020-01-29 DIAGNOSIS — Z510 Encounter for antineoplastic radiation therapy: Secondary | ICD-10-CM | POA: Diagnosis not present

## 2020-01-29 DIAGNOSIS — C50411 Malignant neoplasm of upper-outer quadrant of right female breast: Secondary | ICD-10-CM | POA: Insufficient documentation

## 2020-01-29 NOTE — Progress Notes (Signed)
Patient here for a Reconsult with Dr. Sondra Come and for [place].  Location of Breast Cancer: right breast cancer  Biopsy on10/07/2021showed grade 1 invasive ductal carcinoma with ductal carcinoma in situ.   11/11/19 unilateral mammogram and right breast ultrasound.  Past/Anticipated interventions by surgeon: right lumpectomy 01/02/2020  Past/Anticipated interventions by medical oncology, if any:   Chemotherapy: Tamoxifen  Lymphedema issues, if any: no   Pain issues, if any:  no  SAFETY ISSUES:  Prior radiation?  no  Pacemaker/ICD? no  Possible current pregnancy? No, having periods  Is the patient on methotrexate? No   BP (!) 114/49   Temp (!) 97.5 F (36.4 C) (Temporal)   Resp 18   Ht 5\' 8"  (1.727 m)   Wt 142 lb 12.8 oz (64.8 kg)   SpO2 100%   BMI 21.71 kg/m    Wt Readings from Last 3 Encounters:  01/29/20 142 lb 12.8 oz (64.8 kg)  01/26/20 142 lb (64.4 kg)  01/02/20 140 lb 3.4 oz (63.6 kg)

## 2020-01-29 NOTE — Progress Notes (Signed)
Radiation Oncology         [phone] ________________________________  Name: Amber [name] MRN: [id number]  Date: 01/29/2020  DOB: 1970-09-29  Re-Evaluation Note  CC: Amber Alar, NP  Magrinat, Virgie Dad, MD    ICD-10-CM   1. Malignant neoplasm of upper-outer quadrant of right breast in female, estrogen receptor positive ([place])  C50.411    Z17.0     Diagnosis: Clinical Stage I (pT1b, pN0) Right Breast UOQ, Invasive Ductal Carcinoma with DCIS, ER/PR/Her2 pending, Grade 1  Narrative:  Amber [name] returns today to discuss radiation treatment options. She was seen in consultation on 11/24/2019. At that time, prognostic indicators were pending but she appeared to be an excellent candidate for breast conserving surgery with radiation therapy as a component of that treatment. She was scheduled to meet with medical oncology and surgery.  Since consultation, she met with Dr. Jana Hakim on 11/26/2019, who recommended genetic testing, surgery, oncotype, adjuvant radiation, and antiestrogens. Genetic testing was negative with a variant of uncertain significance in ATM.  MRI of bilateral breasts on 11/27/2019 showed a 5 x 5 mm area of non-mass enhancement in Amber posterior aspect of Amber upper outer quadrant of Amber right breat at Amber location of Amber biopsy marker clip artifact, compatible with a small area of residual DCIS or additional invasive malignancy at that location. There was also a 5 x 4 mm area of focal non-mass enhancement in Amber posterior aspect of Amber lower outer quadrant of Amber right breast, which had indeterminate features and could have possibly represented focal fibrocystic change or an additional small area of DCIS. Finally, there was a 3.6 x 1.8 x 0.5 cm area of patchy non-mass enhancement in Amber lower outer quadrant of Amber left breast that also had indeterminate features and could have possibly represented fibrocystic changes or DCIS. There was no adenopathy.  Biopsy of Amber  left lower outer quadrant on 12/08/2019 showed benign breast tissue with no evidence of malignancy.  Amber [name] underwent a right breast lumpectomy with right deep axillary sentinel lymph node biopsy on 01/02/2020 under Amber care of Dr. Donne Hazel. Pathology from Amber procedure revealed grade 1 invasive ductal carcinoma with intermediate grade ductal carcinoma in-situ. Margins were uninvolved by carcinoma; 0.5 cm from posterior and lateral margins. Right medial margin excision showed benign breast tissue with fibroadenomatoid and fibrocystic changes; no residual carcinoma. Right superior margin excision showed benign breast tissue; no residual carcinoma. No carcinoma was identified in five right axillary sentinel lymph nodes.  On review of systems, Amber [name] reports feeling well. She denies right breast pain problems with swelling in her right arm or hand.  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  Meds: No current outpatient medications on file.   No current facility-administered medications for this encounter.    Physical Findings: Amber [name] is in no acute distress. [name] is alert and oriented.  height is '5\' 8"'  (1.727 m) and weight is 142 lb 12.8 oz (64.8 kg). Her temporal temperature is 97.5 F (36.4 C) (abnormal). Her blood pressure is 114/49 (abnormal). Her respiration is 18 and oxygen saturation is 100%.  No significant changes. Lungs are clear to auscultation bilaterally. Heart has regular rate and rhythm. No palpable cervical, supraclavicular, or axillary adenopathy. Abdomen soft, non-tender, normal bowel sounds. Left breast: no palpable mass, nipple discharge or bleeding. Right breast: Well-healed scar in Amber upper outer quadrant from her lumpectomy.  A second scar is noted in Amber  axillary region from her sentinel node procedure.  No signs of infection within Amber breast.  Lab Findings: Lab Results  Component Value Date   WBC 5.1  01/26/2020   HGB 12.6 01/26/2020   HCT 38.3 01/26/2020   MCV 88.6 01/26/2020   PLT 222.0 01/26/2020    Radiographic Findings: NM Sentinel Node Inj-No Rpt (Breast)  Result Date: 01/02/2020 Sulfur colloid was injected by Amber nuclear medicine technologist for melanoma sentinel node.   MM Breast Surgical Specimen  Result Date: 01/02/2020 CLINICAL DATA:  Status post excisional biopsy of Amber right breast EXAM: SPECIMEN RADIOGRAPH OF Amber RIGHT BREAST COMPARISON:  Previous exam(s). FINDINGS: Status post excision of Amber right breast. Amber radioactive seed and biopsy marker clips are present, completely intact, and were marked for pathology. IMPRESSION: Specimen radiograph of Amber right breast. Electronically Signed   By: Lillia Mountain M.D.   On: 01/02/2020 13:07   [place] RT RADIOACTIVE SEED LOC  Result Date: 01/01/2020 CLINICAL DATA:  49 year old female presenting for radioactive seed localization of Amber right breast prior to lumpectomy. EXAM: ULTRASOUND GUIDED RADIOACTIVE SEED LOCALIZATION OF Amber RIGHT BREAST COMPARISON:  Previous exam(s). FINDINGS: [name] presents for radioactive seed localization prior to right breast lumpectomy. I met with Amber [name] and we discussed Amber procedure of seed localization including benefits and alternatives. We discussed Amber high likelihood of a successful procedure. We discussed Amber risks of Amber procedure including infection, bleeding, tissue injury and further surgery. We discussed Amber low dose of radioactivity involved in Amber procedure. Informed, written consent was given. Amber usual time-out protocol was performed immediately prior to Amber procedure. Using ultrasound guidance, sterile technique, 1% lidocaine and an I-125 radioactive seed, Amber mass in Amber right breast at 10 was localized using an inferior approach. Amber follow-up mammogram images confirm Amber seed in Amber expected location and were marked for Dr. Donne Hazel. Follow-up survey of Amber [name] confirms presence of  Amber radioactive seed. Order number of I-125 seed:  [id number]. Total activity:  5.176 millicuries reference Date: 12/24/2019 Amber [name] tolerated Amber procedure well and was released from [facility]. She was given instructions regarding seed removal. IMPRESSION: Radioactive seed localization right breast. No apparent complications. Electronically Signed   By: Ammie Ferrier M.D.   On: 01/01/2020 14:55   MM CLIP PLACEMENT RIGHT  Result Date: 01/01/2020 CLINICAL DATA:  Mammogram status post radioactive seed placement in Amber right breast EXAM: DIAGNOSTIC RIGHT MAMMOGRAM POST ULTRASOUND-GUIDED RADIOACTIVE SEED PLACEMENT COMPARISON:  Previous exam(s). FINDINGS: Mammographic images were obtained following ultrasound-guided radioactive seed placement. These demonstrate that Amber radioactive seed is well positioned at Amber site of Amber mass with Amber 2 biopsy marking clips in Amber upper-outer posterior right breast. IMPRESSION: Appropriate location of Amber radioactive seed in Amber upper-outer right breast. Final Assessment: Post Procedure Mammograms for Seed Placement Electronically Signed   By: Ammie Ferrier M.D.   On: 01/01/2020 14:57    Impression: Clinical Stage I (pT1b, pN0) Right Breast UOQ, Invasive Ductal Carcinoma with DCIS, ER/PR/Her2 pending, Grade 1  Amber [name] would be an excellent candidate for breast conservation therapy with radiation therapy as a component.  She would also be a good candidate for hypofractionated accelerated radiation therapy over approximately 4 weeks.  Amber [name] does wish to proceed with this accelerated course of treatment.  I discussed Amber overall treatment course side effects and potential toxicities of radiation therapy in this situation.  She appears to understand and wishes to proceed with planned course of treatment.  Plan:  [name] is scheduled for CT simulation later today.  She will receive 4 weeks of radiation therapy directed at Amber right breast.   Treatments to start December 27 or 28.  Total time spent in this encounter was 35 minutes which included reviewing Amber [name]'s most recent consultations, biopsies, lumpectomy, pathology reports, MRI of bilateral breasts, physical examination, and documentation.  -----------------------------------  Blair Promise, PhD, MD  This document serves as a record of services personally performed by Gery Pray, MD. It was created on his behalf by Clerance Lav, a trained medical scribe. Amber creation of this record is based on Amber scribe's personal observations and Amber provider's statements to them. This document has been checked and approved by Amber attending provider.

## 2020-01-29 NOTE — Progress Notes (Signed)
Error

## 2020-02-02 ENCOUNTER — Other Ambulatory Visit: Payer: Self-pay | Admitting: Primary Care

## 2020-02-02 ENCOUNTER — Encounter: Payer: Self-pay | Admitting: *Deleted

## 2020-02-02 ENCOUNTER — Encounter: Payer: Self-pay | Admitting: Primary Care

## 2020-02-02 ENCOUNTER — Ambulatory Visit (INDEPENDENT_AMBULATORY_CARE_PROVIDER_SITE_OTHER): Payer: 59 | Admitting: Primary Care

## 2020-02-02 VITALS — BP 114/63 | HR 79 | Ht 68.0 in | Wt 141.0 lb

## 2020-02-02 DIAGNOSIS — G4733 Obstructive sleep apnea (adult) (pediatric): Secondary | ICD-10-CM

## 2020-02-02 DIAGNOSIS — G4700 Insomnia, unspecified: Secondary | ICD-10-CM | POA: Insufficient documentation

## 2020-02-02 MED ORDER — ZOLPIDEM TARTRATE 5 MG PO TABS: 5.0000 mg | ORAL_TABLET | Freq: Every evening | ORAL | 1 refills | Status: DC | PRN

## 2020-02-02 MED FILL — ZOLPIDEM TARTRATE 5 MG TABS: 5 | 30 days supply | Qty: 30 | Fill #0

## 2020-02-02 NOTE — Assessment & Plan Note (Signed)
-   D/t psychological stress  - Trial Ambien 5mg  QHS prn insomnia - Patient aware not to combine with alcohol/other sedating medication and encouarage continued compliance with CPAP

## 2020-02-02 NOTE — Assessment & Plan Note (Signed)
-   HST September 2019 showed moderate OSA, AHI 17.2 with Spo2 low 80% - She is 62% compliant with CPAP > 4 hours.  - She has some trouble falling asleep with CPAP mask. No sig daytime fatigue. - Pressure 4-8cm h20; residual AHI 0.2 - No changes today. We will send in an order to renew CPAP supplies with [employer].  - Encourage patient continue to wear CPAP everynight for min 4-6 hours. Advised not drive if experiencing excessive daytime fatigue or somnolence. - FU annually or prn worsening symptoms

## 2020-02-02 NOTE — Progress Notes (Signed)
@Patient  ID: Amber [name], female    DOB: 10-12-1970, 49 y.o.   MRN: [id number]  No chief complaint on file.   Referring provider: Debbrah Alar, NP  HPI: 49 year old female, never smoked. PMH significant for OSA, malignant neoplasm right breast. Patient of Dr. Annamaria Boots, last seen by him on 11/13/17. HST on 11/12/17 showed moderate OSA, AHI 17.2 with SpO2 low 80%. Maintained on AUTO CPAP at 4-8cm h20.  02/02/2020 Patient presents today for follow-up OSA on CPAP. 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 Donne Hazel which was negative for lymph involvement and she had clear margins. She is awaiting oncotype testing to determine if she will need to undergo chemotherapy. She saw Dr. Sondra Come with rad/onc on 01/29/20, planning for 4 weeks radiation treatment to start December 27th or 28th. She underwent CT simulation on 01/29/20. Hgb has been trending down, follow up CBC is stable and iron studies wnl. PCP ordered IFOB, if positive will need colonoscopy.   She is moderately compliant with CPAP. No issues with mask fit or pressure setting. Current pressure setting 4-8cm h20. She is wearing nasal mask. 90 day download showed 62% compliance > 4 hours. She often has trouble falling asleep at night d/t her mind racing. Bedtime is typically around midnight. She gets on average 5 hours of sleep at night. She does not have significant residual daytime fatigue. Drinks 2 cups of coffee during the day. DME company is [employer], she owns her CPAP machine.   Airview download 11/04/19- 02/01/20: Usage 67/90 days (74%); 56 days (62%) > 4 hours Average usage days used 5 hours 8 mins Pressure 4-8cm h20 (6.5cm h20 - 95%) Airleaks 0.1L/min AHI 0.2  No Known Allergies  Immunization History  Administered Date(s) Administered  . Influenza,inj,Quad PF,6+ Mos 11/28/2019  . PFIZER SARS-COV-2 Vaccination 02/10/2019,  03/03/2019, 11/07/2019  . Tdap 11/09/2015    Past Medical History:  Diagnosis Date  . Deviated septum   . Malignant neoplasm of upper-outer quadrant of right breast in female, estrogen receptor positive ([place]) 11/24/2019  . Sleep apnea     Tobacco History: Social History   Tobacco Use  Smoking Status Never Smoker  Smokeless Tobacco Never Used   Counseling given: Not Answered   No outpatient medications prior to visit.   No facility-administered medications prior to visit.    Review of Systems  Review of Systems  Constitutional: Negative.   Psychiatric/Behavioral: Positive for sleep disturbance.   Physical Exam  BP 114/63 (BP Location: Left Arm, Patient Position: Sitting, Cuff Size: Normal)   Pulse 79   Ht 5\' 8"  (1.727 m)   Wt 141 lb (64 kg)   SpO2 100%   BMI 21.44 kg/m  Physical Exam Constitutional:      Appearance: Normal appearance.  HENT:     Head: Normocephalic and atraumatic.     Mouth/Throat:     Comments: Deferred d/t masking Cardiovascular:     Rate and Rhythm: Normal rate.  Pulmonary:     Effort: Pulmonary effort is normal.  Musculoskeletal:        General: Normal range of motion.  Neurological:     General: No focal deficit present.     Mental Status: She is alert and oriented to person, place, and time. Mental status is at baseline.  Psychiatric:        Mood and Affect: Mood normal.  Behavior: Behavior normal.        Thought Content: Thought content normal.        Judgment: Judgment normal.      Lab Results:  CBC    Component Value Date/Time   WBC 5.1 01/26/2020 1320   RBC 4.32 01/26/2020 1320   HGB 12.6 01/26/2020 1320   HGB 12.1 11/26/2019 1547   HCT 38.3 01/26/2020 1320   PLT 222.0 01/26/2020 1320   PLT 206 11/26/2019 1547   MCV 88.6 01/26/2020 1320   MCH 29.7 11/26/2019 1547   MCHC 33.0 01/26/2020 1320   RDW 13.5 01/26/2020 1320   LYMPHSABS 1.1 01/26/2020 1320   MONOABS 0.6 01/26/2020 1320   EOSABS 0.1 01/26/2020  1320   BASOSABS 0.0 01/26/2020 1320    BMET    Component Value Date/Time   NA 140 11/26/2019 1547   K 3.8 11/26/2019 1547   CL 106 11/26/2019 1547   CO2 29 11/26/2019 1547   GLUCOSE 91 11/26/2019 1547   BUN 11 11/26/2019 1547   CREATININE 0.79 11/26/2019 1547   CREATININE 0.84 11/14/2018 0944   CALCIUM 9.3 11/26/2019 1547   GFRNONAA >60 11/26/2019 1547   GFRNONAA 83 11/14/2018 0944   GFRAA 96 11/14/2018 0944    BNP No results found for: BNP  ProBNP No results found for: PROBNP  Imaging: No results found.   Assessment & Plan:   Obstructive sleep apnea - HST September 2019 showed moderate OSA, AHI 17.2 with Spo2 low 80% - She is 62% compliant with CPAP > 4 hours.  - She has some trouble falling asleep with CPAP mask. No sig daytime fatigue. - Pressure 4-8cm h20; residual AHI 0.2 - No changes today. We will send in an order to renew CPAP supplies with [employer].  - Encourage patient continue to wear CPAP everynight for min 4-6 hours. Advised not drive if experiencing excessive daytime fatigue or somnolence. - FU annually or prn worsening symptoms   Insomnia - D/t psychological stress  - Trial Ambien 5mg  QHS prn insomnia - Patient aware not to combine with alcohol/other sedating medication and encouarage continued compliance with CPAP   Martyn Ehrich, NP 02/02/2020

## 2020-02-03 ENCOUNTER — Encounter: Payer: Self-pay | Admitting: Oncology

## 2020-02-04 ENCOUNTER — Ambulatory Visit: Payer: 59 | Admitting: Physical Therapy

## 2020-02-05 DIAGNOSIS — Z510 Encounter for antineoplastic radiation therapy: Secondary | ICD-10-CM | POA: Diagnosis not present

## 2020-02-05 DIAGNOSIS — Z170 Estrogen receptor positive status [ER+]: Secondary | ICD-10-CM | POA: Diagnosis not present

## 2020-02-05 DIAGNOSIS — C50411 Malignant neoplasm of upper-outer quadrant of right female breast: Secondary | ICD-10-CM | POA: Diagnosis not present

## 2020-02-09 ENCOUNTER — Telehealth: Payer: Self-pay | Admitting: Adult Health

## 2020-02-09 ENCOUNTER — Ambulatory Visit
Admission: RE | Admit: 2020-02-09 | Discharge: 2020-02-09 | Disposition: A | Discharge status: Home / Self Care | Payer: 59 | Source: Ambulatory Visit | Attending: Radiation Oncology | Admitting: Radiation Oncology

## 2020-02-09 DIAGNOSIS — C50411 Malignant neoplasm of upper-outer quadrant of right female breast: Secondary | ICD-10-CM

## 2020-02-09 DIAGNOSIS — Z510 Encounter for antineoplastic radiation therapy: Secondary | ICD-10-CM | POA: Diagnosis not present

## 2020-02-09 DIAGNOSIS — Z170 Estrogen receptor positive status [ER+]: Secondary | ICD-10-CM | POA: Diagnosis not present

## 2020-02-09 NOTE — Telephone Encounter (Signed)
Left message to schedule SCP visit w/Lindsey Causey per 12/27 schedule message.

## 2020-02-10 ENCOUNTER — Ambulatory Visit
Admission: RE | Admit: 2020-02-10 | Discharge: 2020-02-10 | Disposition: A | Discharge status: Home / Self Care | Payer: 59 | Source: Ambulatory Visit | Attending: Radiation Oncology | Admitting: Radiation Oncology

## 2020-02-10 DIAGNOSIS — C50411 Malignant neoplasm of upper-outer quadrant of right female breast: Secondary | ICD-10-CM

## 2020-02-10 DIAGNOSIS — Z510 Encounter for antineoplastic radiation therapy: Secondary | ICD-10-CM | POA: Diagnosis not present

## 2020-02-10 DIAGNOSIS — Z170 Estrogen receptor positive status [ER+]: Secondary | ICD-10-CM | POA: Diagnosis not present

## 2020-02-10 MED ORDER — RADIAPLEXRX EX GEL: Freq: Once | CUTANEOUS | Status: AC

## 2020-02-10 MED ORDER — ALRA NON-METALLIC DEODORANT (RAD-ONC)
1.0000 "application " | Freq: Once | TOPICAL | Status: AC
  Administered 2020-02-10: 1 via TOPICAL

## 2020-02-10 NOTE — Progress Notes (Signed)
Patient given radiation handbook, [facility] and [facility] for use during Radiation tx. Common side effects of radiation reviewed. Patient verbalized understanding of the above.

## 2020-02-11 ENCOUNTER — Other Ambulatory Visit: Payer: Self-pay

## 2020-02-11 ENCOUNTER — Ambulatory Visit
Admission: RE | Admit: 2020-02-11 | Discharge: 2020-02-11 | Disposition: A | Discharge status: Home / Self Care | Payer: 59 | Source: Ambulatory Visit | Attending: Radiation Oncology | Admitting: Radiation Oncology

## 2020-02-11 DIAGNOSIS — C50411 Malignant neoplasm of upper-outer quadrant of right female breast: Secondary | ICD-10-CM | POA: Diagnosis not present

## 2020-02-11 DIAGNOSIS — Z170 Estrogen receptor positive status [ER+]: Secondary | ICD-10-CM | POA: Diagnosis not present

## 2020-02-11 DIAGNOSIS — Z510 Encounter for antineoplastic radiation therapy: Secondary | ICD-10-CM | POA: Diagnosis not present

## 2020-02-12 ENCOUNTER — Encounter: Payer: Self-pay | Admitting: Rehabilitation

## 2020-02-12 ENCOUNTER — Ambulatory Visit: Payer: 59

## 2020-02-12 ENCOUNTER — Ambulatory Visit: Payer: 59 | Attending: Oncology | Admitting: Rehabilitation

## 2020-02-12 DIAGNOSIS — Z483 Aftercare following surgery for neoplasm: Secondary | ICD-10-CM | POA: Insufficient documentation

## 2020-02-12 NOTE — Therapy (Signed)
[facility] [address] [place], [place], [address] Phone: [phone]   Fax:  [phone]  Physical Therapy Treatment  Patient Details  Name: Amber [name] MRN: [id number] Date of Birth: 1970/05/29 Referring Provider (PT): Riki Altes Date: 02/12/2020   PT End of Session - 02/12/20 8280    Visit Number 2    Number of Visits 2    Date for PT Re-Evaluation 01/27/20    PT Start Time 0807    PT Stop Time 0835    PT Time Calculation (min) 28 min    Activity Tolerance Patient tolerated treatment well    Behavior During Therapy [facility] for tasks assessed/performed           Past Medical History:  Diagnosis Date  . Deviated septum   . Malignant neoplasm of upper-outer quadrant of right breast in female, estrogen receptor positive (HCC) 11/24/2019  . Sleep apnea     Past Surgical History:  Procedure Laterality Date  . BREAST BIOPSY Right 2019  . BREAST LUMPECTOMY WITH RADIOACTIVE SEED AND SENTINEL LYMPH NODE BIOPSY Right 01/02/2020   Procedure: RIGHT BREAST LUMPECTOMY WITH RADIOACTIVE SEED AND RIGHT AXILLARY SENTINEL LYMPH NODE BIOPSY;  Surgeon: Emelia Loron, MD;  Location: [facility];  Service: General;  Laterality: Right;  PEC BLOCK  . DILATION AND EVACUATION  07/14/2001   miscarriage  . ENDOMETRIAL ABLATION W/ NOVASURE      There were no vitals filed for this visit.   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 Dwain Sarna.  Started radiation this week.    Currently in Pain? No/denies              [facility] PT Assessment - 02/12/20 0001      Observation/Other Assessments   Observations one short thicker cord  in the Rt axilla without movement limitation    Skin Integrity 2 recently healed incisions with some increased scar tissue under both      AROM   Right Shoulder Flexion 167 Degrees    Right Shoulder ABduction 175 Degrees    Right Shoulder Internal Rotation 71 Degrees    Right Shoulder External Rotation 85 Degrees             LYMPHEDEMA/ONCOLOGY QUESTIONNAIRE - 02/12/20 0001      Type   Cancer Type right breast cancer      Surgeries   Lumpectomy Date 01/02/20    Number Lymph Nodes Removed 5      Treatment   Active Chemotherapy Treatment No    Active Radiation Treatment Yes                      [facility] Adult PT Treatment/Exercise - 02/12/20 0001      Self-Care   Self-Care Other Self-Care Comments    Other Self-Care Comments  Educated pt on risk reduction with handout given.  Scheduled pt for SOZO screening at 3 months.  Updated HEP to include doorway single arm stretch and mermaid stretch and eduction on returning to PT if the cording gets any worse or starts to restrict movement.  Discussion about exercise progression, use of compression sleeves, and if needing to  do hot tub at home starting slow and testing tolerance and stopping if needed.  Education on self scar massage.                  PT Education - 02/12/20 0837    Education Details see treatment section    Person(s) Educated Patient    Methods Explanation;Handout    Comprehension Verbalized understanding               PT Long Term Goals - 02/12/20 0840      PT LONG TERM GOAL #1   Title Pt will return to baseline ROM measurements for shoulder to allow pt to return to PLOF.    Status Achieved                 Plan - 02/12/20 [id number]    Clinical Impression Statement Pt presents 6 weeks post lumpectomy 1 week into radiation with return to full AROM, activities, home exercising, and ADLs.  Pt does have one short but thick cord in the axilla and was educated on scar massage, new  stretches, and to return to PT if this worsens at all or starts limiting motion.  Pt demonstrates no breast edema but does have some increased scar tissue deep to each incision so pt was educated on self scar massage.  Pt was scheduled for SOZO repeat in around 3 months.    PT Next Visit Plan SOZO surveillance, PT as needed    PT Home Exercise Plan post op breast exercises, single arm doorway, mermaid stretch    Recommended Other Services possible sleeve for exercise, pt wanting to wait    Consulted and Agree with Plan of Care Patient           Patient will benefit from skilled therapeutic intervention in order to improve the following deficits and impairments:     Visit Diagnosis: Aftercare following surgery for neoplasm     Problem List Patient Active Problem List   Diagnosis Date Noted  . Insomnia 02/02/2020  . Genetic testing 12/08/2019  . Malignant neoplasm of upper-outer quadrant of right breast in female, estrogen receptor positive ([place]) 11/24/2019  . Abdominal pain 06/17/2018  . Flu-like symptoms 06/04/2018  . Obstructive sleep apnea 11/13/2017  . Extensor tendinitis of foot 07/31/2013    Stark Bray 02/12/2020, 8:41 AM  [place], [place], [address] Phone: [phone]   Fax:  [phone]  Name: Amber [name] MRN: [id number] Date of Birth: Jul 26, 1970

## 2020-02-12 NOTE — Patient Instructions (Addendum)
Scar Massage  Scar massage is done to improve the mobility of scar, decrease scar tissue from building up, reduce adhesions, and prevent Keloids from forming. Start scar massage after scabs have fallen off by themselves and no open areas. The first few weeks after surgery, it is normal for a scar to appear pink or red and slightly raised. Scars can itch or have areas of numbness. Some scars may be sensitive.   Direct Scar massage: after scar is healed, no opening, no scab 1.  Place pads of two fingers together directly on the scar starting at one end of the scar. Move the fingers up and down across the scar holding 5 seconds one direction.  Then go opposite direction hold 5 seconds.  2. Move over to the next section of the scar and repeat.  Work your way along the entire length of the scar.   3. Next make diagonal movements along the scar holding 5 seconds at one direction. 4. Next movement is side to side. 5. Do not rub fingers over the scar.  Instead keep firm pressure and move scar over the tissue it is on top   Scar Lift and Roll 12 weeks after surgery. 1. Pinch a small amount of the scar between your first two fingers and thumb.  2. Roll the scar between your fingers for 5 to 15 seconds. 3. Move along the scar and repeat until you have massaged the entire length of scar.   Stop the massage and call your doctor if you notice: 1. Increased redness 2. Bleeding from scar 3. Seepage coming from the scar 4. Scar is warmer and has increased pain    Access Code: [email or web address]: [email or web address]: 12/30/2021Prepared by: Diannia Ruder TevisExercises  Mermaid - 1 x daily - 7 x weekly - 1-3 sets - 10 reps - 20-30 seconds hold  Single Arm Doorway Pec Stretch at 120 Degrees Abduction - 1 x daily - 7 x weekly - 1-3 sets - 10 reps - 20-30 seconds hold

## 2020-02-14 DIAGNOSIS — Z923 Personal history of irradiation: Secondary | ICD-10-CM

## 2020-02-14 HISTORY — DX: Personal history of irradiation: Z92.3

## 2020-02-16 ENCOUNTER — Other Ambulatory Visit: Payer: Self-pay

## 2020-02-16 ENCOUNTER — Ambulatory Visit
Admission: RE | Admit: 2020-02-16 | Discharge: 2020-02-16 | Disposition: A | Discharge status: Home / Self Care | Payer: 59 | Source: Ambulatory Visit | Attending: Radiation Oncology | Admitting: Radiation Oncology

## 2020-02-16 DIAGNOSIS — Z510 Encounter for antineoplastic radiation therapy: Secondary | ICD-10-CM | POA: Insufficient documentation

## 2020-02-16 DIAGNOSIS — Z170 Estrogen receptor positive status [ER+]: Secondary | ICD-10-CM | POA: Insufficient documentation

## 2020-02-16 DIAGNOSIS — C50411 Malignant neoplasm of upper-outer quadrant of right female breast: Secondary | ICD-10-CM | POA: Insufficient documentation

## 2020-02-16 DIAGNOSIS — Z79810 Long term (current) use of selective estrogen receptor modulators (SERMs): Secondary | ICD-10-CM | POA: Diagnosis not present

## 2020-02-16 DIAGNOSIS — Z801 Family history of malignant neoplasm of trachea, bronchus and lung: Secondary | ICD-10-CM | POA: Diagnosis not present

## 2020-02-16 DIAGNOSIS — Z923 Personal history of irradiation: Secondary | ICD-10-CM | POA: Diagnosis not present

## 2020-02-16 DIAGNOSIS — G4730 Sleep apnea, unspecified: Secondary | ICD-10-CM | POA: Diagnosis not present

## 2020-02-16 DIAGNOSIS — Z803 Family history of malignant neoplasm of breast: Secondary | ICD-10-CM | POA: Diagnosis not present

## 2020-02-17 ENCOUNTER — Other Ambulatory Visit: Payer: Self-pay

## 2020-02-17 ENCOUNTER — Ambulatory Visit
Admission: RE | Admit: 2020-02-17 | Discharge: 2020-02-17 | Disposition: A | Discharge status: Home / Self Care | Payer: 59 | Source: Ambulatory Visit | Attending: Radiation Oncology | Admitting: Radiation Oncology

## 2020-02-17 DIAGNOSIS — Z923 Personal history of irradiation: Secondary | ICD-10-CM | POA: Diagnosis not present

## 2020-02-17 DIAGNOSIS — Z510 Encounter for antineoplastic radiation therapy: Secondary | ICD-10-CM | POA: Diagnosis not present

## 2020-02-17 DIAGNOSIS — Z170 Estrogen receptor positive status [ER+]: Secondary | ICD-10-CM | POA: Diagnosis not present

## 2020-02-17 DIAGNOSIS — Z803 Family history of malignant neoplasm of breast: Secondary | ICD-10-CM | POA: Diagnosis not present

## 2020-02-17 DIAGNOSIS — C50411 Malignant neoplasm of upper-outer quadrant of right female breast: Secondary | ICD-10-CM | POA: Diagnosis not present

## 2020-02-17 DIAGNOSIS — Z79810 Long term (current) use of selective estrogen receptor modulators (SERMs): Secondary | ICD-10-CM | POA: Diagnosis not present

## 2020-02-17 DIAGNOSIS — G4730 Sleep apnea, unspecified: Secondary | ICD-10-CM | POA: Diagnosis not present

## 2020-02-17 DIAGNOSIS — Z801 Family history of malignant neoplasm of trachea, bronchus and lung: Secondary | ICD-10-CM | POA: Diagnosis not present

## 2020-02-17 MED FILL — ZOLPIDEM TARTRATE 5 MG TABS: 5 | 30 days supply | Qty: 30 | Fill #0

## 2020-02-18 ENCOUNTER — Telehealth: Payer: Self-pay | Admitting: Family

## 2020-02-18 ENCOUNTER — Ambulatory Visit
Admission: RE | Admit: 2020-02-18 | Discharge: 2020-02-18 | Disposition: A | Discharge status: Home / Self Care | Payer: 59 | Source: Ambulatory Visit | Attending: Radiation Oncology | Admitting: Radiation Oncology

## 2020-02-18 ENCOUNTER — Other Ambulatory Visit: Payer: Self-pay

## 2020-02-18 DIAGNOSIS — Z923 Personal history of irradiation: Secondary | ICD-10-CM | POA: Diagnosis not present

## 2020-02-18 DIAGNOSIS — Z803 Family history of malignant neoplasm of breast: Secondary | ICD-10-CM | POA: Diagnosis not present

## 2020-02-18 DIAGNOSIS — C50411 Malignant neoplasm of upper-outer quadrant of right female breast: Secondary | ICD-10-CM | POA: Diagnosis not present

## 2020-02-18 DIAGNOSIS — Z510 Encounter for antineoplastic radiation therapy: Secondary | ICD-10-CM | POA: Diagnosis not present

## 2020-02-18 DIAGNOSIS — Z170 Estrogen receptor positive status [ER+]: Secondary | ICD-10-CM | POA: Diagnosis not present

## 2020-02-18 DIAGNOSIS — G4730 Sleep apnea, unspecified: Secondary | ICD-10-CM | POA: Diagnosis not present

## 2020-02-18 DIAGNOSIS — Z801 Family history of malignant neoplasm of trachea, bronchus and lung: Secondary | ICD-10-CM | POA: Diagnosis not present

## 2020-02-18 DIAGNOSIS — Z79810 Long term (current) use of selective estrogen receptor modulators (SERMs): Secondary | ICD-10-CM | POA: Diagnosis not present

## 2020-02-18 NOTE — Telephone Encounter (Signed)
Caller: [employer]) Call back number: [phone]  They received cologuard order, however, is missing provider signature.

## 2020-02-19 ENCOUNTER — Ambulatory Visit
Admission: RE | Admit: 2020-02-19 | Discharge: 2020-02-19 | Disposition: A | Discharge status: Home / Self Care | Payer: 59 | Source: Ambulatory Visit | Attending: Radiation Oncology | Admitting: Radiation Oncology

## 2020-02-19 DIAGNOSIS — Z803 Family history of malignant neoplasm of breast: Secondary | ICD-10-CM | POA: Diagnosis not present

## 2020-02-19 DIAGNOSIS — Z801 Family history of malignant neoplasm of trachea, bronchus and lung: Secondary | ICD-10-CM | POA: Diagnosis not present

## 2020-02-19 DIAGNOSIS — G4730 Sleep apnea, unspecified: Secondary | ICD-10-CM | POA: Diagnosis not present

## 2020-02-19 DIAGNOSIS — C50411 Malignant neoplasm of upper-outer quadrant of right female breast: Secondary | ICD-10-CM | POA: Diagnosis not present

## 2020-02-19 DIAGNOSIS — Z510 Encounter for antineoplastic radiation therapy: Secondary | ICD-10-CM | POA: Diagnosis not present

## 2020-02-19 DIAGNOSIS — Z170 Estrogen receptor positive status [ER+]: Secondary | ICD-10-CM | POA: Diagnosis not present

## 2020-02-19 DIAGNOSIS — Z79810 Long term (current) use of selective estrogen receptor modulators (SERMs): Secondary | ICD-10-CM | POA: Diagnosis not present

## 2020-02-19 DIAGNOSIS — Z923 Personal history of irradiation: Secondary | ICD-10-CM | POA: Diagnosis not present

## 2020-02-19 NOTE — Telephone Encounter (Signed)
Second order faxed with signature as requested by [employer] sciences

## 2020-02-20 ENCOUNTER — Ambulatory Visit
Admission: RE | Admit: 2020-02-20 | Discharge: 2020-02-20 | Disposition: A | Discharge status: Home / Self Care | Payer: 59 | Source: Ambulatory Visit | Attending: Radiation Oncology | Admitting: Radiation Oncology

## 2020-02-20 ENCOUNTER — Other Ambulatory Visit: Payer: Self-pay

## 2020-02-20 DIAGNOSIS — G4730 Sleep apnea, unspecified: Secondary | ICD-10-CM | POA: Diagnosis not present

## 2020-02-20 DIAGNOSIS — Z510 Encounter for antineoplastic radiation therapy: Secondary | ICD-10-CM | POA: Diagnosis not present

## 2020-02-20 DIAGNOSIS — Z170 Estrogen receptor positive status [ER+]: Secondary | ICD-10-CM | POA: Diagnosis not present

## 2020-02-20 DIAGNOSIS — Z79810 Long term (current) use of selective estrogen receptor modulators (SERMs): Secondary | ICD-10-CM | POA: Diagnosis not present

## 2020-02-20 DIAGNOSIS — Z801 Family history of malignant neoplasm of trachea, bronchus and lung: Secondary | ICD-10-CM | POA: Diagnosis not present

## 2020-02-20 DIAGNOSIS — C50411 Malignant neoplasm of upper-outer quadrant of right female breast: Secondary | ICD-10-CM | POA: Diagnosis not present

## 2020-02-20 DIAGNOSIS — Z803 Family history of malignant neoplasm of breast: Secondary | ICD-10-CM | POA: Diagnosis not present

## 2020-02-20 DIAGNOSIS — Z923 Personal history of irradiation: Secondary | ICD-10-CM | POA: Diagnosis not present

## 2020-02-22 DIAGNOSIS — Z803 Family history of malignant neoplasm of breast: Secondary | ICD-10-CM | POA: Diagnosis not present

## 2020-02-22 DIAGNOSIS — Z923 Personal history of irradiation: Secondary | ICD-10-CM | POA: Diagnosis not present

## 2020-02-22 DIAGNOSIS — G4730 Sleep apnea, unspecified: Secondary | ICD-10-CM | POA: Diagnosis not present

## 2020-02-22 DIAGNOSIS — Z510 Encounter for antineoplastic radiation therapy: Secondary | ICD-10-CM | POA: Diagnosis not present

## 2020-02-22 DIAGNOSIS — Z79810 Long term (current) use of selective estrogen receptor modulators (SERMs): Secondary | ICD-10-CM | POA: Diagnosis not present

## 2020-02-22 DIAGNOSIS — Z801 Family history of malignant neoplasm of trachea, bronchus and lung: Secondary | ICD-10-CM | POA: Diagnosis not present

## 2020-02-22 DIAGNOSIS — Z170 Estrogen receptor positive status [ER+]: Secondary | ICD-10-CM | POA: Diagnosis not present

## 2020-02-22 DIAGNOSIS — C50411 Malignant neoplasm of upper-outer quadrant of right female breast: Secondary | ICD-10-CM | POA: Diagnosis not present

## 2020-02-23 ENCOUNTER — Ambulatory Visit
Admission: RE | Admit: 2020-02-23 | Discharge: 2020-02-23 | Disposition: A | Discharge status: Home / Self Care | Payer: 59 | Source: Ambulatory Visit | Attending: Radiation Oncology | Admitting: Radiation Oncology

## 2020-02-23 ENCOUNTER — Other Ambulatory Visit: Payer: Self-pay

## 2020-02-23 DIAGNOSIS — Z923 Personal history of irradiation: Secondary | ICD-10-CM | POA: Diagnosis not present

## 2020-02-23 DIAGNOSIS — G4730 Sleep apnea, unspecified: Secondary | ICD-10-CM | POA: Diagnosis not present

## 2020-02-23 DIAGNOSIS — C50411 Malignant neoplasm of upper-outer quadrant of right female breast: Secondary | ICD-10-CM | POA: Diagnosis not present

## 2020-02-23 DIAGNOSIS — Z170 Estrogen receptor positive status [ER+]: Secondary | ICD-10-CM | POA: Diagnosis not present

## 2020-02-23 DIAGNOSIS — Z79810 Long term (current) use of selective estrogen receptor modulators (SERMs): Secondary | ICD-10-CM | POA: Diagnosis not present

## 2020-02-23 DIAGNOSIS — Z801 Family history of malignant neoplasm of trachea, bronchus and lung: Secondary | ICD-10-CM | POA: Diagnosis not present

## 2020-02-23 DIAGNOSIS — Z510 Encounter for antineoplastic radiation therapy: Secondary | ICD-10-CM | POA: Diagnosis not present

## 2020-02-23 DIAGNOSIS — Z803 Family history of malignant neoplasm of breast: Secondary | ICD-10-CM | POA: Diagnosis not present

## 2020-02-24 ENCOUNTER — Ambulatory Visit: Payer: 59 | Admitting: Radiation Oncology

## 2020-02-24 ENCOUNTER — Ambulatory Visit
Admission: RE | Admit: 2020-02-24 | Discharge: 2020-02-24 | Disposition: A | Discharge status: Home / Self Care | Payer: 59 | Source: Ambulatory Visit | Attending: Radiation Oncology | Admitting: Radiation Oncology

## 2020-02-24 DIAGNOSIS — Z923 Personal history of irradiation: Secondary | ICD-10-CM | POA: Diagnosis not present

## 2020-02-24 DIAGNOSIS — C50411 Malignant neoplasm of upper-outer quadrant of right female breast: Secondary | ICD-10-CM | POA: Diagnosis not present

## 2020-02-24 DIAGNOSIS — Z79810 Long term (current) use of selective estrogen receptor modulators (SERMs): Secondary | ICD-10-CM | POA: Diagnosis not present

## 2020-02-24 DIAGNOSIS — Z170 Estrogen receptor positive status [ER+]: Secondary | ICD-10-CM | POA: Diagnosis not present

## 2020-02-24 DIAGNOSIS — Z803 Family history of malignant neoplasm of breast: Secondary | ICD-10-CM | POA: Diagnosis not present

## 2020-02-24 DIAGNOSIS — Z510 Encounter for antineoplastic radiation therapy: Secondary | ICD-10-CM | POA: Diagnosis not present

## 2020-02-24 DIAGNOSIS — Z801 Family history of malignant neoplasm of trachea, bronchus and lung: Secondary | ICD-10-CM | POA: Diagnosis not present

## 2020-02-24 DIAGNOSIS — G4730 Sleep apnea, unspecified: Secondary | ICD-10-CM | POA: Diagnosis not present

## 2020-02-25 ENCOUNTER — Ambulatory Visit
Admission: RE | Admit: 2020-02-25 | Discharge: 2020-02-25 | Disposition: A | Discharge status: Home / Self Care | Payer: 59 | Source: Ambulatory Visit | Attending: Radiation Oncology | Admitting: Radiation Oncology

## 2020-02-25 ENCOUNTER — Other Ambulatory Visit: Payer: Self-pay

## 2020-02-25 ENCOUNTER — Ambulatory Visit: Payer: 59 | Admitting: Radiation Oncology

## 2020-02-25 DIAGNOSIS — Z79810 Long term (current) use of selective estrogen receptor modulators (SERMs): Secondary | ICD-10-CM | POA: Diagnosis not present

## 2020-02-25 DIAGNOSIS — C50411 Malignant neoplasm of upper-outer quadrant of right female breast: Secondary | ICD-10-CM | POA: Diagnosis not present

## 2020-02-25 DIAGNOSIS — G4730 Sleep apnea, unspecified: Secondary | ICD-10-CM | POA: Diagnosis not present

## 2020-02-25 DIAGNOSIS — Z801 Family history of malignant neoplasm of trachea, bronchus and lung: Secondary | ICD-10-CM | POA: Diagnosis not present

## 2020-02-25 DIAGNOSIS — Z170 Estrogen receptor positive status [ER+]: Secondary | ICD-10-CM | POA: Diagnosis not present

## 2020-02-25 DIAGNOSIS — Z923 Personal history of irradiation: Secondary | ICD-10-CM | POA: Diagnosis not present

## 2020-02-25 DIAGNOSIS — Z803 Family history of malignant neoplasm of breast: Secondary | ICD-10-CM | POA: Diagnosis not present

## 2020-02-25 DIAGNOSIS — Z510 Encounter for antineoplastic radiation therapy: Secondary | ICD-10-CM | POA: Diagnosis not present

## 2020-02-26 ENCOUNTER — Ambulatory Visit
Admission: RE | Admit: 2020-02-26 | Discharge: 2020-02-26 | Disposition: A | Discharge status: Home / Self Care | Payer: 59 | Source: Ambulatory Visit | Attending: Radiation Oncology | Admitting: Radiation Oncology

## 2020-02-26 ENCOUNTER — Other Ambulatory Visit: Payer: Self-pay

## 2020-02-26 DIAGNOSIS — Z79810 Long term (current) use of selective estrogen receptor modulators (SERMs): Secondary | ICD-10-CM | POA: Diagnosis not present

## 2020-02-26 DIAGNOSIS — Z510 Encounter for antineoplastic radiation therapy: Secondary | ICD-10-CM | POA: Diagnosis not present

## 2020-02-26 DIAGNOSIS — Z923 Personal history of irradiation: Secondary | ICD-10-CM | POA: Diagnosis not present

## 2020-02-26 DIAGNOSIS — Z803 Family history of malignant neoplasm of breast: Secondary | ICD-10-CM | POA: Diagnosis not present

## 2020-02-26 DIAGNOSIS — G4730 Sleep apnea, unspecified: Secondary | ICD-10-CM | POA: Diagnosis not present

## 2020-02-26 DIAGNOSIS — C50411 Malignant neoplasm of upper-outer quadrant of right female breast: Secondary | ICD-10-CM | POA: Diagnosis not present

## 2020-02-26 DIAGNOSIS — Z801 Family history of malignant neoplasm of trachea, bronchus and lung: Secondary | ICD-10-CM | POA: Diagnosis not present

## 2020-02-26 DIAGNOSIS — Z170 Estrogen receptor positive status [ER+]: Secondary | ICD-10-CM | POA: Diagnosis not present

## 2020-02-27 ENCOUNTER — Ambulatory Visit
Admission: RE | Admit: 2020-02-27 | Discharge: 2020-02-27 | Disposition: A | Discharge status: Home / Self Care | Payer: 59 | Source: Ambulatory Visit | Attending: Radiation Oncology | Admitting: Radiation Oncology

## 2020-02-27 DIAGNOSIS — C50411 Malignant neoplasm of upper-outer quadrant of right female breast: Secondary | ICD-10-CM | POA: Diagnosis not present

## 2020-02-27 DIAGNOSIS — Z923 Personal history of irradiation: Secondary | ICD-10-CM | POA: Diagnosis not present

## 2020-02-27 DIAGNOSIS — Z803 Family history of malignant neoplasm of breast: Secondary | ICD-10-CM | POA: Diagnosis not present

## 2020-02-27 DIAGNOSIS — Z510 Encounter for antineoplastic radiation therapy: Secondary | ICD-10-CM | POA: Diagnosis not present

## 2020-02-27 DIAGNOSIS — Z79810 Long term (current) use of selective estrogen receptor modulators (SERMs): Secondary | ICD-10-CM | POA: Diagnosis not present

## 2020-02-27 DIAGNOSIS — Z170 Estrogen receptor positive status [ER+]: Secondary | ICD-10-CM | POA: Diagnosis not present

## 2020-02-27 DIAGNOSIS — G4730 Sleep apnea, unspecified: Secondary | ICD-10-CM | POA: Diagnosis not present

## 2020-02-27 DIAGNOSIS — Z801 Family history of malignant neoplasm of trachea, bronchus and lung: Secondary | ICD-10-CM | POA: Diagnosis not present

## 2020-03-01 ENCOUNTER — Other Ambulatory Visit: Payer: Self-pay

## 2020-03-01 ENCOUNTER — Ambulatory Visit
Admission: RE | Admit: 2020-03-01 | Discharge: 2020-03-01 | Disposition: A | Discharge status: Home / Self Care | Payer: 59 | Source: Ambulatory Visit | Attending: Radiation Oncology | Admitting: Radiation Oncology

## 2020-03-01 DIAGNOSIS — G4730 Sleep apnea, unspecified: Secondary | ICD-10-CM | POA: Diagnosis not present

## 2020-03-01 DIAGNOSIS — Z170 Estrogen receptor positive status [ER+]: Secondary | ICD-10-CM | POA: Diagnosis not present

## 2020-03-01 DIAGNOSIS — Z79810 Long term (current) use of selective estrogen receptor modulators (SERMs): Secondary | ICD-10-CM | POA: Diagnosis not present

## 2020-03-01 DIAGNOSIS — C50411 Malignant neoplasm of upper-outer quadrant of right female breast: Secondary | ICD-10-CM | POA: Diagnosis not present

## 2020-03-01 DIAGNOSIS — Z510 Encounter for antineoplastic radiation therapy: Secondary | ICD-10-CM | POA: Diagnosis not present

## 2020-03-01 DIAGNOSIS — Z923 Personal history of irradiation: Secondary | ICD-10-CM | POA: Diagnosis not present

## 2020-03-01 DIAGNOSIS — Z803 Family history of malignant neoplasm of breast: Secondary | ICD-10-CM | POA: Diagnosis not present

## 2020-03-01 DIAGNOSIS — Z801 Family history of malignant neoplasm of trachea, bronchus and lung: Secondary | ICD-10-CM | POA: Diagnosis not present

## 2020-03-02 ENCOUNTER — Ambulatory Visit: Payer: 59 | Admitting: Radiation Oncology

## 2020-03-02 ENCOUNTER — Other Ambulatory Visit: Payer: Self-pay

## 2020-03-02 ENCOUNTER — Ambulatory Visit
Admission: RE | Admit: 2020-03-02 | Discharge: 2020-03-02 | Disposition: A | Discharge status: Home / Self Care | Payer: 59 | Source: Ambulatory Visit | Attending: Radiation Oncology | Admitting: Radiation Oncology

## 2020-03-02 ENCOUNTER — Inpatient Hospital Stay: Payer: 59 | Attending: Oncology | Admitting: Oncology

## 2020-03-02 VITALS — BP 106/41 | HR 71 | Temp 98.8°F | Resp 18 | Ht 68.0 in | Wt 143.7 lb

## 2020-03-02 DIAGNOSIS — Z510 Encounter for antineoplastic radiation therapy: Secondary | ICD-10-CM | POA: Insufficient documentation

## 2020-03-02 DIAGNOSIS — Z801 Family history of malignant neoplasm of trachea, bronchus and lung: Secondary | ICD-10-CM | POA: Diagnosis not present

## 2020-03-02 DIAGNOSIS — G4730 Sleep apnea, unspecified: Secondary | ICD-10-CM | POA: Insufficient documentation

## 2020-03-02 DIAGNOSIS — Z803 Family history of malignant neoplasm of breast: Secondary | ICD-10-CM | POA: Insufficient documentation

## 2020-03-02 DIAGNOSIS — Z923 Personal history of irradiation: Secondary | ICD-10-CM | POA: Insufficient documentation

## 2020-03-02 DIAGNOSIS — Z79810 Long term (current) use of selective estrogen receptor modulators (SERMs): Secondary | ICD-10-CM | POA: Diagnosis not present

## 2020-03-02 DIAGNOSIS — G4733 Obstructive sleep apnea (adult) (pediatric): Secondary | ICD-10-CM

## 2020-03-02 DIAGNOSIS — Z170 Estrogen receptor positive status [ER+]: Secondary | ICD-10-CM | POA: Insufficient documentation

## 2020-03-02 DIAGNOSIS — C50411 Malignant neoplasm of upper-outer quadrant of right female breast: Secondary | ICD-10-CM | POA: Insufficient documentation

## 2020-03-02 NOTE — Progress Notes (Signed)
[facility]  [phone] [phone]     ID: Amber [name] DOB: 05-26-1970  MR#: [id number]  HER#:[id number]  Patient Care Team: Debbrah Alar, NP as PCP - General (Internal Medicine) Mauro Kaufmann, RN as Oncology Nurse Navigator Rockwell Germany, RN as Oncology Nurse Navigator Gery Pray, MD as Consulting Physician (Radiation Oncology) Chanah Tidmore, Virgie Dad, MD as Consulting Physician (Oncology) Rolm Bookbinder, MD as Consulting Physician (General Surgery) Chauncey Cruel, MD OTHER MD:  CHIEF COMPLAINT: Estrogen receptor positive breast cancer  CURRENT TREATMENT: Adjuvant radiation therapy   INTERVAL HISTORY: Amber [name] returns today for follow up of her estrogen receptor positive breast cancer. She was evaluated in the breast cancer clinic on 11/26/2019.  She underwent genetic testing the same day. Results were negative, with the exception of a variant of uncertain significance in ATM.  She underwent breast MRI on 11/27/2019 showing: breast composition C; 5 mm area of non-mass enhancement in posterior upper-outer right breast at location of biopsy marker clip artifact, compatible with small area of residual DCIS or additional invasive malignancy; 5 mm area of focal non-mass enhancement in posterior lower-outer right breast, with indeterminate features; 3.6 cm area of patchy non-mass enhancement in lower-outer left breast, also with indeterminate features; no adenopathy.  She proceeded to biopsy of the left breast area in question on 12/08/2019. Pathology [id number]) showed benign breast tissue.  She underwent right lumpectomy on 01/02/2020 under Dr. Donne Hazel. Pathology from the procedure [id number]) showed: invasive ductal carcinoma, grade 1, 0.9 cm; ductal carcinoma in situ, intermediate grade; margins uninvolved.  All five biopsied lymph nodes were negative for carcinoma (0/5).  Oncotype DX was obtained on the final surgical  sample and the recurrence score of 16 predicts a risk of recurrence outside the breast over the next 9 years of 4%, if the patient's only systemic therapy is an antiestrogen for 5 years.  It also predicts no benefit from chemotherapy.   She was referred back to Dr. Sondra Come on 01/29/2020 to review radiation therapy. She subsequently began treatment on 02/09/2020 and is scheduled to finish on 03/09/2020.   REVIEW OF SYSTEMS: Amber [name] is exercising at the gym and by walking.  She is not doing a lot of lifting.  She has a little bit of discomfort in the right axilla and she is working with rehab particularly to release some cording.  She has not had any lymphedema.  She has had no peeling from the radiation and no significant fatigue.  A detailed review of systems was otherwise stable   COVID 19 VACCINATION STATUS: fully vaccinated [employer]), with booster 10/2019   HISTORY OF CURRENT ILLNESS: From the original intake note:  Amber [name] has a history of bilateral breast biopsies. 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 In 08/2017, a left breast biopsy showed only fibrocystic change.  She had routine screening mammography on 10/28/2019 showing a possible abnormality in the right breast. She underwent right diagnostic mammography with tomography and right breast ultrasonography at [facility] on 11/11/2019 showing: breast density category C; slight interval increase in size of right breast mass at 10 o'clock, immediately adjacent to the prior biopsy demonstrating PASH, now 1.4 cm; no axillary adenopathy.  Accordingly on 11/20/2019 she proceeded to biopsy of the right breast area in question. The pathology from this procedure ([id number]) showed: invasive ductal carcinoma, grade 1; ductal carcinoma in situ. The prognostic panel results were reported at conference this morning as showing estrogen receptor 95%  positive with strong staining, progesterone  receptor 50% positive with strong staining, HER-2 not amplified by immunohistochemistry, and MIB-1 of 2%  The patient's subsequent history is as detailed below.   PAST MEDICAL HISTORY: Past Medical History:  Diagnosis Date  . Deviated septum   . Malignant neoplasm of upper-outer quadrant of right breast in female, estrogen receptor positive ([place]) 11/24/2019  . Sleep apnea    Sleep apnea; on CPAP   PAST SURGICAL HISTORY: Past Surgical History:  Procedure Laterality Date  . BREAST BIOPSY Right 2019  . BREAST LUMPECTOMY WITH RADIOACTIVE SEED AND SENTINEL LYMPH NODE BIOPSY Right 01/02/2020   Procedure: RIGHT BREAST LUMPECTOMY WITH RADIOACTIVE SEED AND RIGHT AXILLARY SENTINEL LYMPH NODE BIOPSY;  Surgeon: Rolm Bookbinder, MD;  Location: [facility];  Service: General;  Laterality: Right;  PEC BLOCK  . DILATION AND EVACUATION  07/14/2001   miscarriage  . ENDOMETRIAL ABLATION W/ NOVASURE      FAMILY HISTORY: Family History  Problem Relation Age of Onset  . Breast cancer Paternal Aunt        dx after 2, triple negative  . Arthritis Mother   . GER disease Mother   . Arthritis Sister        psoriatic arthritis, colits (collagenous)  . Lung cancer Maternal Grandfather 59  The patient's father died in an accident in his 21s.  The patient's mother is 37 years old as of October 2021.  The patient has 1 sister, no brothers.  On the paternal side there is an aunt with possible cancer but the patient has very little data.  On the maternal side the patient's mother's father died from lung cancer in the setting of tobacco abuse   GYNECOLOGIC HISTORY:  No LMP recorded. Menarche: 50 years old Age at first live birth: 50 years old [place] P 3 LMP status post endometrial ablation Contraceptive husband is status post vasectomy HRT no  Hysterectomy?  No BSO?  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  Her  husband Sherren Mocha works in [employer] for [employer].  Their children are 17, 15 and 14.  Her 14 year old daughter is already committed to [facility] [employer] and has a Consulting civil engineer.  The patient attends [employer] in [place]: In the absence of any documentation to the contrary, the patient's spouse is their HCPOA.    HEALTH MAINTENANCE: Social History   Tobacco Use  . Smoking status: Never Smoker  . Smokeless tobacco: Never Used  Vaping Use  . Vaping Use: Never used  Substance Use Topics  . Alcohol use: Not Currently  . Drug use: Never     Colonoscopy: n/a (age)  PAP: Up-to-date  Bone density: Never   No Known Allergies  Current Outpatient Medications  Medication Sig Dispense Refill  . zolpidem (AMBIEN) 5 MG tablet Take 1 tablet (5 mg total) by mouth at bedtime as needed for sleep. 30 tablet 1   No current facility-administered medications for this visit.    OBJECTIVE: White woman in no acute distress  Vitals:   03/02/20 1539  BP: (!) 106/41  Pulse: 71  Resp: 18  Temp: 98.8 F (37.1 C)  SpO2: 100%     Body mass index is 21.85 kg/m.   Wt Readings from Last 3 Encounters:  03/02/20 143 lb 11.2 oz (65.2 kg)  02/02/20 141 lb (64 kg)  01/29/20 142 lb 12.8 oz (64.8 kg)  ECOG FS:1 - Symptomatic but completely ambulatory  Sclerae unicteric, EOMs intact Wearing a mask No cervical or supraclavicular adenopathy Lungs no rales or rhonchi Heart regular rate and rhythm Abd soft, nontender, positive bowel sounds MSK no focal spinal tenderness, no upper extremity lymphedema Neuro: nonfocal, well oriented, appropriate affect Breasts: The right breast is status post lumpectomy.  The cosmetic result is excellent.  The incisions are healing very nicely, without dehiscence or swelling.  She is also receiving radiation.  There is erythema over the radiation port area but no desquamation.  The left breast and both axillae are benign.   LAB RESULTS:  CMP      Component Value Date/Time   NA 140 11/26/2019 1547   K 3.8 11/26/2019 1547   CL 106 11/26/2019 1547   CO2 29 11/26/2019 1547   GLUCOSE 91 11/26/2019 1547   BUN 11 11/26/2019 1547   CREATININE 0.79 11/26/2019 1547   CREATININE 0.84 11/14/2018 0944   CALCIUM 9.3 11/26/2019 1547   PROT 6.6 11/26/2019 1547   ALBUMIN 3.8 11/26/2019 1547   AST 17 11/26/2019 1547   ALT 11 11/26/2019 1547   ALKPHOS 41 11/26/2019 1547   BILITOT 0.4 11/26/2019 1547   GFRNONAA >60 11/26/2019 1547   GFRNONAA 83 11/14/2018 0944   GFRAA 96 11/14/2018 0944    No results found for: TOTALPROTELP, ALBUMINELP, A1GS, A2GS, BETS, BETA2SER, GAMS, MSPIKE, SPEI  Lab Results  Component Value Date   WBC 5.1 01/26/2020   NEUTROABS 3.3 01/26/2020   HGB 12.6 01/26/2020   HCT 38.3 01/26/2020   MCV 88.6 01/26/2020   PLT 222.0 01/26/2020    No results found for: LABCA2  No components found for: IRSWNI627  No results for input(s): INR in the last 168 hours.  No results found for: LABCA2  No results found for: OJJ009  No results found for: FGH829  No results found for: HBZ169  No results found for: CA2729  No components found for: HGQUANT  No results found for: CEA1 / No results found for: CEA1   No results found for: AFPTUMOR  No results found for: CHROMOGRNA  No results found for: KPAFRELGTCHN, LAMBDASER, KAPLAMBRATIO (kappa/lambda light chains)  No results found for: HGBA, HGBA2QUANT, HGBFQUANT, HGBSQUAN (Hemoglobinopathy evaluation)   No results found for: LDH  Lab Results  Component Value Date   IRON 93 01/26/2020   (Iron and TIBC)  Lab Results  Component Value Date   FERRITIN 94.8 01/26/2020    Urinalysis    Component Value Date/Time   COLORURINE YELLOW 06/17/2018 1540   APPEARANCEUR CLEAR 06/17/2018 1540   LABSPEC <=1.005 (A) 06/17/2018 1540   PHURINE 6.5 06/17/2018 1540   GLUCOSEU NEGATIVE 06/17/2018 [place] 06/17/2018 1540   BILIRUBINUR NEGATIVE 06/17/2018  1540   KETONESUR NEGATIVE 06/17/2018 1540   UROBILINOGEN 0.2 06/17/2018 1540   NITRITE NEGATIVE 06/17/2018 Amber [name] 06/17/2018 1540    STUDIES: No results found.   ELIGIBLE FOR AVAILABLE RESEARCH PROTOCOL: AET  ASSESSMENT: 50 y.o. [facility] woman status post right breast upper outer quadrant biopsy 11/20/2019 for a clinical T1c N0, stage IA invasive ductal carcinoma, grade 1, estrogen and progesterone receptor positive, HER-2 not amplified, with an MIB-1 of 2%  (1) genetics testing 12/04/2019 through the [employer] Common Hereditary Cancers Panel found no deleterious mutations in APC, ATM, AXIN2, BARD1, BMPR1A, BRCA1, BRCA2, BRIP1, CDH1, CDK4, CDKN2A (p14ARF), CDKN2A (p16INK4a), CHEK2, CTNNA1, DICER1, EPCAM (Deletion/duplication testing only), GREM1 (promoter region deletion/duplication testing only),  KIT, MEN1, MLH1, MSH2, MSH3, MSH6, MUTYH, NBN, NF1, NHTL1, PALB2, PDGFRA, PMS2, POLD1, POLE, PTEN, RAD50, RAD51C, RAD51D, RNF43, SDHB, SDHC, SDHD, SMAD4, SMARCA4. STK11, TP53, TSC1, TSC2, and VHL.  The following genes were evaluated for sequence changes only: SDHA and HOXB13 [name]251G>A variant only.  (a) Variant of uncertain significance in ATM at [name]5081C>G (p.Ala1694Gly).   (2) status post right lumpectomy and sentinel lymph node sampling 01/02/2020 for a pT1b pN0, stage IA invasive ductal carcinoma, grade 1, with negative margins.  (3) Oncotype score of 16 predicts a risk of recurrence outside the breast in the next 9 years of 4% if the patient's only systemic therapy is antiestrogens for 5 years.  It also predicts no benefit from chemotherapy.  (4) adjuvant radiation in process  (5) to start tamoxifen 04/13/2020   PLAN: Amber [name] did very well with her surgery.  We reviewed the fact that she can expect to have some soreness sensitivity and occasional shooting pains or "zingers", which may come and go, and which may occur even years after the surgery.  All that is normal and  does not indicate breast cancer is active.  She is also tolerating radiation well.  She understands her right breast will feel a little bit different from the left, a little bit firmer, possibly hang a little bit differently because of the scarring caused by radiation.  These changes generally are very subtle.  She will be ready to start antiestrogens after radiation.  Today she received information on anastrozole versus tamoxifen and of course she is already very well-informed.  The plan is to start tamoxifen 04/13/2020.  Will have a virtual visit mid April just to make sure she is tolerating tamoxifen well and then she will have her next baseline mammography at the [place] end of May.  She will see me in June and before that visit she will have some lab work including an [facility] and estradiol level.  At that point we will decide whether yearly visits are sufficient or whether she would like to be seen every 6 months for a year and then switch to yearly  Total encounter time 35 minutes.Amber Jews C. Latesa Fratto, MD 03/02/2020 3:41 PM Medical Oncology and Hematology [facility] [place], [place] [address] Tel. [phone]    Fax. [phone]   This document serves as a record of services personally performed by Lurline Del, MD. It was created on his behalf by Wilburn Mylar, a trained medical scribe. The creation of this record is based on the scribe's personal observations and the provider's statements to them.   I, Lurline Del MD, have reviewed the above documentation for accuracy and completeness, and I agree with the above.   *Total Encounter Time as defined by the Centers for Medicare and Medicaid Services includes, in addition to the face-to-face time of a patient visit (documented in the note above) non-face-to-face time: obtaining and reviewing outside history, ordering and reviewing medications, tests or procedures, care coordination  (communications with other health care professionals or caregivers) and documentation in the medical record.

## 2020-03-03 ENCOUNTER — Ambulatory Visit: Payer: 59

## 2020-03-03 ENCOUNTER — Ambulatory Visit
Admission: RE | Admit: 2020-03-03 | Discharge: 2020-03-03 | Disposition: A | Discharge status: Home / Self Care | Payer: 59 | Source: Ambulatory Visit | Attending: Radiation Oncology | Admitting: Radiation Oncology

## 2020-03-03 ENCOUNTER — Other Ambulatory Visit: Payer: Self-pay

## 2020-03-03 ENCOUNTER — Telehealth: Payer: Self-pay | Admitting: Oncology

## 2020-03-03 DIAGNOSIS — Z79810 Long term (current) use of selective estrogen receptor modulators (SERMs): Secondary | ICD-10-CM | POA: Diagnosis not present

## 2020-03-03 DIAGNOSIS — Z170 Estrogen receptor positive status [ER+]: Secondary | ICD-10-CM | POA: Diagnosis not present

## 2020-03-03 DIAGNOSIS — Z923 Personal history of irradiation: Secondary | ICD-10-CM | POA: Diagnosis not present

## 2020-03-03 DIAGNOSIS — C50411 Malignant neoplasm of upper-outer quadrant of right female breast: Secondary | ICD-10-CM | POA: Diagnosis not present

## 2020-03-03 DIAGNOSIS — Z803 Family history of malignant neoplasm of breast: Secondary | ICD-10-CM | POA: Diagnosis not present

## 2020-03-03 DIAGNOSIS — Z510 Encounter for antineoplastic radiation therapy: Secondary | ICD-10-CM | POA: Diagnosis not present

## 2020-03-03 DIAGNOSIS — Z801 Family history of malignant neoplasm of trachea, bronchus and lung: Secondary | ICD-10-CM | POA: Diagnosis not present

## 2020-03-03 DIAGNOSIS — G4730 Sleep apnea, unspecified: Secondary | ICD-10-CM | POA: Diagnosis not present

## 2020-03-03 NOTE — Telephone Encounter (Signed)
Scheduled appts per 1/18 los. Pt confirmed appt dates and times.

## 2020-03-04 ENCOUNTER — Ambulatory Visit
Admission: RE | Admit: 2020-03-04 | Discharge: 2020-03-04 | Disposition: A | Discharge status: Home / Self Care | Payer: 59 | Source: Ambulatory Visit | Attending: Radiation Oncology | Admitting: Radiation Oncology

## 2020-03-04 DIAGNOSIS — Z79810 Long term (current) use of selective estrogen receptor modulators (SERMs): Secondary | ICD-10-CM | POA: Diagnosis not present

## 2020-03-04 DIAGNOSIS — C50411 Malignant neoplasm of upper-outer quadrant of right female breast: Secondary | ICD-10-CM | POA: Diagnosis not present

## 2020-03-04 DIAGNOSIS — Z170 Estrogen receptor positive status [ER+]: Secondary | ICD-10-CM | POA: Diagnosis not present

## 2020-03-04 DIAGNOSIS — G4730 Sleep apnea, unspecified: Secondary | ICD-10-CM | POA: Diagnosis not present

## 2020-03-04 DIAGNOSIS — Z803 Family history of malignant neoplasm of breast: Secondary | ICD-10-CM | POA: Diagnosis not present

## 2020-03-04 DIAGNOSIS — Z510 Encounter for antineoplastic radiation therapy: Secondary | ICD-10-CM | POA: Diagnosis not present

## 2020-03-04 DIAGNOSIS — Z923 Personal history of irradiation: Secondary | ICD-10-CM | POA: Diagnosis not present

## 2020-03-04 DIAGNOSIS — Z801 Family history of malignant neoplasm of trachea, bronchus and lung: Secondary | ICD-10-CM | POA: Diagnosis not present

## 2020-03-05 ENCOUNTER — Other Ambulatory Visit: Payer: Self-pay

## 2020-03-05 ENCOUNTER — Ambulatory Visit
Admission: RE | Admit: 2020-03-05 | Discharge: 2020-03-05 | Disposition: A | Discharge status: Home / Self Care | Payer: 59 | Source: Ambulatory Visit | Attending: Radiation Oncology | Admitting: Radiation Oncology

## 2020-03-05 DIAGNOSIS — Z803 Family history of malignant neoplasm of breast: Secondary | ICD-10-CM | POA: Diagnosis not present

## 2020-03-05 DIAGNOSIS — Z801 Family history of malignant neoplasm of trachea, bronchus and lung: Secondary | ICD-10-CM | POA: Diagnosis not present

## 2020-03-05 DIAGNOSIS — Z923 Personal history of irradiation: Secondary | ICD-10-CM | POA: Diagnosis not present

## 2020-03-05 DIAGNOSIS — C50411 Malignant neoplasm of upper-outer quadrant of right female breast: Secondary | ICD-10-CM | POA: Diagnosis not present

## 2020-03-05 DIAGNOSIS — Z510 Encounter for antineoplastic radiation therapy: Secondary | ICD-10-CM | POA: Diagnosis not present

## 2020-03-05 DIAGNOSIS — Z79810 Long term (current) use of selective estrogen receptor modulators (SERMs): Secondary | ICD-10-CM | POA: Diagnosis not present

## 2020-03-05 DIAGNOSIS — G4730 Sleep apnea, unspecified: Secondary | ICD-10-CM | POA: Diagnosis not present

## 2020-03-05 DIAGNOSIS — Z170 Estrogen receptor positive status [ER+]: Secondary | ICD-10-CM | POA: Diagnosis not present

## 2020-03-08 ENCOUNTER — Encounter: Payer: Self-pay | Admitting: *Deleted

## 2020-03-08 ENCOUNTER — Ambulatory Visit
Admission: RE | Admit: 2020-03-08 | Discharge: 2020-03-08 | Disposition: A | Discharge status: Home / Self Care | Payer: 59 | Source: Ambulatory Visit | Attending: Radiation Oncology | Admitting: Radiation Oncology

## 2020-03-08 ENCOUNTER — Ambulatory Visit: Payer: 59

## 2020-03-08 DIAGNOSIS — Z803 Family history of malignant neoplasm of breast: Secondary | ICD-10-CM | POA: Diagnosis not present

## 2020-03-08 DIAGNOSIS — Z170 Estrogen receptor positive status [ER+]: Secondary | ICD-10-CM | POA: Diagnosis not present

## 2020-03-08 DIAGNOSIS — Z923 Personal history of irradiation: Secondary | ICD-10-CM | POA: Diagnosis not present

## 2020-03-08 DIAGNOSIS — Z801 Family history of malignant neoplasm of trachea, bronchus and lung: Secondary | ICD-10-CM | POA: Diagnosis not present

## 2020-03-08 DIAGNOSIS — C50411 Malignant neoplasm of upper-outer quadrant of right female breast: Secondary | ICD-10-CM | POA: Diagnosis not present

## 2020-03-08 DIAGNOSIS — Z79810 Long term (current) use of selective estrogen receptor modulators (SERMs): Secondary | ICD-10-CM | POA: Diagnosis not present

## 2020-03-08 DIAGNOSIS — G4730 Sleep apnea, unspecified: Secondary | ICD-10-CM | POA: Diagnosis not present

## 2020-03-08 DIAGNOSIS — Z510 Encounter for antineoplastic radiation therapy: Secondary | ICD-10-CM | POA: Diagnosis not present

## 2020-03-09 ENCOUNTER — Encounter: Payer: Self-pay | Admitting: Radiation Oncology

## 2020-03-09 ENCOUNTER — Ambulatory Visit
Admission: RE | Admit: 2020-03-09 | Discharge: 2020-03-09 | Disposition: A | Discharge status: Home / Self Care | Payer: 59 | Source: Ambulatory Visit | Attending: Radiation Oncology | Admitting: Radiation Oncology

## 2020-03-09 DIAGNOSIS — Z801 Family history of malignant neoplasm of trachea, bronchus and lung: Secondary | ICD-10-CM | POA: Diagnosis not present

## 2020-03-09 DIAGNOSIS — G4730 Sleep apnea, unspecified: Secondary | ICD-10-CM | POA: Diagnosis not present

## 2020-03-09 DIAGNOSIS — Z803 Family history of malignant neoplasm of breast: Secondary | ICD-10-CM | POA: Diagnosis not present

## 2020-03-09 DIAGNOSIS — Z170 Estrogen receptor positive status [ER+]: Secondary | ICD-10-CM | POA: Diagnosis not present

## 2020-03-09 DIAGNOSIS — Z79810 Long term (current) use of selective estrogen receptor modulators (SERMs): Secondary | ICD-10-CM | POA: Diagnosis not present

## 2020-03-09 DIAGNOSIS — C50411 Malignant neoplasm of upper-outer quadrant of right female breast: Secondary | ICD-10-CM | POA: Diagnosis not present

## 2020-03-09 DIAGNOSIS — Z510 Encounter for antineoplastic radiation therapy: Secondary | ICD-10-CM | POA: Diagnosis not present

## 2020-03-09 DIAGNOSIS — Z923 Personal history of irradiation: Secondary | ICD-10-CM | POA: Diagnosis not present

## 2020-04-08 ENCOUNTER — Encounter: Payer: Self-pay | Admitting: *Deleted

## 2020-04-12 ENCOUNTER — Ambulatory Visit
Admission: RE | Admit: 2020-04-12 | Discharge: 2020-04-12 | Disposition: A | Discharge status: Home / Self Care | Payer: 59 | Source: Ambulatory Visit | Attending: Radiation Oncology | Admitting: Radiation Oncology

## 2020-04-12 ENCOUNTER — Other Ambulatory Visit: Payer: Self-pay

## 2020-04-12 ENCOUNTER — Encounter: Payer: Self-pay | Admitting: Radiation Oncology

## 2020-04-12 DIAGNOSIS — Z923 Personal history of irradiation: Secondary | ICD-10-CM | POA: Diagnosis not present

## 2020-04-12 DIAGNOSIS — R5383 Other fatigue: Secondary | ICD-10-CM | POA: Diagnosis not present

## 2020-04-12 DIAGNOSIS — C50411 Malignant neoplasm of upper-outer quadrant of right female breast: Secondary | ICD-10-CM | POA: Diagnosis not present

## 2020-04-12 DIAGNOSIS — R0602 Shortness of breath: Secondary | ICD-10-CM | POA: Diagnosis not present

## 2020-04-12 DIAGNOSIS — Z170 Estrogen receptor positive status [ER+]: Secondary | ICD-10-CM | POA: Insufficient documentation

## 2020-04-12 NOTE — Progress Notes (Signed)
Patient is here today for 1 month follow up for radiation to right breast completed 03/09/2020.  Patient denies having any pain.  Patient chief complaint is severe fatigue.  She reports that the last week of treatment her fatigue started and now it is severe.  She states that she can shower or do an activity and feel very tired.  She reports she is working extra as well and has children.  Patient reports some tenderness to the right breast/axilla/ arm.  She states that she has the exercises for lymphedema.  Patient states she still has some hyperpigmentation to the area.  She reports she had some shortness of breath as well along with the fatigue.  She is still trying to remain active as she can.  Vitals:   04/12/20 1559  BP: (!) 111/1  Pulse: 78  Resp: 18  Temp: 97.6 F (36.4 C)  TempSrc: Temporal  SpO2: 99%  Weight: 142 lb 6 oz (64.6 kg)  Height: 5\' 8"  (1.727 m)

## 2020-04-12 NOTE — Progress Notes (Incomplete)
Patient Name: Amber [name] °MRN: [id number] °DOB: 03/16/1970 °Referring Physician: MAGRINAT GUSTAV (Profile Not Attached) °Date of Service: 03/09/2020 °[facility], [place] ° °                                                      End Of Treatment Note ° °Diagnoses: C50.411-Malignant neoplasm of upper-outer quadrant of right female breast ° °Cancer Staging: Clinical Stage I (pT1b, pN0) Right Breast UOQ, Invasive Ductal Carcinoma with DCIS, ER/PR/Her2 pending, Grade 1 ° °Intent: Curative ° °Radiation Treatment Dates: 02/09/2020 through 03/09/2020 ° °Site: Right breast °Technique: 3D °Total Dose (Gy): 40.05/40.05 °Dose per Fx (Gy): 2.67 °Completed Fx: 15/15 °Beam Energies: 6X ° °Site: Right breast boost °Technique: specialPort °Total Dose (Gy): 10/10 °Dose per Fx (Gy): 2 °Completed Fx: 5/5 °Beam Energies: 6E, 9E ° °Narrative: The patient tolerated radiation therapy relatively well. She did report mild fatigue, right breast skin sensitivity, tenderness/swelling of the right breast, and some mild stiffness of the right shoulder towards the end of treatment. She denied chest pain and shortness of breath. On examination, the right breast showed some diffuse erythema, mild dry desquamation, edema in the areolar region, and mild swelling. However, there were no signs of infection or moist desquamation within the breast. ° °Plan: The patient will follow-up with radiation oncology in one month. ° °________________________________________________ ° ° °James D. Kinard, PhD, MD ° °This document serves as a record of services personally performed by James Kinard, MD. It was created on his behalf by Maleeha Khan, a trained medical scribe. The creation of this record is based on the scribe's personal observations and the provider's statements to them. This document  has been checked and approved by the attending provider. ° °

## 2020-04-12 NOTE — Progress Notes (Signed)
Radiation Oncology         [phone] ________________________________  Name: Amber [name] MRN: [id number]  Date: 04/12/2020  DOB: Jul 03, 1970  Follow-Up Visit Note  CC: Sandford Craze, NP  Magrinat, Valentino Hue, MD    ICD-10-CM   1. Malignant neoplasm of upper-outer quadrant of right breast in female, estrogen receptor positive (HCC)  C50.411    Z17.0     Diagnosis: ClinicalStageI(pT1b, pN0)RightBreastUOQ,Invasive DuctalCarcinoma with DCIS, ER/PR/Her2pending,Grade 1  Interval Since Last Radiation: One month and three days   Radiation Treatment Dates: 02/09/2020 through 03/09/2020  Site: Right breast Technique: 3D Total Dose (Gy): 40.05/40.05 Dose per Fx (Gy): 2.67 Completed Fx: 15/15 Beam Energies: 6X  Site: Right breast boost Technique: specialPort Total Dose (Gy): 10/10 Dose per Fx (Gy): 2 Completed Fx: 5/5 Beam Energies: 6E, 9E  Narrative:  The patient returns today for routine follow-up.  She reports significant fatigue at this time.  She began experiencing fatigue during the last week of her treatment but not severe.  She continues have a very busy schedule with working as a Publishing rights manager with  pulmonary medicine and she is also taking on extra part-time job working from home as well as having children at home to care for.  Reports her energy stamina is not as good at this point.  She reports some shortness of breath with exertion.  She denies any significant itching or pain within the right breast.  She denies any nipple discharge or bleeding.                         ALLERGIES:  has No Known Allergies.  Meds: Current Outpatient Medications  Medication Sig Dispense Refill  . zolpidem (AMBIEN) 5 MG tablet Take 1 tablet (5 mg total) by mouth at bedtime as needed for sleep. 30 tablet 1   No current facility-administered medications for this encounter.    Physical Findings: The patient is in no acute distress. Patient is alert and oriented.   height is 5\' 8"  (1.727 m) and weight is 142 lb 6 oz (64.6 kg). Her temporal temperature is 97.6 F (36.4 C). Her blood pressure is 111/1 (abnormal) and her pulse is 78. Her respiration is 18 and oxygen saturation is 99%.   Lungs are clear to auscultation bilaterally. Heart has regular rate and rhythm. No palpable cervical, supraclavicular, or axillary adenopathy. . Right breast: Skin is well-healed.  Some hyperpigmentation changes noted and some mild edema in the breast.  No nipple discharge or bleeding.  No dominant mass appreciated in the breast.  Lab Findings: Lab Results  Component Value Date   WBC 5.1 01/26/2020   HGB 12.6 01/26/2020   HCT 38.3 01/26/2020   MCV 88.6 01/26/2020   PLT 222.0 01/26/2020    Radiographic Findings: No results found.  Impression: ClinicalStageI(pT1b, pN0)RightBreastUOQ,Invasive DuctalCarcinoma with DCIS, ER/PR/Her2pending,Grade 1  The patient is recovering from the effects of radiation but still has a lot of fatigue at this time.  Plan: The patient is scheduled to follow up with Dr. 01/28/2020 on 05/26/2020. She will follow up with radiation oncology in as needed basis in light of her close follow-up with medical oncology.  She is tentatively scheduled to start adjuvant hormonal therapy with tamoxifen on March 1.    ____________________________________   09-10-1988, PhD, MD  This document serves as a record of services personally performed by Billie Lade, MD. It was created on his behalf by Antony Blackbird, a trained  medical scribe. The creation of this record is based on the scribe's personal observations and the provider's statements to them. This document has been checked and approved by the attending provider.

## 2020-04-14 ENCOUNTER — Encounter: Payer: Self-pay | Admitting: Oncology

## 2020-04-16 ENCOUNTER — Other Ambulatory Visit: Payer: Self-pay | Admitting: Oncology

## 2020-04-16 ENCOUNTER — Other Ambulatory Visit: Payer: Self-pay

## 2020-04-16 MED ORDER — TAMOXIFEN CITRATE 20 MG PO TABS: 20.0000 mg | ORAL_TABLET | Freq: Every day | ORAL | 4 refills | Status: DC

## 2020-04-16 MED FILL — TAMOXIFEN 20 MG TABLET: 20 | 90 days supply | Qty: 90 | Fill #0

## 2020-04-26 ENCOUNTER — Ambulatory Visit: Payer: Self-pay

## 2020-05-14 ENCOUNTER — Other Ambulatory Visit (HOSPITAL_COMMUNITY): Payer: Self-pay | Admitting: *Deleted

## 2020-05-25 ENCOUNTER — Other Ambulatory Visit: Payer: Self-pay

## 2020-05-25 ENCOUNTER — Ambulatory Visit (HOSPITAL_BASED_OUTPATIENT_CLINIC_OR_DEPARTMENT_OTHER)
Admission: RE | Admit: 2020-05-25 | Discharge: 2020-05-25 | Disposition: A | Discharge status: Home / Self Care | Payer: 59 | Source: Ambulatory Visit | Attending: Cardiology | Admitting: Cardiology

## 2020-05-25 NOTE — Progress Notes (Signed)
[facility]  [phone] [phone]) [phone]     ID: Berlinda Farve Ramey DOB: [birth year]  MR#: [id number]  TTS#:[id number]  Patient Care Team: Debbrah Alar, NP as PCP - General (Internal Medicine) Mauro Kaufmann, RN as Oncology Nurse Navigator Rockwell Germany, RN as Oncology Nurse Navigator Gery Pray, MD as Consulting Physician (Radiation Oncology) Magrinat, Virgie Dad, MD as Consulting Physician (Oncology) Rolm Bookbinder, MD as Consulting Physician (General Surgery) Chauncey Cruel, MD OTHER MD:  I connected with Melvenia Needles on 05/26/20 at  8:30 AM EDT by video enabled telemedicine visit and verified that I am speaking with the correct person using two identifiers.   I discussed the limitations, risks, security and privacy concerns of performing an evaluation and management service by telemedicine and the availability of in-person appointments. I also discussed with the patient that there may be a patient responsible charge related to this service. The patient expressed understanding and agreed to proceed.   Other persons participating in the visit and their role in the encounter: None  Patient's location: Work Provider's location: [facility]  Total time spent: 15 min   CHIEF COMPLAINT: Estrogen receptor positive breast cancer  CURRENT TREATMENT: tamoxifen   INTERVAL HISTORY: Countess was contacted today for follow up of her estrogen receptor positive breast cancer.   Since her last visit, she completed radiation therapy under Dr. Sondra Come on 03/09/2020.  She still has a little bit of sensitivity and rawness in that area but it is "not a big deal".  She did do physical therapy.  She thinks she needs a little bit more although she is continuing to do the exercises she was taught.  She began tamoxifen on 04/16/2020.  So far she has had no side effects that she is aware of.  Recall that she is status post endometrial ablation so she  does not really have periods but she does feel like she is still cycling.  In particular hot flashes or vaginal wetness or vaginal dryness are not an issue.  She is scheduled for mammography on 07/08/2020.  Note she has never had a bone density   REVIEW OF SYSTEMS: A detailed review of systems today was otherwise stable  COVID 19 VACCINATION STATUS: fully vaccinated [employer]), with booster 10/2019   HISTORY OF CURRENT ILLNESS: From the original intake note:  Kitzia S Ayer has a history of bilateral breast biopsies. 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 In 08/2017, a left breast biopsy showed only fibrocystic change.  She had routine screening mammography on 10/28/2019 showing a possible abnormality in the right breast. 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  Accordingly on 11/20/2019 she proceeded to biopsy of the right breast area in question. The pathology from this procedure ([id number]) showed: invasive ductal carcinoma, grade 1; ductal carcinoma in situ. The prognostic panel results were reported at conference this morning as showing estrogen receptor 95% positive with strong staining, progesterone receptor 50% positive with strong staining, HER-2 not amplified by immunohistochemistry, and MIB-1 of 2%  The patient's subsequent history is as detailed below.   PAST MEDICAL HISTORY: Past Medical History:  Diagnosis Date  . Deviated septum   . History of radiation therapy 02/09/2020-03/09/2020  Right Breast; Dr. Gery Pray  . Malignant neoplasm of upper-outer quadrant of right breast in female, estrogen receptor positive ([place]) 11/24/2019  . Sleep apnea     Sleep apnea; on CPAP   PAST SURGICAL HISTORY: Past Surgical History:  Procedure Laterality Date  . BREAST BIOPSY Right 2019  . BREAST LUMPECTOMY WITH RADIOACTIVE SEED AND SENTINEL LYMPH NODE BIOPSY Right 01/02/2020   Procedure: RIGHT BREAST LUMPECTOMY WITH RADIOACTIVE SEED AND RIGHT AXILLARY SENTINEL LYMPH NODE BIOPSY;  Surgeon: Rolm Bookbinder, MD;  Location: [facility];  Service: General;  Laterality: Right;  PEC BLOCK  . DILATION AND EVACUATION  07/14/2001   miscarriage  . ENDOMETRIAL ABLATION W/ NOVASURE      FAMILY HISTORY: Family History  Problem Relation Age of Onset  . Breast cancer Paternal Aunt        dx after 82, triple negative  . Arthritis Mother   . GER disease Mother   . Arthritis Sister        psoriatic arthritis, colits (collagenous)  . Lung cancer Maternal Grandfather 48  The patient's father died in an accident in his 31s.  The patient's mother is 48 years old as of October 2021.  The patient has 1 sister, no brothers.  On the paternal side there is an aunt with possible cancer but the patient has very little data.  On the maternal side the patient's mother's father died from lung cancer in the setting of tobacco abuse   GYNECOLOGIC HISTORY:  No LMP recorded. Menarche: 51 years old Age at first live birth: 50 years old [place] P 3 LMP status post endometrial ablation Contraceptive husband is status post vasectomy HRT no  Hysterectomy?  No BSO?  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  Her husband Sherren Mocha works in [employer] for [employer].  Their children are 17, 15 and 14.  Her 76 year old daughter is already committed to [employer] [facility] [employer] and has a Consulting civil engineer.  The patient attends [employer] in [place]: In the absence of any documentation to the contrary, the patient's spouse is their HCPOA.    HEALTH MAINTENANCE: Social History   Tobacco Use  .  Smoking status: Never Smoker  . Smokeless tobacco: Never Used  Vaping Use  . Vaping Use: Never used  Substance Use Topics  . Alcohol use: Not Currently  . Drug use: Never     Colonoscopy: n/a (age)  PAP: Up-to-date  Bone density: Never   No Known Allergies  Current Outpatient Medications  Medication Sig Dispense Refill  . tamoxifen (NOLVADEX) 20 MG tablet TAKE 1 TABLET BY MOUTH DAILY 90 tablet 4  . zolpidem (AMBIEN) 5 MG tablet TAKE 1 TABLET BY MOUTH AT BEDTIME AS NEEDED FOR SLEEP 30 tablet 1   No current facility-administered medications for this visit.    OBJECTIVE: White woman who appears younger than stated age  There were no vitals filed for this visit.   There is no height or weight on file to calculate BMI.   Wt Readings from Last 3 Encounters:  04/12/20 142 lb 6 oz (64.6 kg)  03/02/20 143 lb 11.2 oz (65.2 kg)  02/02/20 141 lb (64 kg)      ECOG FS:1 - Symptomatic but completely ambulatory  Telemedicine visit 05/26/2020  LAB RESULTS:  CMP     Component Value Date/Time   NA 140 11/26/2019 1547   K 3.8 11/26/2019 1547  CL 106 11/26/2019 1547   CO2 29 11/26/2019 1547   GLUCOSE 91 11/26/2019 1547   BUN 11 11/26/2019 1547   CREATININE 0.79 11/26/2019 1547   CREATININE 0.84 11/14/2018 0944   CALCIUM 9.3 11/26/2019 1547   PROT 6.6 11/26/2019 1547   ALBUMIN 3.8 11/26/2019 1547   AST 17 11/26/2019 1547   ALT 11 11/26/2019 1547   ALKPHOS 41 11/26/2019 1547   BILITOT 0.4 11/26/2019 1547   GFRNONAA >60 11/26/2019 1547   GFRNONAA 83 11/14/2018 0944   GFRAA 96 11/14/2018 0944    No results found for: TOTALPROTELP, ALBUMINELP, A1GS, A2GS, BETS, BETA2SER, GAMS, MSPIKE, SPEI  Lab Results  Component Value Date   WBC 5.1 01/26/2020   NEUTROABS 3.3 01/26/2020   HGB 12.6 01/26/2020   HCT 38.3 01/26/2020   MCV 88.6 01/26/2020   PLT 222.0 01/26/2020    No results found for: LABCA2  No components found for: [id number]  No results for input(s): INR in the last  168 hours.  No results found for: LABCA2  No results found for: WPY099  No results found for: IPJ825  No results found for: KNL976  No results found for: CA2729  No components found for: HGQUANT  No results found for: CEA1 / No results found for: CEA1   No results found for: AFPTUMOR  No results found for: CHROMOGRNA  No results found for: KPAFRELGTCHN, LAMBDASER, KAPLAMBRATIO (kappa/lambda light chains)  No results found for: HGBA, HGBA2QUANT, HGBFQUANT, HGBSQUAN (Hemoglobinopathy evaluation)   No results found for: LDH  Lab Results  Component Value Date   IRON 93 01/26/2020   (Iron and TIBC)  Lab Results  Component Value Date   FERRITIN 94.8 01/26/2020    Urinalysis    Component Value Date/Time   COLORURINE YELLOW 06/17/2018 1540   APPEARANCEUR CLEAR 06/17/2018 1540   LABSPEC <=1.005 (A) 06/17/2018 1540   PHURINE 6.5 06/17/2018 1540   GLUCOSEU NEGATIVE 06/17/2018 1540   HGBUR NEGATIVE 06/17/2018 [place] 06/17/2018 1540   KETONESUR NEGATIVE 06/17/2018 1540   UROBILINOGEN 0.2 06/17/2018 1540   NITRITE NEGATIVE 06/17/2018 1540   LEUKOCYTESUR NEGATIVE 06/17/2018 1540    STUDIES: CT CARDIAC CALCIUM SCORING (DOCTOR'S DAY ONLY)  Addendum Date: 05/25/2020   ADDENDUM REPORT: 05/25/2020 19:26 CLINICAL DATA:  Cardiovascular Disease Risk stratification EXAM: Coronary Calcium Score TECHNIQUE: A gated, non-contrast computed tomography scan of the heart was performed using 20m slice thickness. Axial images were analyzed on a dedicated workstation. Calcium scoring of the coronary arteries was performed using the Agatston method. FINDINGS: Coronary arteries: Normal origins. Coronary Calcium Score: Left main: 0 Left anterior descending artery: 0 Left circumflex artery: 0 Right coronary artery: 0 Total: 0 Percentile: 0 Pericardium: Normal. Aorta: Normal caliber of ascending aorta. No aortic atherosclerosis noted. Non-cardiac: [facility]. IMPRESSION: Coronary calcium score of 0. This was 0 percentile for age-, race-, and sex-matched controls. RECOMMENDATIONS: Coronary artery calcium (CAC) score is a strong predictor of incident coronary heart disease (CHD) and provides predictive information beyond traditional risk factors. CAC scoring is reasonable to use in the decision to withhold, postpone, or initiate statin therapy in intermediate-risk or selected borderline-risk asymptomatic adults (age 49-75years and LDL-C >=70 to <190 mg/dL) who do not have diabetes or established atherosclerotic cardiovascular disease (ASCVD).* In intermediate-risk (10-year ASCVD risk >=7.5% to <20%) adults or selected borderline-risk (10-year ASCVD risk >=5% to <7.5%) adults in whom a CAC score is measured for the purpose of making a  treatment decision the following recommendations have been made: If CAC=0, it is reasonable to withhold statin therapy and reassess in 5 to 10 years, as long as higher risk conditions are absent (diabetes mellitus, family history of premature CHD in first degree relatives (males <55 years; females <65 years), cigarette smoking, or LDL >=190 mg/dL). If CAC is 1 to 99, it is reasonable to initiate statin therapy for patients >=56 years of age. If CAC is >=100 or >=75th percentile, it is reasonable to initiate statin therapy at any age. Cardiology referral should be considered for patients with CAC scores >=400 or >=75th percentile. *2018 AHA/ACC/AACVPR/AAPA/ABC/ACPM/ADA/AGS/APhA/ASPC/NLA/PCNA Guideline on the Management of Blood Cholesterol: A Report of the American College of Cardiology/American Heart Association Task Force on Clinical Practice Guidelines. J Am Coll Cardiol. 2019;73(24):3168-3209. Buford Dresser, MD Electronically Signed   By: Buford Dresser M.D.   On: 05/25/2020 19:26   Result Date: 05/25/2020 EXAM: OVER-READ INTERPRETATION  CT CHEST The following report is an over-read performed by  radiologist Dr. Aletta Edouard of [facility], [place] on 05/25/2020. This over-read does not include interpretation of cardiac or coronary anatomy or pathology. The coronary calcium score interpretation by the cardiologist is attached. COMPARISON:  None. FINDINGS: Vascular: No significant noncardiac vascular findings. Mediastinum/Nodes: Visualized mediastinum and hilar regions demonstrate no lymphadenopathy or masses. Lungs/Pleura: Visualized lungs show no evidence of pulmonary edema, consolidation, pneumothorax, nodule or pleural fluid. Upper Abdomen: No acute abnormality. Musculoskeletal: No chest wall mass or suspicious bone lesions identified. IMPRESSION: No significant incidental findings. Electronically Signed: By: Aletta Edouard M.D. On: 05/25/2020 08:26     ELIGIBLE FOR AVAILABLE RESEARCH PROTOCOL: AET  ASSESSMENT: 49 [facility].o. [facility] woman status post right breast upper outer quadrant biopsy 11/20/2019 for a clinical T1c N0, stage IA invasive ductal carcinoma, grade 1, estrogen and progesterone receptor positive, HER-2 not amplified, with an MIB-1 of 2%  (1) genetics testing 12/04/2019 through the [employer] Common Hereditary Cancers Panel found no deleterious mutations in APC, ATM, AXIN2, BARD1, BMPR1A, BRCA1, BRCA2, BRIP1, CDH1, CDK4, CDKN2A (p14ARF), CDKN2A (p16INK4a), CHEK2, CTNNA1, DICER1, EPCAM (Deletion/duplication testing only), GREM1 (promoter region deletion/duplication testing only), KIT, MEN1, MLH1, MSH2, MSH3, MSH6, MUTYH, NBN, NF1, NHTL1, PALB2, PDGFRA, PMS2, POLD1, POLE, PTEN, RAD50, RAD51C, RAD51D, RNF43, SDHB, SDHC, SDHD, SMAD4, SMARCA4. STK11, TP53, TSC1, TSC2, and VHL.  The following genes were evaluated for sequence changes only: SDHA and HOXB13 c.251G>A variant only.  (a) Variant of uncertain significance in ATM at c.5081C>G (p.Ala1694Gly).   (2) status post right lumpectomy and sentinel lymph node sampling 01/02/2020 for a pT1b pN0, stage IA invasive ductal carcinoma, grade  1, with negative margins.  (3) Oncotype score of 16 predicts a risk of recurrence outside the breast in the next 9 years of 4% if the patient's only systemic therapy is antiestrogens for 5 years.  It also predicts no benefit from chemotherapy.  (4) adjuvant radiation  02/09/2020 through 03/09/2020  Site: Right breast Technique: 3D Total Dose (Gy): 40.05/40.05 Dose per Fx (Gy): 2.67 Completed Fx: 15/15 Beam Energies: 6X  Site: Right breast boost Technique: specialPort Total Dose (Gy): 10/10 Dose per Fx (Gy): 2 Completed Fx: 5/5 Beam Energies: 6E, 9E  (5) started tamoxifen 04/13/2020  (a) bone density 07/08/2020  (b) FSH and estradiol level   PLAN: Brynne did fine with her radiation treatments.  She is planning to contact physical therapy for her a little additional work but she is continuing to do the appropriate exercises.  Recall she is status post endometrial  ablation.  She is likely still cycling and she does subjectively has sensations consistent with that.  Recommend to check an [facility] and estradiol level prior to her visit with me just to document.  She understands tamoxifen is not a contraceptive.  Recall her husband is status post vasectomy.  She is scheduled for new baseline mammography in May.  I am adding a bone density for baseline purposes.  She will have lab work shortly thereafter and see me 08/02/2020 and what now will be routine follow-up  She knows to call for any other issue that may develop before then.   Virgie Dad. Magrinat, MD 05/26/2020 8:37 AM Medical Oncology and Hematology [facility] [place], [place] [address] Tel. [phone]    Fax. [phone]   This document serves as a record of services personally performed by Lurline Del, MD. It was created on his behalf by Wilburn Mylar, a trained medical scribe. The creation of this record is based on the scribe's personal observations and the provider's statements to  them.   I, Lurline Del MD, have reviewed the above documentation for accuracy and completeness, and I agree with the above.   *Total Encounter Time as defined by the Centers for Medicare and Medicaid Services includes, in addition to the face-to-face time of a patient visit (documented in the note above) non-face-to-face time: obtaining and reviewing outside history, ordering and reviewing medications, tests or procedures, care coordination (communications with other health care professionals or caregivers) and documentation in the medical record.

## 2020-05-26 ENCOUNTER — Inpatient Hospital Stay: Payer: 59 | Attending: Oncology | Admitting: Oncology

## 2020-05-26 DIAGNOSIS — C50411 Malignant neoplasm of upper-outer quadrant of right female breast: Secondary | ICD-10-CM

## 2020-05-26 DIAGNOSIS — Z170 Estrogen receptor positive status [ER+]: Secondary | ICD-10-CM

## 2020-07-01 ENCOUNTER — Telehealth: Payer: Self-pay | Admitting: Oncology

## 2020-07-01 NOTE — Telephone Encounter (Signed)
R/s appt per 5/19 sch msg. Pt aware.  

## 2020-07-07 DIAGNOSIS — G4733 Obstructive sleep apnea (adult) (pediatric): Secondary | ICD-10-CM | POA: Diagnosis not present

## 2020-07-08 ENCOUNTER — Ambulatory Visit
Admission: RE | Admit: 2020-07-08 | Discharge: 2020-07-08 | Disposition: A | Discharge status: Home / Self Care | Payer: 59 | Source: Ambulatory Visit | Attending: Oncology | Admitting: Oncology

## 2020-07-08 ENCOUNTER — Other Ambulatory Visit: Payer: Self-pay

## 2020-07-08 ENCOUNTER — Other Ambulatory Visit: Payer: Self-pay | Admitting: Oncology

## 2020-07-08 DIAGNOSIS — Z170 Estrogen receptor positive status [ER+]: Secondary | ICD-10-CM

## 2020-07-08 DIAGNOSIS — R928 Other abnormal and inconclusive findings on diagnostic imaging of breast: Secondary | ICD-10-CM

## 2020-07-08 DIAGNOSIS — R922 Inconclusive mammogram: Secondary | ICD-10-CM | POA: Diagnosis not present

## 2020-07-08 DIAGNOSIS — C50411 Malignant neoplasm of upper-outer quadrant of right female breast: Secondary | ICD-10-CM

## 2020-07-08 DIAGNOSIS — G4733 Obstructive sleep apnea (adult) (pediatric): Secondary | ICD-10-CM

## 2020-07-08 DIAGNOSIS — N6489 Other specified disorders of breast: Secondary | ICD-10-CM | POA: Diagnosis not present

## 2020-07-08 DIAGNOSIS — Z853 Personal history of malignant neoplasm of breast: Secondary | ICD-10-CM | POA: Diagnosis not present

## 2020-07-28 ENCOUNTER — Other Ambulatory Visit (HOSPITAL_COMMUNITY): Payer: Self-pay

## 2020-07-28 MED FILL — Zolpidem Tartrate Tab 5 MG: ORAL | 30 days supply | Qty: 30 | Fill #0 | Status: AC

## 2020-07-28 MED FILL — Tamoxifen Citrate Tab 20 MG (Base Equivalent): ORAL | 90 days supply | Qty: 90 | Fill #0 | Status: AC

## 2020-08-02 ENCOUNTER — Other Ambulatory Visit: Payer: 59

## 2020-08-02 ENCOUNTER — Ambulatory Visit: Payer: 59 | Admitting: Oncology

## 2020-08-17 ENCOUNTER — Telehealth: Payer: Self-pay | Admitting: *Deleted

## 2020-08-17 ENCOUNTER — Inpatient Hospital Stay: Payer: 59 | Admitting: Oncology

## 2020-08-17 ENCOUNTER — Other Ambulatory Visit: Payer: Self-pay

## 2020-08-17 ENCOUNTER — Inpatient Hospital Stay: Payer: 59 | Attending: Oncology

## 2020-08-17 VITALS — BP 102/57 | HR 70 | Temp 97.7°F | Resp 18 | Ht 68.0 in | Wt 136.4 lb

## 2020-08-17 DIAGNOSIS — G4730 Sleep apnea, unspecified: Secondary | ICD-10-CM | POA: Diagnosis not present

## 2020-08-17 DIAGNOSIS — Z170 Estrogen receptor positive status [ER+]: Secondary | ICD-10-CM | POA: Insufficient documentation

## 2020-08-17 DIAGNOSIS — C50411 Malignant neoplasm of upper-outer quadrant of right female breast: Secondary | ICD-10-CM | POA: Insufficient documentation

## 2020-08-17 DIAGNOSIS — G4733 Obstructive sleep apnea (adult) (pediatric): Secondary | ICD-10-CM

## 2020-08-17 DIAGNOSIS — Z803 Family history of malignant neoplasm of breast: Secondary | ICD-10-CM | POA: Insufficient documentation

## 2020-08-17 DIAGNOSIS — Z801 Family history of malignant neoplasm of trachea, bronchus and lung: Secondary | ICD-10-CM | POA: Insufficient documentation

## 2020-08-17 DIAGNOSIS — Z923 Personal history of irradiation: Secondary | ICD-10-CM | POA: Insufficient documentation

## 2020-08-17 DIAGNOSIS — Z79810 Long term (current) use of selective estrogen receptor modulators (SERMs): Secondary | ICD-10-CM | POA: Insufficient documentation

## 2020-08-17 DIAGNOSIS — N6489 Other specified disorders of breast: Secondary | ICD-10-CM | POA: Diagnosis not present

## 2020-08-17 LAB — COMPREHENSIVE METABOLIC PANEL
ALT: 12 U/L (ref 0–44)
AST: 18 U/L (ref 15–41)
Albumin: 3.6 g/dL (ref 3.5–5.0)
Alkaline Phosphatase: 38 U/L (ref 38–126)
Anion gap: 6 (ref 5–15)
BUN: 18 mg/dL (ref 6–20)
CO2: 27 mmol/L (ref 22–32)
Calcium: 8.8 mg/dL — ABNORMAL LOW (ref 8.9–10.3)
Chloride: 108 mmol/L (ref 98–111)
Creatinine, Ser: 0.75 mg/dL (ref 0.44–1.00)
GFR, Estimated: >60 mL/min (ref >60)
Glucose, Bld: 106 mg/dL — ABNORMAL HIGH (ref 70–99)
Potassium: 4 mmol/L (ref 3.5–5.1)
Sodium: 141 mmol/L (ref 135–145)
Total Bilirubin: 0.4 mg/dL (ref 0.3–1.2)
Total Protein: 6.6 g/dL (ref 6.5–8.1)

## 2020-08-17 LAB — CBC WITH DIFFERENTIAL/PLATELET
Abs Immature Granulocytes: 0.02 10*3/uL (ref 0.00–0.07)
Basophils Absolute: 0 10*3/uL (ref 0.0–0.1)
Basophils Relative: 1 %
Eosinophils Absolute: 0.1 10*3/uL (ref 0.0–0.5)
Eosinophils Relative: 1 %
HCT: 35.9 % — ABNORMAL LOW (ref 36.0–46.0)
Hemoglobin: 12.2 g/dL (ref 12.0–15.0)
Immature Granulocytes: 0 %
Lymphocytes Relative: 18 %
Lymphs Abs: 0.9 10*3/uL (ref 0.7–4.0)
MCH: 30.3 pg (ref 26.0–34.0)
MCHC: 34 g/dL (ref 30.0–36.0)
MCV: 89.1 fL (ref 80.0–100.0)
Monocytes Absolute: 0.5 10*3/uL (ref 0.1–1.0)
Monocytes Relative: 11 %
Neutro Abs: 3.5 10*3/uL (ref 1.7–7.7)
Neutrophils Relative %: 69 %
Platelets: 173 10*3/uL (ref 150–400)
RBC: 4.03 MIL/uL (ref 3.87–5.11)
RDW: 13 % (ref 11.5–15.5)
WBC: 5 10*3/uL (ref 4.0–10.5)
nRBC: 0 % (ref 0.0–0.2)

## 2020-08-17 NOTE — Telephone Encounter (Signed)
PAXLOVID   Paxlovid (nirmatelvir 300/Ritonavir100) - BID x 5 days - for GFR >= 60 which was documented 08/17/2020   PLEASE CHECK MED LIST for the following issues. Please check the 2 different condition related to concomitant medications in alphabetical order  If Demiyah S Zertuche with DOB 1970-10-09 is on any of the following Strong CYP3A inhibtors - this patient Kruti S Buescher should withold these concomitant meds so they can start paxlovid stratight away. If taking any of these:  alfuzosin, amiodarone, clozapine, colchicine, dihydroergotamine, dronedarone, ergotamine, flecainide, lovastatin, lurasidone, methylergonovine, midazolam [oral], pethidine, pimozide, propafenone, propoxyphene, quinidine, ranolazine, sildenafil simvastatin, triazolam).   If   Elaysia S Braver  with dob [birth year] Is on any of these other strong CYP3A inducers then starting paxlovid should be delayed and the following meds should wash out first. These are dapalutamide, carbamazepine, phenobarbital, phenytoin, rifampin, St John's wort) - let me know immediately and we should delay starting paxlovid by some days even if he stops these medication.    PLEASE INFORM Rekisha S Yablonski  OF FOLLOWING SIDE EFFECTS  Side effects - all < 5%  - skin rash (and veyr rare a conditon called TEN) - angiomedia  - myalgia - jaundice - high bP (1%) - loss of taste  - diarrhea - rebound < 5% covid

## 2020-08-17 NOTE — Progress Notes (Addendum)
[facility]  [phone] [phone]     ID: Amber [name] DOB: 15-Jun-1970  MR#: [id number]  IAX#:[id number]  Patient Care Team: Debbrah Alar, NP as PCP - General (Internal Medicine) Mauro Kaufmann, RN as Oncology Nurse Navigator Rockwell Germany, RN as Oncology Nurse Navigator Gery Pray, MD as Consulting Physician (Radiation Oncology) Kenyotta Dorfman, Virgie Dad, MD as Consulting Physician (Oncology) Rolm Bookbinder, MD as Consulting Physician (General Surgery) Chauncey Cruel, MD OTHER MD:   CHIEF COMPLAINT: Estrogen receptor positive breast cancer  CURRENT TREATMENT: tamoxifen   INTERVAL HISTORY: [name] returns today for follow up of her estrogen receptor positive breast cancer.   She began tamoxifen on 04/16/2020.  So far she has had no side effects that she is aware of.  Recall that she is status post endometrial ablation so she does not really have periods but she does feel like she is still cycling.  In particular hot flashes or vaginal wetness or vaginal dryness are not an issue.  Since her last visit, she underwent bilateral diagnostic mammography with tomography and right breast ultrasonography at [facility] on 07/08/2020 showing: breast density category C; expected postsurgical changes in right breast; no evidence of malignancy in either breast.  She is scheduled for bone density screening on 11/30/2020 and for annual mammography on 01/10/2021.   REVIEW OF SYSTEMS: Shamell is back to work full-time and enjoying it.  She has a house at the [place] and she does a lot of paddle boarding there.  She otherwise exercises regularly.  A detailed review of systems today was entirely benign   COVID 19 VACCINATION STATUS: fully vaccinated [employer]), with booster 10/2019   HISTORY OF CURRENT ILLNESS: From the original intake note:  Amber [name] has a history of bilateral breast biopsies. 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 In 08/2017, a left breast biopsy showed only fibrocystic change.  She had routine screening mammography on 10/28/2019 showing a possible abnormality in the right breast. 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  Accordingly on 11/20/2019 she proceeded to biopsy of the right breast area in question. The pathology from this procedure ([id number]) showed: invasive ductal carcinoma, grade 1; ductal carcinoma in situ. The prognostic panel results were reported at conference this morning as showing estrogen receptor 95% positive with strong staining, progesterone receptor 50% positive with strong staining, HER-2 not amplified by immunohistochemistry, and MIB-1 of 2%  The patient's subsequent history is as detailed below.   PAST MEDICAL HISTORY: Past Medical History:  Diagnosis Date   Deviated septum    History of radiation therapy 02/09/2020-03/09/2020   Right Breast; Dr. Gery Pray   Malignant neoplasm of upper-outer quadrant of right breast in female, estrogen receptor positive (Amber [name]) 11/24/2019   Personal history of radiation therapy 2022   Sleep apnea    Sleep apnea; on CPAP   PAST SURGICAL HISTORY: Past Surgical History:  Procedure Laterality Date   BREAST BIOPSY Left 2019   BREAST BIOPSY Right 11/20/2019   BREAST BIOPSY Left 12/08/2019   BREAST LUMPECTOMY Right 01/02/2020   BREAST LUMPECTOMY WITH RADIOACTIVE SEED AND SENTINEL LYMPH NODE BIOPSY Right 01/02/2020   Procedure: RIGHT BREAST LUMPECTOMY WITH RADIOACTIVE SEED AND RIGHT AXILLARY SENTINEL  LYMPH NODE BIOPSY;  Surgeon: Rolm Bookbinder, MD;  Location: [facility];  Service: General;   Laterality: Right;  PEC BLOCK   DILATION AND EVACUATION  07/14/2001   miscarriage   ENDOMETRIAL ABLATION W/ NOVASURE      FAMILY HISTORY: Family History  Problem Relation Age of Onset   Breast cancer Paternal Aunt        dx after 73, triple negative   Arthritis Mother    GER disease Mother    Arthritis Sister        psoriatic arthritis, colits (collagenous)   Lung cancer Maternal Grandfather 66  The patient's father died in an accident in his 84s.  The patient's mother is 68 years old as of October 2021.  The patient has 1 sister, no brothers.  On the paternal side there is an aunt with possible cancer but the patient has very little data.  On the maternal side the patient's mother's father died from lung cancer in the setting of tobacco abuse   GYNECOLOGIC HISTORY:  No LMP recorded. Menarche: 50 years old Age at first live birth: 50 years old [place] P 3 LMP status post endometrial ablation Contraceptive husband is status post vasectomy HRT no  Hysterectomy?  No BSO?  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  Her husband Sherren Mocha works in [employer] for [employer].  Their children are 17, 15 and 14.  Her 66 year old daughter is already committed to [facility] and has a Consulting civil engineer.  The patient attends [employer] in [place]: In the absence of any documentation to the contrary, the patient's spouse is their HCPOA.    HEALTH MAINTENANCE: Social History   Tobacco Use   Smoking status: Never   Smokeless tobacco: Never  Vaping Use   Vaping Use: Never used  Substance Use Topics   Alcohol use: Not Currently   Drug use: Never     Colonoscopy: n/a (age)  PAP: Up-to-date  Bone density: Never   No Known Allergies  Current Outpatient Medications  Medication Sig Dispense Refill   tamoxifen (NOLVADEX) 20 MG tablet TAKE 1 TABLET BY MOUTH DAILY 90 tablet 4   zolpidem (AMBIEN) 5 MG tablet  TAKE 1 TABLET BY MOUTH AT BEDTIME AS NEEDED FOR SLEEP 30 tablet 1   No current facility-administered medications for this visit.    OBJECTIVE: White woman who appears younger than stated age  34:   08/17/20 1310  BP: (!) 102/57  Pulse: 70  Resp: 18  Temp: 97.7 F (36.5 C)  SpO2: 100%     Body mass index is 20.74 kg/m.   Wt Readings from Last 3 Encounters:  08/17/20 136 lb 6.4 oz (61.9 kg)  04/12/20 142 lb 6 oz (64.6 kg)  03/02/20 143 lb 11.2 oz (65.2 kg)      ECOG FS:1 - Symptomatic but completely ambulatory  Sclerae unicteric, EOMs intact Wearing a mask No cervical or supraclavicular adenopathy Lungs no rales or rhonchi Heart regular rate and rhythm Abd soft, nontender, positive bowel sounds MSK no focal spinal tenderness, no upper extremity lymphedema Neuro: nonfocal, well oriented, appropriate affect Breasts: The right breast is status post lumpectomy and radiation.  The cosmetic result is excellent.  There is no evidence of residual recurrent disease.  The left breast and both axillae are benign.   LAB RESULTS:  CMP     Component Value Date/Time   NA 140  11/26/2019 1547   K 3.8 11/26/2019 1547   CL 106 11/26/2019 1547   CO2 29 11/26/2019 1547   GLUCOSE 91 11/26/2019 1547   BUN 11 11/26/2019 1547   CREATININE 0.79 11/26/2019 1547   CREATININE 0.84 11/14/2018 0944   CALCIUM 9.3 11/26/2019 1547   PROT 6.6 11/26/2019 1547   ALBUMIN 3.8 11/26/2019 1547   AST 17 11/26/2019 1547   ALT 11 11/26/2019 1547   ALKPHOS 41 11/26/2019 1547   BILITOT 0.4 11/26/2019 1547   GFRNONAA >60 11/26/2019 1547   GFRNONAA 83 11/14/2018 0944   GFRAA 96 11/14/2018 0944    No results found for: TOTALPROTELP, ALBUMINELP, A1GS, A2GS, BETS, BETA2SER, GAMS, MSPIKE, SPEI  Lab Results  Component Value Date   WBC 5.0 08/17/2020   NEUTROABS 3.5 08/17/2020   HGB 12.2 08/17/2020   HCT 35.9 (L) 08/17/2020   MCV 89.1 08/17/2020   PLT 173 08/17/2020    No results found for:  LABCA2  No components found for: ERDEYC144  No results for input(s): INR in the last 168 hours.  No results found for: LABCA2  No results found for: YJE563  No results found for: JSH702  No results found for: OVZ858  No results found for: CA2729  No components found for: HGQUANT  No results found for: CEA1 / No results found for: CEA1   No results found for: AFPTUMOR  No results found for: CHROMOGRNA  No results found for: KPAFRELGTCHN, LAMBDASER, KAPLAMBRATIO (kappa/lambda light chains)  No results found for: HGBA, HGBA2QUANT, HGBFQUANT, HGBSQUAN (Hemoglobinopathy evaluation)   No results found for: LDH  Lab Results  Component Value Date   IRON 93 01/26/2020   (Iron and TIBC)  Lab Results  Component Value Date   FERRITIN 94.8 01/26/2020    Urinalysis    Component Value Date/Time   COLORURINE YELLOW 06/17/2018 1540   APPEARANCEUR CLEAR 06/17/2018 1540   LABSPEC <=1.005 (A) 06/17/2018 1540   PHURINE 6.5 06/17/2018 1540   GLUCOSEU NEGATIVE 06/17/2018 [place] 06/17/2018 1540   BILIRUBINUR NEGATIVE 06/17/2018 1540   KETONESUR NEGATIVE 06/17/2018 1540   UROBILINOGEN 0.2 06/17/2018 1540   NITRITE NEGATIVE 06/17/2018 [place] 06/17/2018 1540    STUDIES: No results found.   ELIGIBLE FOR AVAILABLE RESEARCH PROTOCOL: AET  ASSESSMENT: 50 y.o. [facility] woman status post right breast upper outer quadrant biopsy 11/20/2019 for a clinical T1c N0, stage IA invasive ductal carcinoma, grade 1, estrogen and progesterone receptor positive, HER-2 not amplified, with an MIB-1 of 2%  (1) genetics testing 12/04/2019 through the [facility] Common Hereditary Cancers Panel found no deleterious mutations in APC, ATM, AXIN2, BARD1, BMPR1A, BRCA1, BRCA2, BRIP1, CDH1, CDK4, CDKN2A (p14ARF), CDKN2A (p16INK4a), CHEK2, CTNNA1, DICER1, EPCAM (Deletion/duplication testing only), GREM1 (promoter region deletion/duplication testing only), KIT, MEN1, MLH1,  MSH2, MSH3, MSH6, MUTYH, NBN, NF1, NHTL1, PALB2, PDGFRA, PMS2, POLD1, POLE, PTEN, RAD50, RAD51C, RAD51D, RNF43, SDHB, SDHC, SDHD, SMAD4, SMARCA4. STK11, TP53, TSC1, TSC2, and VHL.  The following genes were evaluated for sequence changes only: SDHA and HOXB13 c.251G>A variant only.  (a) Variant of uncertain significance in ATM at c.5081C>G (p.Ala1694Gly).   (2) status post right lumpectomy and sentinel lymph node sampling 01/02/2020 for a pT1b pN0, stage IA invasive ductal carcinoma, grade 1, with negative margins.  (3) Oncotype score of 16 predicts a risk of recurrence outside the breast in the next 9 years of 4% if the patient's only systemic therapy is antiestrogens for 5 years.  It  also predicts no benefit from chemotherapy.  (4) adjuvant radiation  02/09/2020 through 03/09/2020   Site: Right breast Technique: 3D Total Dose (Gy): 40.05/40.05 Dose per Fx (Gy): 2.67 Completed Fx: 15/15 Beam Energies: 6X   Site: Right breast boost Technique: specialPort Total Dose (Gy): 10/10 Dose per Fx (Gy): 2 Completed Fx: 5/5 Beam Energies: 6E, 9E  (5) started tamoxifen 04/13/2020  (a) bone density 11/30/2020  (b) FSH and estradiol level 08/17/2020   PLAN: Adalin is tolerating tamoxifen with essentially no side effects.  This is the usual pattern in premenopausal women so I suspect her [facility] and estradiol today will confirm that she is premenopausal.  She is due for bone density in October.  She understands that tamoxifen actually helps the bone density so I do not anticipate any treatment changes based on that.  She will have mammography in November.  If she sees her surgeon in January or so then she can see [place] in July and we can continue that pattern yearly until she completes her 5 years of follow-up  Total encounter time 20 minutes.Sarajane Jews C. Danial Sisley, MD 08/17/2020 1:31 PM Medical Oncology and Hematology [facility] [address] [place], [place] [address] Tel. [phone]     Fax. [phone]   This document serves as a record of services personally performed by Lurline Del, MD. It was created on his behalf by Wilburn Mylar, a trained medical scribe. The creation of this record is based on the scribe's personal observations and the provider's statements to them.   I, Lurline Del MD, have reviewed the above documentation for accuracy and completeness, and I agree with the above.   *Total Encounter Time as defined by the Centers for Medicare and Medicaid Services includes, in addition to the face-to-face time of a patient visit (documented in the note above) non-face-to-face time: obtaining and reviewing outside history, ordering and reviewing medications, tests or procedures, care coordination (communications with other health care professionals or caregivers) and documentation in the medical record.

## 2020-08-17 NOTE — Telephone Encounter (Signed)
Dr. Chase Caller, Patient is traveling out of the country and would like a prescription for paxlovid to take with her should she need it during her trip.

## 2020-08-18 ENCOUNTER — Other Ambulatory Visit (HOSPITAL_COMMUNITY): Payer: Self-pay

## 2020-08-18 LAB — FOLLICLE STIMULATING HORMONE: FSH: 8.2 m[IU]/mL

## 2020-08-18 MED ORDER — NIRMATRELVIR/RITONAVIR (PAXLOVID)TABLET
3.0000 | ORAL_TABLET | Freq: Two times a day (BID) | ORAL | 0 refills | Status: AC
  Filled 2020-08-18: qty 30, 5d supply, fill #0

## 2020-08-18 NOTE — Telephone Encounter (Signed)
Patient made aware of medication being sent to her pharmacy.  She verbalized understanding.  Nothing further needed.

## 2020-08-20 LAB — ESTRADIOL, ULTRA SENS: Estradiol, Sensitive: 122.3 pg/mL

## 2020-08-23 ENCOUNTER — Other Ambulatory Visit (HOSPITAL_COMMUNITY): Payer: Self-pay

## 2020-09-15 NOTE — Progress Notes (Signed)
SURVIVORSHIP VIRTUAL VISIT:  I connected with Amber [name] on 09/15/20 at  8:30 AM EDT by my chart video and verified that I am speaking with the correct person using two identifiers.  I discussed the limitations, risks, security and privacy concerns of performing an evaluation and management service virtually and the availability of in person appointments. I also discussed with the patient that there may be a patient responsible charge related to this service. The patient expressed understanding and agreed to proceed.   Patient location: car Provider location: [facility] office Others participating in the call: none  BRIEF ONCOLOGIC HISTORY:  Oncology History  Malignant neoplasm of upper-outer quadrant of right breast in female, estrogen receptor positive ([place])  11/20/2019 Cancer Staging   Staging form: Breast, AJCC 8th Edition - Clinical stage from 11/20/2019: Stage IA (cT1c, cN0, cM0, G1, ER+, PR+, HER2-) - Signed by Gardenia Phlegm, NP on 11/26/2019    11/20/2019 Initial Diagnosis   status post right breast upper outer quadrant biopsy 11/20/2019 for a clinical T1c N0, stage IA invasive ductal carcinoma, grade 1, estrogen and progesterone receptor positive, HER-2 not amplified, with an MIB-1 of 2%    Genetic Testing   Negative genetic testing: no pathogenic variants detected in [employer] Common Hereditary Cancers Panel.  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  The report date is December 04, 2019.   The Common Hereditary Cancers Panel offered by [facility] includes sequencing and/or deletion duplication testing of the following 48 genes: APC, ATM, AXIN2, BARD1, BMPR1A, BRCA1, BRCA2, BRIP1, CDH1, CDK4, CDKN2A (p14ARF), CDKN2A (p16INK4a), CHEK2, CTNNA1, DICER1, EPCAM (Deletion/duplication testing only), GREM1 (promoter region deletion/duplication testing only), KIT, MEN1, MLH1, MSH2, MSH3, MSH6, MUTYH, NBN, NF1, NHTL1, PALB2, PDGFRA, PMS2, POLD1, POLE, PTEN, RAD50,  RAD51C, RAD51D, RNF43, SDHB, SDHC, SDHD, SMAD4, SMARCA4. STK11, TP53, TSC1, TSC2, and VHL.  The following genes were evaluated for sequence changes only: SDHA and HOXB13 c.251G>A variant only.   01/02/2020 Surgery    status post right lumpectomy and sentinel lymph node sampling a pT1b pN0, stage IA invasive ductal carcinoma, grade 1, with negative margins; 5 SLN negative   01/02/2020 Oncotype testing   Oncotype score of 16 predicts a risk of recurrence outside the breast in the next 9 years of 4% if the patient's only systemic therapy is antiestrogens for 5 years.  It also predicts no benefit from chemotherapy.   01/02/2020 Cancer Staging   Staging form: Breast, AJCC 8th Edition - Pathologic stage from 01/02/2020: Stage IA (pT1b, pN0, cM0, G1, ER+, PR+, HER2-) - Signed by Gardenia Phlegm, NP on 09/07/2020  Stage prefix: Initial diagnosis  Histologic grading system: 3 grade system    02/09/2020 - 03/09/2020 Radiation Therapy     Site: Right breast Technique: 3D Total Dose (Gy): 40.05/40.05 Dose per Fx (Gy): 2.67 Completed Fx: 15/15 Beam Energies: 6X   Site: Right breast boost Technique: specialPort Total Dose (Gy): 10/10 Dose per Fx (Gy): 2 Completed Fx: 5/5 Beam Energies: 6E, 9E   04/13/2020 -  Anti-estrogen oral therapy   Tamoxifen daily     INTERVAL HISTORY:  Amber [name] to review her survivorship care plan detailing her treatment course for breast cancer, as well as monitoring long-term side effects of that treatment, education regarding health maintenance, screening, and overall wellness and health promotion.     Overall, Amber [name] reports feeling quite well.  She continues on Tamoxifen with good tolerance.  She is not having any issues that she is aware.  She  noted significant fatigue following radiation which lasted for three months.  She notes that her activity level helps with that.  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    REVIEW OF  SYSTEMS:  Review of Systems  Constitutional:  Positive for fatigue. Negative for appetite change, chills, fever and unexpected weight change.  HENT:   Negative for hearing loss, lump/mass and trouble swallowing.   Eyes:  Negative for eye problems and icterus.  Respiratory:  Negative for chest tightness, cough and shortness of breath.   Cardiovascular:  Negative for chest pain, leg swelling and palpitations.  Gastrointestinal:  Negative for abdominal distention, abdominal pain, constipation, diarrhea, nausea and vomiting.  Endocrine: Negative for hot flashes.  Genitourinary:  Negative for difficulty urinating.   Musculoskeletal:  Negative for arthralgias.  Skin:  Negative for itching and rash.  Neurological:  Negative for dizziness, extremity weakness, headaches and numbness.  Hematological:  Negative for adenopathy. Does not bruise/bleed easily.  Psychiatric/Behavioral:  Negative for depression. The patient is not nervous/anxious.   Breast: Denies any new nodularity, masses, tenderness, nipple changes, or nipple discharge.    ONCOLOGY TREATMENT TEAM:  1. Surgeon:  Amber [name] at [facility] 2. Medical Oncologist: Amber [name] (Amber [name])  3. Radiation Oncologist: Amber [name]    PAST MEDICAL/SURGICAL HISTORY:  Past Medical History:  Diagnosis Date   Deviated septum    History of radiation therapy 02/09/2020-03/09/2020   Right Breast; Dr. Gery Pray   Malignant neoplasm of upper-outer quadrant of right breast in female, estrogen receptor positive ([place]) 11/24/2019   Personal history of radiation therapy 2022   Sleep apnea    Past Surgical History:  Procedure Laterality Date   BREAST BIOPSY Left 2019   BREAST BIOPSY Right 11/20/2019   BREAST BIOPSY Left 12/08/2019   BREAST LUMPECTOMY Right 01/02/2020   BREAST LUMPECTOMY WITH RADIOACTIVE SEED AND SENTINEL LYMPH NODE BIOPSY Right 01/02/2020   Procedure: RIGHT BREAST LUMPECTOMY WITH RADIOACTIVE SEED AND RIGHT AXILLARY  SENTINEL LYMPH NODE BIOPSY;  Surgeon: Amber Bookbinder, MD;  Location: [facility];  Service: General;  Laterality: Right;  PEC BLOCK   DILATION AND EVACUATION  07/14/2001   miscarriage   ENDOMETRIAL ABLATION W/ NOVASURE       ALLERGIES:  No Known Allergies   CURRENT MEDICATIONS:  Outpatient Encounter Medications as of 09/16/2020  Medication Sig   tamoxifen (NOLVADEX) 20 MG tablet TAKE 1 TABLET BY MOUTH DAILY   zolpidem (AMBIEN) 5 MG tablet TAKE 1 TABLET BY MOUTH AT BEDTIME AS NEEDED FOR SLEEP   No facility-administered encounter medications on file as of 09/16/2020.     ONCOLOGIC FAMILY HISTORY:  Family History  Problem Relation Age of Onset   Breast cancer Paternal Aunt        dx after 84, triple negative   Arthritis Mother    GER disease Mother    Arthritis Sister        psoriatic arthritis, colits (collagenous)   Lung cancer Maternal Grandfather 73     GENETIC COUNSELING/TESTING: See above  SOCIAL HISTORY:  Social History   Socioeconomic History   Marital status: Married    Spouse name: Not on file   Number of children: 3   Years of education: Not on file   Highest education level: Not on file  Occupational History   Occupation: Nurse P.  Tobacco Use   Smoking status: Never   Smokeless tobacco: Never  Vaping Use  Vaping Use: Never used  Substance and Sexual Activity   Alcohol use: Not Currently   Drug use: Never   Sexual activity: Yes    Partners: Male  Other Topics Concern   Not on file  Social History Narrative   Not on file   Social Determinants of Health   Financial Resource Strain: Not on file  Food Insecurity: Not on file  Transportation Needs: Not on file  Physical Activity: Not on file  Stress: Not on file  Social Connections: Not on file  Intimate Partner Violence: Not on file     OBSERVATIONS/OBJECTIVE:  Patient appears well.  Skin visualized is normal and without rash or lesion, breathing is non labored, mood and  behavior are normal.    LABORATORY DATA:  None for this visit.  DIAGNOSTIC IMAGING:  None for this visit.   ASSESSMENT AND PLAN:  Ms.. [name] is a pleasant 50 y.o. female with Stage IA right breast invasive ductal carcinoma, ER+/PR+/HER2-, diagnosed in 11/2019, treated with lumpectomy, adjuvant radiation therapy, and anti-estrogen therapy with Tamoxifen beginning in 04/2019.  She presents to the Survivorship Clinic for our initial meeting and routine follow-up post-completion of treatment for breast cancer.    1. Stage IA right breast cancer:  Amber [name] is continuing to recover from definitive treatment for breast cancer. She will follow-up with her medical oncologist, Amber [name] in 6 months and Amber [name] in on eyear with history and physical exam per surveillance protocol.  She will continue her anti-estrogen therapy with Tamoxifen. Thus far, she is tolerating the Tamoxifen well, with minimal side effects. She was instructed to make Dr. Lindi [name] or myself aware if she begins to experience any worsening side effects of the medication and I could see her back in clinic to help manage those side effects, as needed. Her mammogram is due 12/2020; orders placed today. Today, a comprehensive survivorship care plan and treatment summary was reviewed with the patient today detailing her breast cancer diagnosis, treatment course, potential late/long-term effects of treatment, appropriate follow-up care with recommendations for the future, and patient education resources.  A copy of this summary, along with a letter will be sent to the patient's primary care provider via mail/fax/In Basket message after today's visit.    2. Bone health:  She has bone density testing scheduled in October of this year. She was given education on specific activities to promote bone health.  3. Cancer screening:  Due to Ms. Farro's history and her age, she should receive screening for skin cancers, colon cancer, and  gynecologic cancers.  The information and recommendations are listed on the patient's comprehensive care plan/treatment summary and were reviewed in detail with the patient.    4. Health maintenance and wellness promotion: Ms. Finfrock was encouraged to consume 5-7 servings of fruits and vegetables per day. We reviewed the "Nutrition Rainbow" handout, as well as the handout "Take Control of Your Health and Reduce Your Cancer Risk" from the [facility].  She was also encouraged to engage in moderate to vigorous exercise for 30 minutes per day most days of the week. We discussed the [employer] fitness program, which is designed for cancer survivors to help them become more physically fit after cancer treatments.  She was instructed to limit her alcohol consumption and continue to abstain from tobacco.     5. Support services/counseling: It is not uncommon for this period of the patient's cancer care trajectory to be one of many emotions and stressors.  We discussed how this can be increasingly difficult during the times of quarantine and social distancing due to the COVID-19 pandemic.   She was given information regarding our available services and encouraged to contact me with any questions or for help enrolling in any of our support group/programs.    Follow up instructions:    -Return to cancer center in 08/2021 for f/u with Amber [name]  -Mammogram due in 12/2020 -Bone density in 11/2020 -Follow up with Amber [name] in 02/2021 -She is welcome to return back to the Survivorship Clinic at any time; no additional follow-up needed at this time.  -Consider referral back to survivorship as a long-term survivor for continued surveillance  The patient was provided an opportunity to ask questions and all were answered. The patient agreed with the plan and demonstrated an understanding of the instructions.   Total encounter time: 30 minutes in face to face visit time via video visit, and in SCP  preparation, care coordination, chart review, and documentation of the encounter.   Wilber Bihari, NP 09/15/20 10:07 AM Medical Oncology and Hematology [facility] [place], [place] [address] Tel. [phone]    Fax. [phone]  *Total Encounter Time as defined by the Centers for Medicare and Medicaid Services includes, in addition to the face-to-face time of a patient visit (documented in the note above) non-face-to-face time: obtaining and reviewing outside history, ordering and reviewing medications, tests or procedures, care coordination (communications with other health care professionals or caregivers) and documentation in the medical record.

## 2020-09-16 ENCOUNTER — Encounter: Payer: Self-pay | Admitting: Adult Health

## 2020-09-16 ENCOUNTER — Ambulatory Visit (HOSPITAL_BASED_OUTPATIENT_CLINIC_OR_DEPARTMENT_OTHER): Payer: 59 | Admitting: Adult Health

## 2020-09-16 DIAGNOSIS — C50411 Malignant neoplasm of upper-outer quadrant of right female breast: Secondary | ICD-10-CM

## 2020-09-16 DIAGNOSIS — Z170 Estrogen receptor positive status [ER+]: Secondary | ICD-10-CM | POA: Diagnosis not present

## 2020-09-29 DIAGNOSIS — G4733 Obstructive sleep apnea (adult) (pediatric): Secondary | ICD-10-CM | POA: Diagnosis not present

## 2020-10-25 DIAGNOSIS — H5213 Myopia, bilateral: Secondary | ICD-10-CM | POA: Diagnosis not present

## 2020-10-25 DIAGNOSIS — H524 Presbyopia: Secondary | ICD-10-CM | POA: Diagnosis not present

## 2020-10-27 DIAGNOSIS — Z6820 Body mass index (BMI) 20.0-20.9, adult: Secondary | ICD-10-CM | POA: Diagnosis not present

## 2020-10-27 DIAGNOSIS — Z01419 Encounter for gynecological examination (general) (routine) without abnormal findings: Secondary | ICD-10-CM | POA: Diagnosis not present

## 2020-10-27 DIAGNOSIS — Z1389 Encounter for screening for other disorder: Secondary | ICD-10-CM | POA: Diagnosis not present

## 2020-10-27 DIAGNOSIS — Z1151 Encounter for screening for human papillomavirus (HPV): Secondary | ICD-10-CM | POA: Diagnosis not present

## 2020-10-27 DIAGNOSIS — Z124 Encounter for screening for malignant neoplasm of cervix: Secondary | ICD-10-CM | POA: Diagnosis not present

## 2020-10-27 DIAGNOSIS — Z130 Encounter for screening for diseases of the blood and blood-forming organs and certain disorders involving the immune mechanism: Secondary | ICD-10-CM | POA: Diagnosis not present

## 2020-10-28 DIAGNOSIS — Z1151 Encounter for screening for human papillomavirus (HPV): Secondary | ICD-10-CM | POA: Diagnosis not present

## 2020-10-28 DIAGNOSIS — Z124 Encounter for screening for malignant neoplasm of cervix: Secondary | ICD-10-CM | POA: Diagnosis not present

## 2020-10-28 LAB — HM PAP SMEAR

## 2020-11-19 ENCOUNTER — Other Ambulatory Visit (HOSPITAL_COMMUNITY): Payer: Self-pay

## 2020-11-19 MED FILL — Tamoxifen Citrate Tab 20 MG (Base Equivalent): ORAL | 90 days supply | Qty: 90 | Fill #1 | Status: AC

## 2020-11-24 ENCOUNTER — Encounter: Payer: Self-pay | Admitting: Internal Medicine

## 2020-11-26 DIAGNOSIS — Z853 Personal history of malignant neoplasm of breast: Secondary | ICD-10-CM | POA: Diagnosis not present

## 2020-11-26 DIAGNOSIS — R140 Abdominal distension (gaseous): Secondary | ICD-10-CM | POA: Diagnosis not present

## 2020-11-30 ENCOUNTER — Other Ambulatory Visit: Payer: Self-pay

## 2020-11-30 ENCOUNTER — Ambulatory Visit
Admission: RE | Admit: 2020-11-30 | Discharge: 2020-11-30 | Disposition: A | Discharge status: Home / Self Care | Payer: 59 | Source: Ambulatory Visit | Attending: Oncology | Admitting: Oncology

## 2020-11-30 DIAGNOSIS — Z0389 Encounter for observation for other suspected diseases and conditions ruled out: Secondary | ICD-10-CM | POA: Diagnosis not present

## 2020-11-30 DIAGNOSIS — C50411 Malignant neoplasm of upper-outer quadrant of right female breast: Secondary | ICD-10-CM

## 2020-11-30 DIAGNOSIS — Z170 Estrogen receptor positive status [ER+]: Secondary | ICD-10-CM

## 2020-12-29 ENCOUNTER — Other Ambulatory Visit (HOSPITAL_COMMUNITY): Payer: Self-pay

## 2020-12-29 ENCOUNTER — Encounter: Payer: Self-pay | Admitting: Internal Medicine

## 2020-12-29 ENCOUNTER — Ambulatory Visit (AMBULATORY_SURGERY_CENTER): Payer: 59

## 2020-12-29 ENCOUNTER — Other Ambulatory Visit: Payer: Self-pay

## 2020-12-29 VITALS — Ht 68.0 in | Wt 140.0 lb

## 2020-12-29 DIAGNOSIS — Z1211 Encounter for screening for malignant neoplasm of colon: Secondary | ICD-10-CM

## 2020-12-29 MED ORDER — NA SULFATE-K SULFATE-MG SULF 17.5-3.13-1.6 GM/177ML PO SOLN
1.0000 | Freq: Once | ORAL | 0 refills | Status: AC
  Filled 2020-12-29 – 2021-01-14 (×2): qty 354, 1d supply, fill #0

## 2020-12-29 NOTE — Progress Notes (Signed)
Pre visit completed via phone call; Patient verified name, DOB, and address; No egg or soy allergy known to patient  Known to pt with past sedation with any surgeries or procedures---patient reports she does have hypotension and nausea with anesthesia- treated with increased IV fluids and anti nausea medication Patient denies ever being told they had issues or difficulty with intubation  No FH of Malignant Hyperthermia Pt is not on diet pills Pt is not on home 02  Pt is not on blood thinners  Pt denies issues with constipation  No A fib or A flutter Pt is fully vaccinated for Covid x 2 + booster; NO PA's for preps discussed with pt in PV today  Discussed with pt there will be an out-of-pocket cost for prep and that varies from $0 to 70 +  dollars - pt verbalized understanding  Due to the COVID-19 pandemic we are asking patients to follow certain guidelines in PV and the [facility]   Pt aware of COVID protocols and [facility] guidelines

## 2021-01-07 ENCOUNTER — Other Ambulatory Visit (HOSPITAL_COMMUNITY): Payer: Self-pay

## 2021-01-10 ENCOUNTER — Other Ambulatory Visit: Payer: Self-pay

## 2021-01-10 ENCOUNTER — Ambulatory Visit
Admission: RE | Admit: 2021-01-10 | Discharge: 2021-01-10 | Disposition: A | Discharge status: Home / Self Care | Payer: 59 | Source: Ambulatory Visit | Attending: Oncology | Admitting: Oncology

## 2021-01-10 DIAGNOSIS — G4733 Obstructive sleep apnea (adult) (pediatric): Secondary | ICD-10-CM

## 2021-01-10 DIAGNOSIS — Z170 Estrogen receptor positive status [ER+]: Secondary | ICD-10-CM

## 2021-01-10 DIAGNOSIS — R922 Inconclusive mammogram: Secondary | ICD-10-CM | POA: Diagnosis not present

## 2021-01-10 DIAGNOSIS — Z853 Personal history of malignant neoplasm of breast: Secondary | ICD-10-CM | POA: Diagnosis not present

## 2021-01-12 ENCOUNTER — Encounter: Payer: 59 | Admitting: Internal Medicine

## 2021-01-14 ENCOUNTER — Other Ambulatory Visit (HOSPITAL_COMMUNITY): Payer: Self-pay

## 2021-01-14 DIAGNOSIS — C50411 Malignant neoplasm of upper-outer quadrant of right female breast: Secondary | ICD-10-CM | POA: Diagnosis not present

## 2021-01-14 DIAGNOSIS — Z170 Estrogen receptor positive status [ER+]: Secondary | ICD-10-CM | POA: Diagnosis not present

## 2021-01-17 ENCOUNTER — Other Ambulatory Visit (HOSPITAL_COMMUNITY): Payer: Self-pay

## 2021-01-19 ENCOUNTER — Other Ambulatory Visit: Payer: Self-pay

## 2021-01-19 ENCOUNTER — Ambulatory Visit (AMBULATORY_SURGERY_CENTER): Payer: 59 | Admitting: Internal Medicine

## 2021-01-19 ENCOUNTER — Encounter: Payer: Self-pay | Admitting: Internal Medicine

## 2021-01-19 VITALS — BP 144/77 | HR 76 | Temp 98.8°F | Resp 14 | Ht 68.0 in | Wt 140.0 lb

## 2021-01-19 DIAGNOSIS — D123 Benign neoplasm of transverse colon: Secondary | ICD-10-CM

## 2021-01-19 DIAGNOSIS — K635 Polyp of colon: Secondary | ICD-10-CM | POA: Diagnosis not present

## 2021-01-19 DIAGNOSIS — Z1211 Encounter for screening for malignant neoplasm of colon: Secondary | ICD-10-CM | POA: Diagnosis not present

## 2021-01-19 MED ORDER — SODIUM CHLORIDE 0.9 % IV SOLN: 500.0000 mL | Freq: Once | INTRAVENOUS | Status: DC

## 2021-01-19 NOTE — Progress Notes (Signed)
Called to room to assist during endoscopic procedure.  Patient ID and intended procedure confirmed with present staff. Received instructions for my participation in the procedure from the performing physician.  

## 2021-01-19 NOTE — Progress Notes (Signed)
To pacu, VSS. Report to [email or web address] 

## 2021-01-19 NOTE — Patient Instructions (Signed)
YOU HAD AN ENDOSCOPIC PROCEDURE TODAY AT THE [facility]:   Refer to the procedure report that was given to you for any specific questions about what was found during the examination.  If the procedure report does not answer your questions, please call your gastroenterologist to clarify.  If you requested that your care partner not be given the details of your procedure findings, then the procedure report has been included in a sealed envelope for you to review at your convenience later.  YOU SHOULD EXPECT: Some feelings of bloating in the abdomen. Passage of more gas than usual.  Walking can help get rid of the air that was put into your GI tract during the procedure and reduce the bloating. If you had a lower endoscopy (such as a colonoscopy or flexible sigmoidoscopy) you may notice spotting of blood in your stool or on the toilet paper. If you underwent a bowel prep for your procedure, you may not have a normal bowel movement for a few days.  Please Note:  You might notice some irritation and congestion in your nose or some drainage.  This is from the oxygen used during your procedure.  There is no need for concern and it should clear up in a day or so.  SYMPTOMS TO REPORT IMMEDIATELY:   Following lower endoscopy (colonoscopy or flexible sigmoidoscopy):  Excessive amounts of blood in the stool  Significant tenderness or worsening of abdominal pains  Swelling of the abdomen that is new, acute  Fever of 100F or higher  For urgent or emergent issues, a gastroenterologist can be reached at any hour by calling [phone]. Do not use MyChart messaging for urgent concerns.    DIET:  We do recommend a small meal at first, but then you may proceed to your regular diet.  Drink plenty of fluids but you should avoid alcoholic beverages for 24 hours.  ACTIVITY:  You should plan to take it easy for the rest of today and you should NOT DRIVE or use heavy machinery until tomorrow (because  of the sedation medicines used during the test).    FOLLOW UP: Our staff will call the number listed on your records 48-72 hours following your procedure to check on you and address any questions or concerns that you may have regarding the information given to you following your procedure. If we do not reach you, we will leave a message.  We will attempt to reach you two times.  During this call, we will ask if you have developed any symptoms of COVID 19. If you develop any symptoms (ie: fever, flu-like symptoms, shortness of breath, cough etc.) before then, please call [phone].  If you test positive for Covid 19 in the 2 weeks post procedure, please call and report this information to us.    If any biopsies were taken you will be contacted by phone or by letter within the next 1-3 weeks.  Please call us at [phone] if you have not heard about the biopsies in 3 weeks.    SIGNATURES/CONFIDENTIALITY: You and/or your care partner have signed paperwork which will be entered into your electronic medical record.  These signatures attest to the fact that that the information above on your After Visit Summary has been reviewed and is understood.  Full responsibility of the confidentiality of this discharge information lies with you and/or your care-partner. 

## 2021-01-19 NOTE — Progress Notes (Signed)
GASTROENTEROLOGY PROCEDURE H&P NOTE   Primary Care Physician: Debbrah Alar, NP    Reason for Procedure:   Colon cancer screening  Plan:    Colonoscopy  Patient is appropriate for endoscopic procedure(s) in the ambulatory ([place]) setting.  The nature of the procedure, as well as the risks, benefits, and alternatives were carefully and thoroughly reviewed with the patient. Ample time for discussion and questions allowed. The patient understood, was satisfied, and agreed to proceed.     HPI: Amber [name] is a 50 y.o. female who presents for colonoscopy for colon cancer screening. This is her first colonoscopy. Denies blood in stools, changes in bowel habits, or weight loss. Denies fam hx of colon cancer. Her sister and mother have had colon polyps, unclear what kind. Denies use of blood thinners.  Past Medical History:  Diagnosis Date   Breast cancer ([place]) 2021   RIGHT   Deviated septum    Extensor tendinitis of foot 07/31/2013   First toe   Malignant neoplasm of upper-outer quadrant of right breast in female, estrogen receptor positive ([place]) 11/24/2019   Personal history of radiation therapy 2022   Sleep apnea    mild sleep apnea- uses CPAP nightly    Past Surgical History:  Procedure Laterality Date   BREAST BIOPSY Left 2019   BREAST BIOPSY Right 11/20/2019   BREAST BIOPSY Left 12/08/2019   BREAST LUMPECTOMY WITH RADIOACTIVE SEED AND SENTINEL LYMPH NODE BIOPSY Right 01/02/2020   Procedure: RIGHT BREAST LUMPECTOMY WITH RADIOACTIVE SEED AND RIGHT AXILLARY SENTINEL LYMPH NODE BIOPSY;  Surgeon: Rolm Bookbinder, MD;  Location: [facility];  Service: General;  Laterality: Right;  [place]  07/14/2001   miscarriage   ENDOMETRIAL ABLATION W/ NOVASURE  2010    Prior to Admission medications   Medication Sig Start Date End Date Taking? Authorizing Provider  tamoxifen (NOLVADEX) 20 MG tablet TAKE 1 TABLET BY MOUTH DAILY  04/16/20 04/16/21 Yes Magrinat, Virgie Dad, MD  zolpidem (AMBIEN) 5 MG tablet TAKE 1 TABLET BY MOUTH AT BEDTIME AS NEEDED FOR SLEEP 02/02/20 12/29/20  Martyn Ehrich, NP    Current Outpatient Medications  Medication Sig Dispense Refill   tamoxifen (NOLVADEX) 20 MG tablet TAKE 1 TABLET BY MOUTH DAILY 90 tablet 4   zolpidem (AMBIEN) 5 MG tablet TAKE 1 TABLET BY MOUTH AT BEDTIME AS NEEDED FOR SLEEP 30 tablet 1   Current Facility-Administered Medications  Medication Dose Route Frequency Provider Last Rate Last Admin   0.9 %  sodium chloride infusion  500 mL Intravenous Once Sharyn Creamer, MD        Allergies as of 01/19/2021   (No Known Allergies)    Family History  Problem Relation Age of Onset   Colon polyps Mother 23   Arthritis Mother    GER disease Mother    Arthritis Sister        psoriatic arthritis, colits (collagenous)   Breast cancer Paternal Aunt        dx after 57, triple negative   Lung cancer Maternal Grandfather 73   Colon cancer Neg Hx    Esophageal cancer Neg Hx    Rectal cancer Neg Hx    Stomach cancer Neg Hx     Social History   Socioeconomic History   Marital status: Married    Spouse name: Not on file   Number of children: 3   Years of education: Not on file   Highest education level:  Not on file  Occupational History   Occupation: Nurse P.  Tobacco Use   Smoking status: Never   Smokeless tobacco: Never  Vaping Use   Vaping Use: Never used  Substance and Sexual Activity   Alcohol use: Not Currently   Drug use: Never   Sexual activity: Yes    Partners: Male  Other Topics Concern   Not on file  Social History Narrative   Not on file   Social Determinants of Health   Financial Resource Strain: Not on file  Food Insecurity: Not on file  Transportation Needs: Not on file  Physical Activity: Not on file  Stress: Not on file  Social Connections: Not on file  Intimate Partner Violence: Not on file    Physical Exam: Vital signs in last 24  hours: BP 131/77   Pulse 87   Temp 98.8 F (37.1 C)   Ht 5\' 8"  (1.727 m)   Wt 140 lb (63.5 kg)   LMP 01/04/2021 (Approximate)   SpO2 98%   BMI 21.29 kg/m  GEN: NAD EYE: Sclerae anicteric ENT: MMM CV: Non-tachycardic Pulm: No increased work of breathing GI: Soft, NT/ND NEURO:  Alert & Oriented   [name], MD [facility]  01/19/2021 2:11 PM

## 2021-01-19 NOTE — Op Note (Signed)
[facility] Patient Name: Amber [name] Procedure Date: 01/19/2021 2:16 PM MRN: [id number] Endoscopist: Sonny Masters "[name] ,  Age: 50 Referring MD:  Date of Birth: Jun 22, 1970 Gender: Female Account #: [id number] Procedure:                Colonoscopy Indications:              Screening for colorectal malignant neoplasm, This                            is the patient's first colonoscopy Medicines:                Monitored Anesthesia Care Procedure:                Pre-Anesthesia Assessment:                           - Prior to the procedure, a History and Physical                            was performed, and patient medications and                            allergies were reviewed. The patient's tolerance of                            previous anesthesia was also reviewed. The risks                            and benefits of the procedure and the sedation                            options and risks were discussed with the patient.                            All questions were answered, and informed consent                            was obtained. Prior Anticoagulants: The patient has                            taken no previous anticoagulant or antiplatelet                            agents. ASA Grade Assessment: II - A patient with                            mild systemic disease. After reviewing the risks                            and benefits, the patient was deemed in                            satisfactory condition to undergo the procedure.  After obtaining informed consent, the colonoscope                            was passed under direct vision. Throughout the                            procedure, the patient's blood pressure, pulse, and                            oxygen saturations were monitored continuously. The                            Olympus PCF-H190DL (#[id number]) Colonoscope was                            introduced through the  anus and advanced to the the                            terminal ileum. The colonoscopy was performed                            without difficulty. The patient tolerated the                            procedure well. The quality of the bowel                            preparation was good. The ileocecal valve,                            appendiceal orifice, and rectum were photographed. Scope In: 2:21:56 PM Scope Out: 2:45:56 PM Scope Withdrawal Time: 0 hours 17 minutes 51 seconds  Total Procedure Duration: 0 hours 24 minutes 0 seconds  Findings:                 A 3 mm polyp was found in the transverse colon. The                            polyp was sessile. The polyp was removed with a                            cold snare. Resection and retrieval were complete.                           Non-bleeding internal hemorrhoids were found during                            retroflexion. Complications:            No immediate complications. Estimated Blood Loss:     Estimated blood loss was minimal. Impression:               - One 3 mm polyp in the transverse colon, removed  with a cold snare. Resected and retrieved.                           - Non-bleeding internal hemorrhoids. Recommendation:           - Discharge patient to home (with escort).                           - Await pathology results.                           - The findings and recommendations were discussed                            with the patient. Sonny Masters "[name],  01/19/2021 2:51:09 PM

## 2021-01-21 ENCOUNTER — Telehealth: Payer: Self-pay

## 2021-01-21 NOTE — Telephone Encounter (Signed)
  Follow up Call-  Call back number 01/19/2021  Post procedure Call Back phone  # [phone]  Permission to leave phone message Yes  Some recent data might be hidden     Patient questions:  Do you have a fever, pain , or abdominal swelling? No. Pain Score  0 *  Have you tolerated food without any problems? Yes.    Have you been able to return to your normal activities? Yes.    Do you have any questions about your discharge instructions: Diet   No. Medications  No. Follow up visit  No.  Do you have questions or concerns about your Care? No.  Actions: * If pain score is 4 or above: No action needed, pain <4.  Have you developed a fever since your procedure? no  2.   Have you had an respiratory symptoms (SOB or cough) since your procedure? no  3.   Have you tested positive for COVID 19 since your procedure no  4.   Have you had any family members/close contacts diagnosed with the COVID 19 since your procedure?  no   If yes to any of these questions please route to Joylene John, RN and Joella Prince, RN

## 2021-01-26 ENCOUNTER — Ambulatory Visit (INDEPENDENT_AMBULATORY_CARE_PROVIDER_SITE_OTHER): Payer: 59 | Admitting: Family

## 2021-01-26 ENCOUNTER — Encounter: Payer: Self-pay | Admitting: Family

## 2021-01-26 ENCOUNTER — Other Ambulatory Visit (HOSPITAL_COMMUNITY): Payer: Self-pay

## 2021-01-26 ENCOUNTER — Encounter: Payer: Self-pay | Admitting: Internal Medicine

## 2021-01-26 VITALS — BP 97/61 | HR 73 | Temp 98.2°F | Resp 18 | Ht 68.0 in | Wt 137.6 lb

## 2021-01-26 DIAGNOSIS — Z0000 Encounter for general adult medical examination without abnormal findings: Secondary | ICD-10-CM

## 2021-01-26 DIAGNOSIS — F430 Acute stress reaction: Secondary | ICD-10-CM | POA: Insufficient documentation

## 2021-01-26 MED ORDER — ESCITALOPRAM OXALATE 5 MG PO TABS
5.0000 mg | ORAL_TABLET | Freq: Every day | ORAL | 1 refills | Status: DC
  Filled 2021-01-26 – 2021-03-01 (×2): qty 30, 30d supply, fill #0

## 2021-01-26 NOTE — Progress Notes (Signed)
Subjective:   By signing my name below, I, Amber [name], attest that this documentation has been prepared under the direction and in the presence of Amber [name]. 01/26/2021    Patient ID: Amber [name], female    DOB: Dec 12, 1970, 50 y.o.   MRN: [id number]  Chief Complaint  Patient presents with   Annual Exam    Doing well , no complaints     HPI Patient is in today for a comprehensive physical exam.   Tamoxifen- She continues taking 20 mg tamoxifen daily PO and reports having no hot flashes while taking them.   She denies having any unexpected weight change, ear pain, hearing loss and rhinorrhea, visual disturbance, cough, chest pain and leg swelling, nausea, vomiting, diarrhea, constipation, blood in stool, or dysuria and frequency, for myalgias and arthralgias, rash, headaches, adenopathy, depression or anxiety at this time. Social history: She has no recent changes to her family medical history. She does not drink alcohol. She does not use drugs. She does not use tobacco or vaping products. She has one female partner.  Immunizations: She is UTD on flu vaccines this year. She is UTD on tetanus vaccines. She did not receive the bivalent Covid-19 vaccine and is planning on receiving it at a later time from her pharmacy.  Diet: She is improving her diet.  Exercise: She is participating in regular exercise.  Colonoscopy: Last completed 01/19/2021. Results showed one 3 mm polyp in the transverse colon which was removed, non-bleeding internal hemorrhoids, otherwise results are normal.  Dexa: Last completed 11/30/2020. Results are normal.  Pap Smear: Last completed 09/17/2019.  Mammogram: Last completed 01/10/2021. Results showed stable right lumpectomy site, otherwise results are normal.  Dental: She is UTD on dental care. Vision: She is UTD on vision care.   Notes increased stress recently. Child left for college, work has been stressful, recent health issues.   Health  Maintenance Due  Topic Date Due   COVID-19 Vaccine (4 - Booster for Pfizer series) 01/02/2020    Past Medical History:  Diagnosis Date   Breast cancer ([place]) 2021   RIGHT   Deviated septum    Extensor tendinitis of foot 07/31/2013   First toe   Malignant neoplasm of upper-outer quadrant of right breast in female, estrogen receptor positive ([place]) 11/24/2019   Personal history of radiation therapy 2022   Sleep apnea    mild sleep apnea- uses CPAP nightly    Past Surgical History:  Procedure Laterality Date   BREAST BIOPSY Left 2019   BREAST BIOPSY Right 11/20/2019   BREAST BIOPSY Left 12/08/2019   BREAST LUMPECTOMY WITH RADIOACTIVE SEED AND SENTINEL LYMPH NODE BIOPSY Right 01/02/2020   Procedure: RIGHT BREAST LUMPECTOMY WITH RADIOACTIVE SEED AND RIGHT AXILLARY SENTINEL LYMPH NODE BIOPSY;  Surgeon: Rolm Bookbinder, MD;  Location: [facility];  Service: General;  Laterality: Right;  PEC BLOCK   DILATION AND EVACUATION  07/14/2001   miscarriage   ENDOMETRIAL ABLATION W/ NOVASURE  2010    Family History  Problem Relation Age of Onset   Colon polyps Mother 30   Arthritis Mother    GER disease Mother    Arthritis Sister        psoriatic arthritis, colits (collagenous)   Breast cancer Paternal Aunt        dx after 32, triple negative   Lung cancer Maternal Grandfather 58   Colon cancer Neg Hx    Esophageal cancer Neg Hx    Rectal cancer Neg  Hx    Stomach cancer Neg Hx     Social History   Socioeconomic History   Marital status: Married    Spouse name: Not on file   Number of children: 3   Years of education: Not on file   Highest education level: Not on file  Occupational History   Occupation: Nurse P.  Tobacco Use   Smoking status: Never   Smokeless tobacco: Never  Vaping Use   Vaping Use: Never used  Substance and Sexual Activity   Alcohol use: Not Currently   Drug use: Never   Sexual activity: Yes    Partners: Male  Other Topics Concern    Not on file  Social History Narrative   Not on file   Social Determinants of Health   Financial Resource Strain: Not on file  Food Insecurity: Not on file  Transportation Needs: Not on file  Physical Activity: Not on file  Stress: Not on file  Social Connections: Not on file  Intimate Partner Violence: Not on file    Outpatient Medications Prior to Visit  Medication Sig Dispense Refill   tamoxifen (NOLVADEX) 20 MG tablet TAKE 1 TABLET BY MOUTH DAILY 90 tablet 4   zolpidem (AMBIEN) 5 MG tablet TAKE 1 TABLET BY MOUTH AT BEDTIME AS NEEDED FOR SLEEP 30 tablet 1   No facility-administered medications prior to visit.    No Known Allergies  Review of Systems  Constitutional:        (-)unexpected weight change (-)Adenopathy  HENT:  Negative for hearing loss.        (-)Rhinorrhea   Eyes:        (-)Visual disturbance  Respiratory:  Negative for cough.   Cardiovascular:  Negative for chest pain and leg swelling.  Gastrointestinal:  Negative for blood in stool, constipation, diarrhea, nausea and vomiting.  Genitourinary:  Negative for dysuria and frequency.  Musculoskeletal:  Negative for joint pain and myalgias.  Skin:  Negative for rash.  Neurological:  Negative for headaches.  Psychiatric/Behavioral:  Negative for depression. The patient is not nervous/anxious.       Objective:    Physical Exam Constitutional:      General: She is not in acute distress.    Appearance: Normal appearance. She is not ill-appearing.  HENT:     Head: Normocephalic and atraumatic.     Right Ear: Tympanic membrane, ear canal and external ear normal.     Left Ear: Tympanic membrane, ear canal and external ear normal.     Mouth/Throat:     Pharynx: Oropharynx is clear. No posterior oropharyngeal erythema or uvula swelling.  Eyes:     Extraocular Movements: Extraocular movements intact.     Right eye: No nystagmus.     Left eye: No nystagmus.     Pupils: Pupils are equal, round, and reactive  to light.  Cardiovascular:     Rate and Rhythm: Normal rate and regular rhythm.     Heart sounds: Normal heart sounds. No murmur heard.   No gallop.  Pulmonary:     Effort: Pulmonary effort is normal. No respiratory distress.     Breath sounds: Normal breath sounds. No wheezing or rales.  Abdominal:     General: There is no distension.     Palpations: Abdomen is soft.     Tenderness: There is no abdominal tenderness. There is no guarding.  Musculoskeletal:     Comments: 5/5 strength in both upper and lower extremities  Lymphadenopathy:  Cervical: No cervical adenopathy.  Skin:    General: Skin is warm and dry.  Neurological:     Mental Status: She is alert and oriented to person, place, and time.     Deep Tendon Reflexes:     Reflex Scores:      Patellar reflexes are 2+ on the right side and 2+ on the left side. Psychiatric:        Behavior: Behavior normal.        Judgment: Judgment normal.    BP 97/61    Pulse 73    Temp 98.2 F (36.8 C)    Resp 18    Ht 5\' 8"  (1.727 m)    Wt 137 lb 9.6 oz (62.4 kg)    LMP 01/04/2021 (Approximate)    SpO2 100%    BMI 20.92 kg/m  Wt Readings from Last 3 Encounters:  01/26/21 137 lb 9.6 oz (62.4 kg)  01/19/21 140 lb (63.5 kg)  12/29/20 140 lb (63.5 kg)       Assessment & Plan:   Problem List Items Addressed This Visit       Unprioritized   Preventative health care    Continue healthy diet, exercise.  Pap, mammo and colo up to date. She plans to get the covid bivalent booster. Labs up to date.       Acute reaction to situational stress - Primary   Relevant Medications   escitalopram (LEXAPRO) 5 MG tablet     Meds ordered this encounter  Medications   escitalopram (LEXAPRO) 5 MG tablet    Sig: Take 1 tablet (5 mg total) by mouth daily.    Dispense:  30 tablet    Refill:  1    Order Specific Question:   Supervising Provider    Answer:   Penni Homans A [[id number]]    I, Amber [name], personally preformed the  services described in this documentation.  All medical record entries made by the scribe were at my direction and in my presence.  I have reviewed the chart and discharge instructions (if applicable) and agree that the record reflects my personal performance and is accurate and complete. 01/26/2021   I,Amber [name],acting as a Education administrator for Nance Pear, [name].,have documented all relevant documentation on the behalf of Nance Pear, [name],as directed by  Nance Pear, [name] while in the presence of Nance Pear, [name].   Nance Pear, [name]

## 2021-01-26 NOTE — Assessment & Plan Note (Signed)
Continue healthy diet, exercise.  Pap, mammo and colo up to date. She plans to get the covid bivalent booster. Labs up to date.

## 2021-02-01 DIAGNOSIS — G4733 Obstructive sleep apnea (adult) (pediatric): Secondary | ICD-10-CM | POA: Diagnosis not present

## 2021-02-03 ENCOUNTER — Other Ambulatory Visit (HOSPITAL_COMMUNITY): Payer: Self-pay

## 2021-02-11 ENCOUNTER — Ambulatory Visit (INDEPENDENT_AMBULATORY_CARE_PROVIDER_SITE_OTHER): Payer: 59 | Admitting: Primary Care

## 2021-02-11 ENCOUNTER — Other Ambulatory Visit: Payer: Self-pay

## 2021-02-11 ENCOUNTER — Encounter: Payer: Self-pay | Admitting: Primary Care

## 2021-02-11 DIAGNOSIS — G4733 Obstructive sleep apnea (adult) (pediatric): Secondary | ICD-10-CM | POA: Diagnosis not present

## 2021-02-11 DIAGNOSIS — G4700 Insomnia, unspecified: Secondary | ICD-10-CM | POA: Diagnosis not present

## 2021-02-11 NOTE — Assessment & Plan Note (Addendum)
-   HST 11/12/2017 showed moderate OSA, AHI 17/2/hr with SpO2 low 80%. Patient is 77% compliant with CPAP >4 hours and reports benefit from use. NO issues with mask fit or pressure setting. Current CPAP pressure 4-8cm h20 (6.5cm h20-95%); Residual AHI 0.2. No changes today. Continue to encourage patient consistently wear CPAP every night for min 4-6 hours or longer. Recommend she maintain healthy weight and advised against driving if experiencing excessive daytime fatigue. Follow-up annually.

## 2021-02-11 NOTE — Assessment & Plan Note (Addendum)
-   Patient experiences occasional insomnia symptoms that are well controlled with prn Ambien 5mg  daily. She has no residual daytime drowsiness. She takes sleep aid on average 2-3 times a month, she does not need a refill at today's visit.

## 2021-02-11 NOTE — Progress Notes (Signed)
@Patient  ID: Amber [name], female    DOB: [birth year], 50 y.o.   MRN: [id number]  No chief complaint on file.   Referring provider: Debbrah Alar, NP  HPI: 50 year old female, never smoked. PMH significant for OSA, malignant neoplasm right breast. Patient of Dr. Annamaria Boots, last seen by him on 11/13/17. HST on 11/12/17 showed moderate OSA, AHI 17.2 with SpO2 low 80%. Maintained on AUTO CPAP at 4-8cm h20.  Previous [facility] pulmonary encounter: 02/02/2020 Patient presents today for follow-up OSA on CPAP. 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 Donne Hazel which was negative for lymph involvement and she had clear margins. She is awaiting oncotype testing to determine if she will need to undergo chemotherapy. She saw Dr. Sondra Come with rad/onc on 01/29/20, planning for 4 weeks radiation treatment to start December 27th or 28th. She underwent CT simulation on 01/29/20. Hgb has been trending down, follow up CBC is stable and iron studies wnl. PCP ordered IFOB, if positive will need colonoscopy.   She is moderately compliant with CPAP. No issues with mask fit or pressure setting. Current pressure setting 4-8cm h20. She is wearing nasal mask. 90 day download showed 62% compliance > 4 hours. She often has trouble falling asleep at night d/t her mind racing. Bedtime is typically around midnight. She gets on average 5 hours of sleep at night. She does not have significant residual daytime fatigue. Drinks 2 cups of coffee during the day. DME company is [employer], she owns her CPAP machine.   Airview download 11/04/19- 02/01/20: Usage 67/90 days (74%); 56 days (62%) > 4 hours Average usage days used 5 hours 8 mins Pressure 4-8cm h20 (6.5cm h20 - 95%) Airleaks 0.1L/min AHI 0.2  02/11/2021- Interim hx  Patient presents today for annual OSA follow-up. She is doing well. No acute complaints. No issues with mask  fit or pressure setting. She takes [place] sparingly with improvement in occasional insomnia symptoms. No residual daytime sleepiness. She wears nasal sling mask.   Airview download 01/12/21-01/11/21 Usage 27/30 days used; 23 days (77%) > 4 hours Average usage 4 hours 43 mins Pressure 4-8cm h20 (6.5cm h20- 95%) Airleaks 0.1L/min  AHI 0.2   No Known Allergies  Immunization History  Administered Date(s) Administered   Influenza,inj,Quad PF,6+ Mos 11/28/2019   Influenza-Unspecified 11/27/2020   PFIZER(Purple Top)SARS-COV-2 Vaccination 02/10/2019, 03/03/2019, 11/07/2019   Tdap 11/09/2015    Past Medical History:  Diagnosis Date   Breast cancer ([place]) 2021   RIGHT   Deviated septum    Extensor tendinitis of foot 07/31/2013   First toe   Malignant neoplasm of upper-outer quadrant of right breast in female, estrogen receptor positive ([place]) 11/24/2019   Personal history of radiation therapy 2022   Sleep apnea    mild sleep apnea- uses CPAP nightly    Tobacco History: Social History   Tobacco Use  Smoking Status Never  Smokeless Tobacco Never   Counseling given: Not Answered   Outpatient Medications Prior to Visit  Medication Sig Dispense Refill   escitalopram (LEXAPRO) 5 MG tablet Take 1 tablet (5 mg total) by mouth daily. 30 tablet 1   tamoxifen (NOLVADEX) 20 MG tablet TAKE 1 TABLET BY MOUTH DAILY 90 tablet 4   zolpidem (AMBIEN) 5 MG tablet TAKE 1 TABLET BY MOUTH AT BEDTIME AS NEEDED FOR SLEEP 30 tablet 1   No facility-administered medications prior to visit.   Review of Systems  Review of Systems  Constitutional: Negative.   Respiratory: Negative.    Psychiatric/Behavioral: Negative.      Physical Exam  There were no vitals taken for this visit. Physical Exam Constitutional:      Appearance: Normal appearance.  HENT:     Head: Normocephalic and atraumatic.     Mouth/Throat:     Comments: Deferred d/t masking Cardiovascular:     Rate and Rhythm: Normal  rate.  Pulmonary:     Effort: Pulmonary effort is normal.  Skin:    General: Skin is warm and dry.  Neurological:     General: No focal deficit present.     Mental Status: She is alert and oriented to person, place, and time. Mental status is at baseline.  Psychiatric:        Mood and Affect: Mood normal.        Behavior: Behavior normal.        Thought Content: Thought content normal.        Judgment: Judgment normal.     Lab Results:  CBC    Component Value Date/Time   WBC 5.0 08/17/2020 1246   RBC 4.03 08/17/2020 1246   HGB 12.2 08/17/2020 1246   HGB 12.1 11/26/2019 1547   HCT 35.9 (L) 08/17/2020 1246   PLT 173 08/17/2020 1246   PLT 206 11/26/2019 1547   MCV 89.1 08/17/2020 1246   MCH 30.3 08/17/2020 1246   MCHC 34.0 08/17/2020 1246   RDW 13.0 08/17/2020 1246   LYMPHSABS 0.9 08/17/2020 1246   MONOABS 0.5 08/17/2020 1246   EOSABS 0.1 08/17/2020 1246   BASOSABS 0.0 08/17/2020 1246    BMET    Component Value Date/Time   NA 141 08/17/2020 1246   K 4.0 08/17/2020 1246   CL 108 08/17/2020 1246   CO2 27 08/17/2020 1246   GLUCOSE 106 (H) 08/17/2020 1246   BUN 18 08/17/2020 1246   CREATININE 0.75 08/17/2020 1246   CREATININE 0.79 11/26/2019 1547   CREATININE 0.84 11/14/2018 0944   CALCIUM 8.8 (L) 08/17/2020 1246   GFRNONAA >60 08/17/2020 1246   GFRNONAA >60 11/26/2019 1547   GFRNONAA 83 11/14/2018 0944   GFRAA 96 11/14/2018 0944    BNP No results found for: BNP  ProBNP No results found for: PROBNP  Imaging: No results found.   Assessment & Plan:   Obstructive sleep apnea - HST 11/12/2017 showed moderate OSA, AHI 17/2/hr with SpO2 low 80%. Patient is 77% compliant with CPAP >4 hours and reports benefit from use. NO issues with mask fit or pressure setting. Current CPAP pressure 4-8cm h20 (6.5cm h20-95%); Residual AHI 0.2. No changes today. Continue to encourage patient consistently wear CPAP every night for min 4-6 hours or longer. Recommend she maintain  healthy weight and advised against driving if experiencing excessive daytime fatigue. Follow-up annually.   Insomnia - Patient experiences occasional insomnia symptoms that are well controlled with prn Ambien 5mg  daily. She has no residual daytime drowsiness. She takes sleep aid on average 2-3 times a month, she does not need a refill at today's visit.   Martyn Ehrich, NP 02/11/2021

## 2021-02-13 IMAGING — MG MM BREAST SURGICAL SPECIMEN
1 series · 2 of 2 positions shown · non-contrast
Comparison: Previous exam(s).

CLINICAL DATA: Status post excisional biopsy of the right breast

EXAM:
SPECIMEN RADIOGRAPH OF THE RIGHT BREAST

[Series 1: R · right · 0.07mm/px · 2 of 2 slices shown]
[im 1/2]
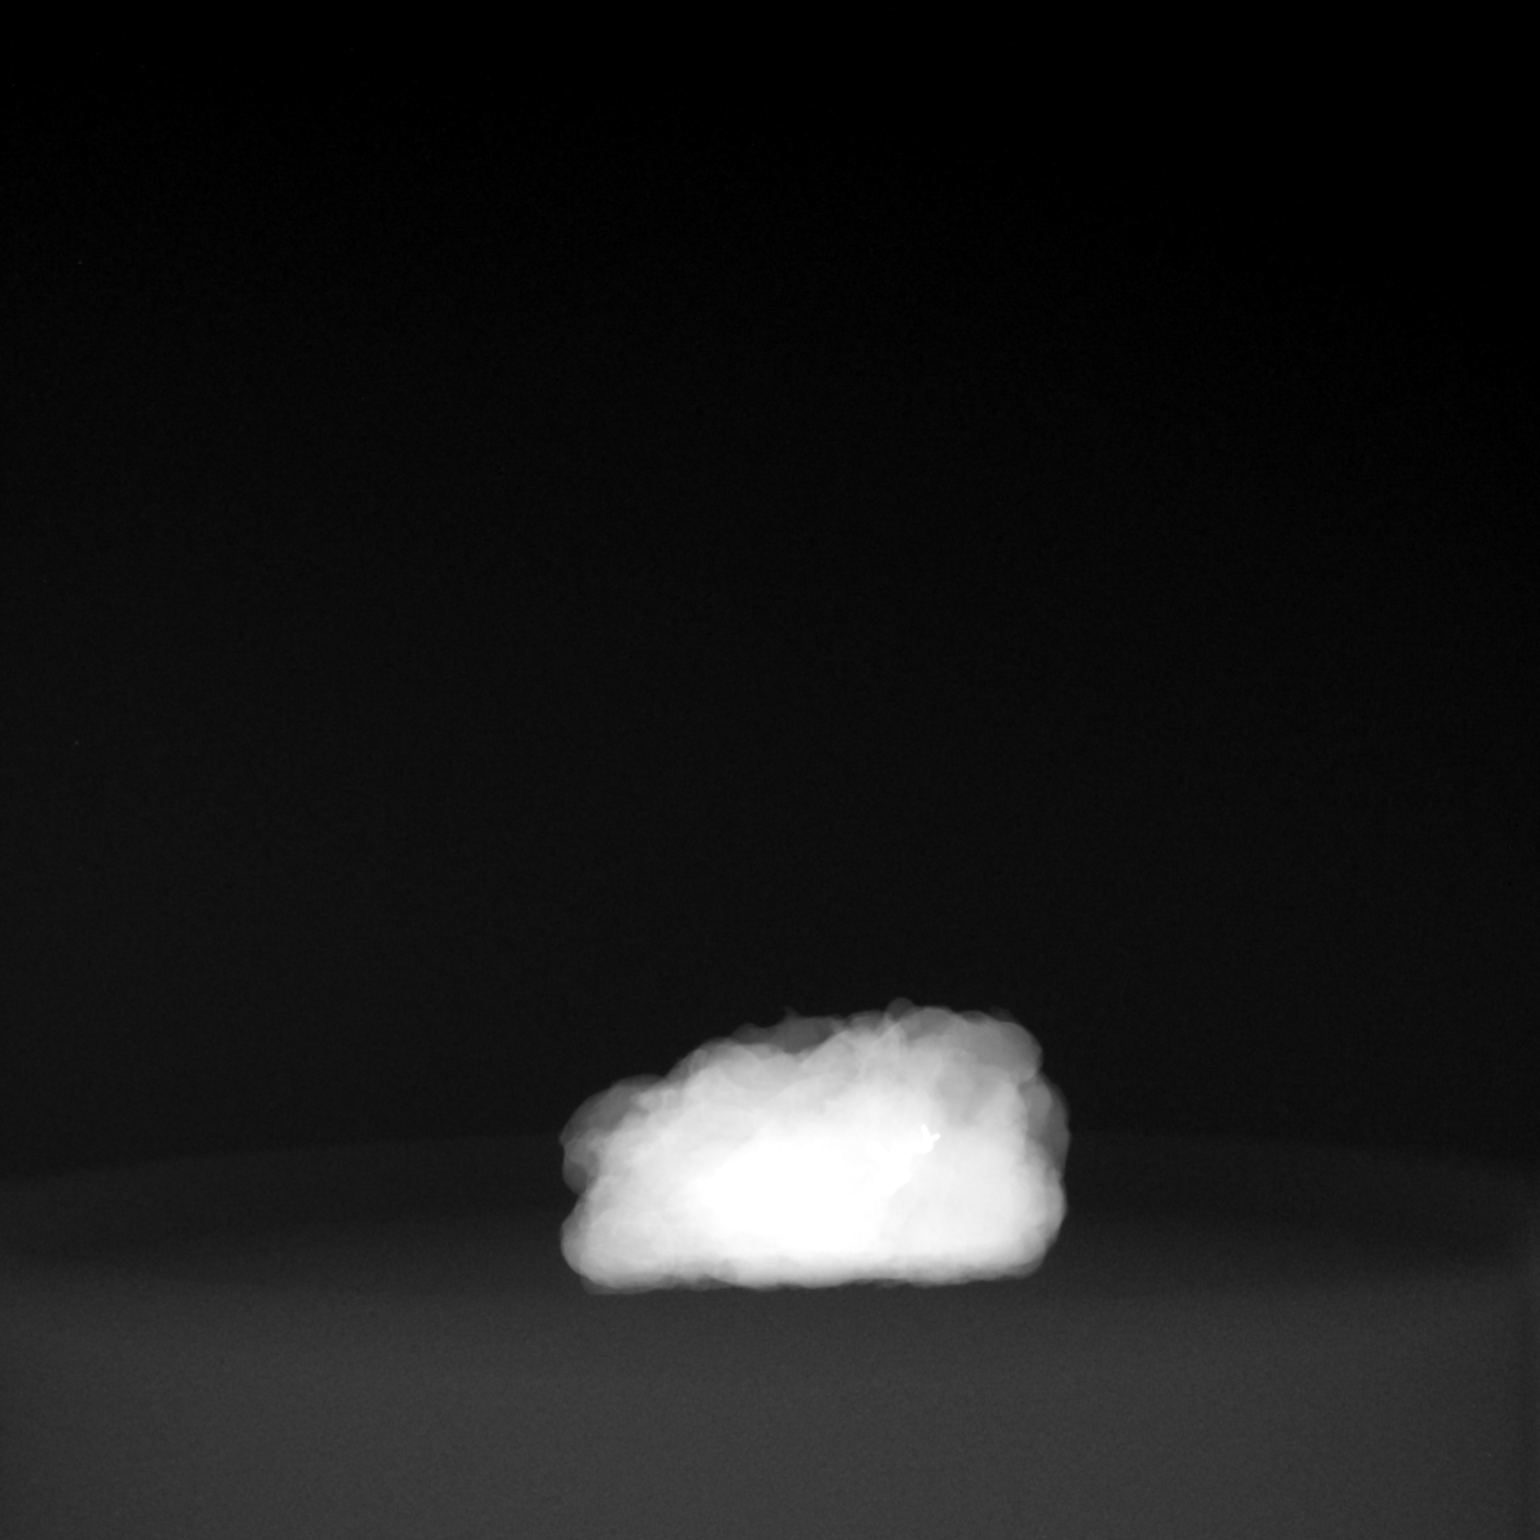
[im 2/2]
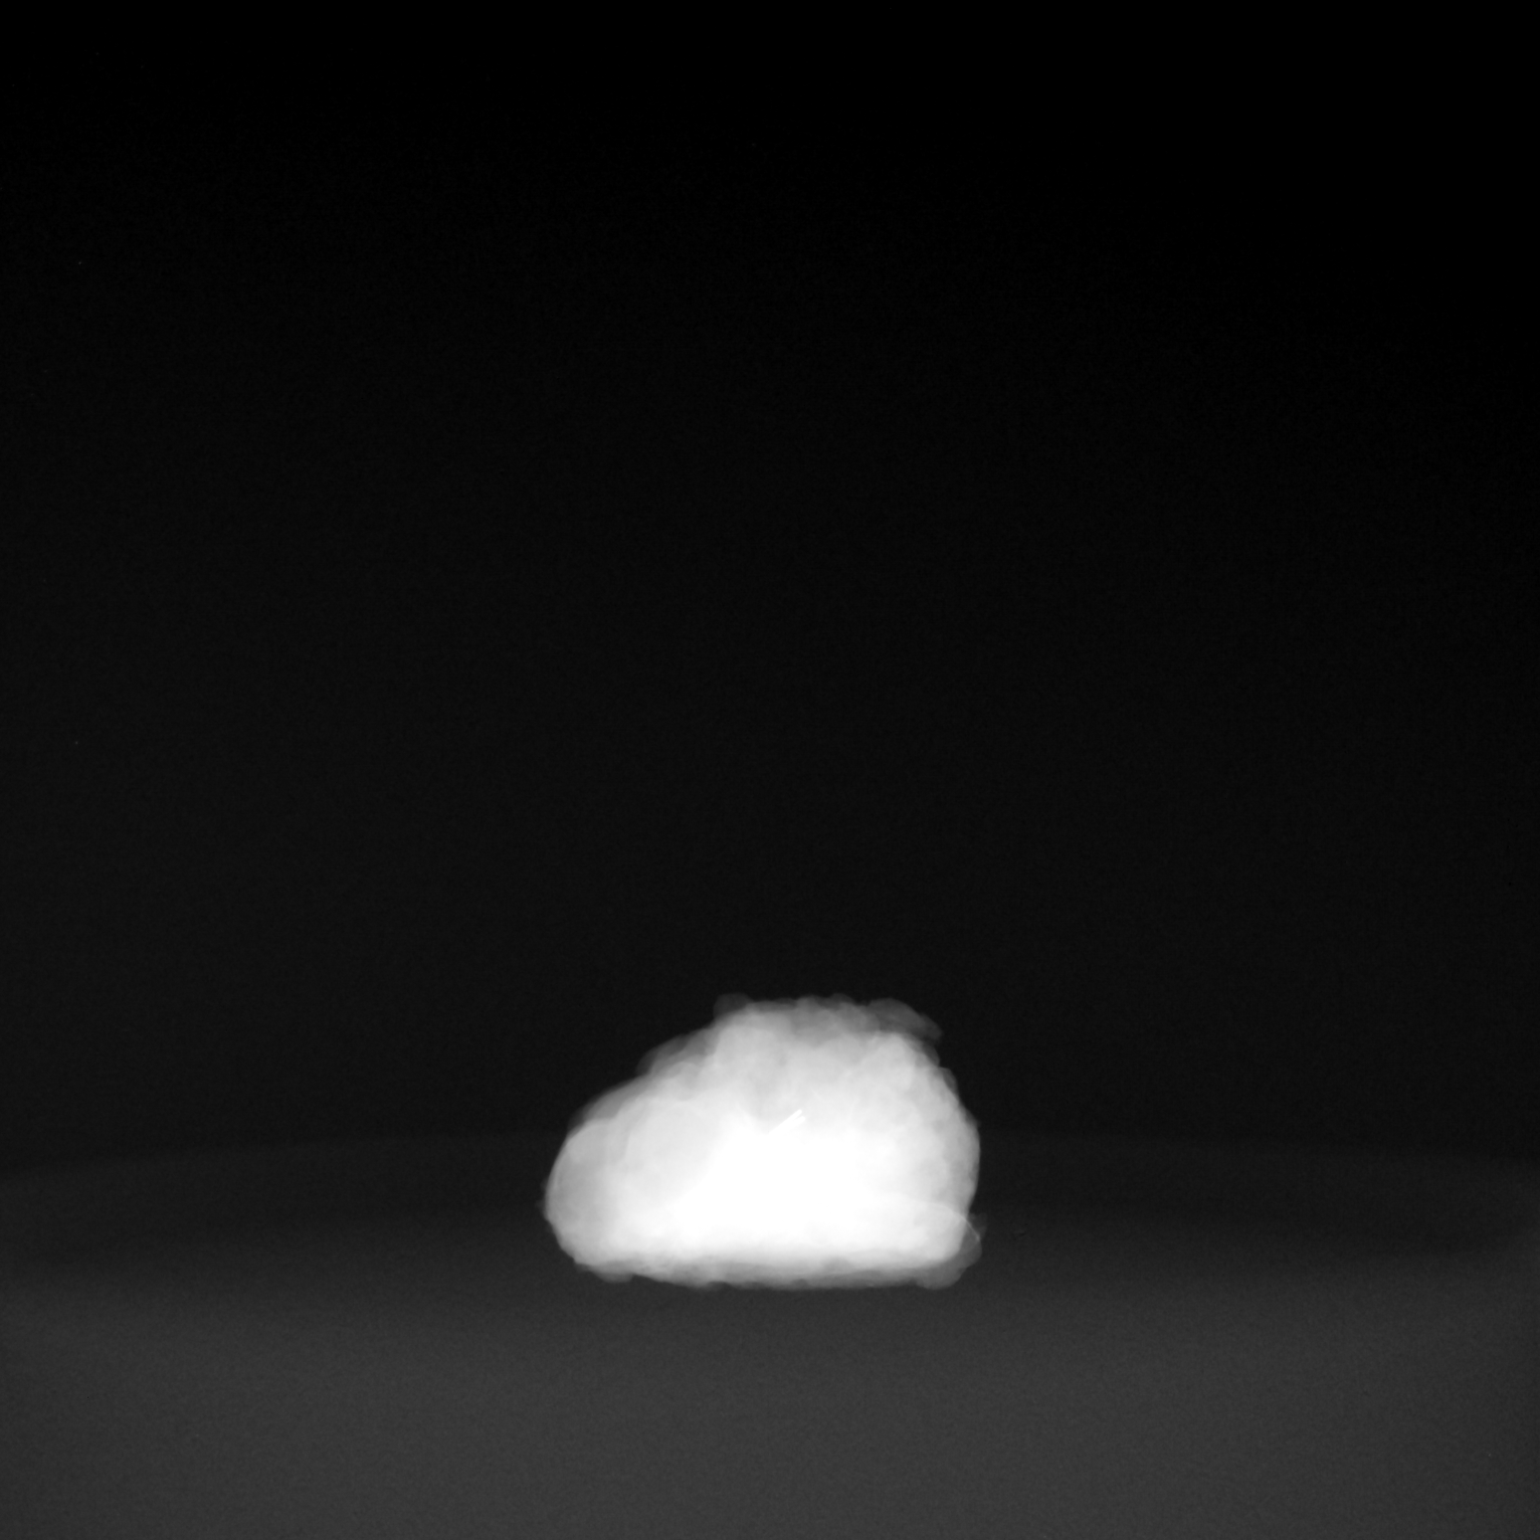

[2 of 2 positions shown; findings below may reference images not displayed]

FINDINGS: Status post excision of the right breast. The radioactive seed and
biopsy marker clips are present, completely intact, and were marked
for pathology.
IMPRESSION: Specimen radiograph of the right breast.

## 2021-02-17 ENCOUNTER — Ambulatory Visit: Payer: 59 | Admitting: Primary Care

## 2021-02-25 ENCOUNTER — Telehealth: Payer: 59 | Admitting: Family

## 2021-03-01 ENCOUNTER — Other Ambulatory Visit (HOSPITAL_COMMUNITY): Payer: Self-pay

## 2021-03-01 MED FILL — Tamoxifen Citrate Tab 20 MG (Base Equivalent): ORAL | 90 days supply | Qty: 90 | Fill #2 | Status: AC

## 2021-03-29 ENCOUNTER — Encounter (HOSPITAL_COMMUNITY): Payer: Self-pay

## 2021-05-18 ENCOUNTER — Telehealth: Payer: Self-pay | Admitting: Primary Care

## 2021-05-18 ENCOUNTER — Other Ambulatory Visit (HOSPITAL_COMMUNITY): Payer: Self-pay

## 2021-05-18 MED ORDER — ONDANSETRON HCL 4 MG PO TABS
4.0000 mg | ORAL_TABLET | Freq: Three times a day (TID) | ORAL | 0 refills | Status: DC | PRN
  Filled 2021-05-18: qty 20, 7d supply, fill #0

## 2021-05-18 MED ORDER — AZITHROMYCIN 250 MG PO TABS
ORAL_TABLET | ORAL | 0 refills | Status: DC
  Filled 2021-05-18: qty 6, 5d supply, fill #0

## 2021-05-18 NOTE — Telephone Encounter (Signed)
Sending in [facility] for URI symptoms and Zofran to take prn N/V/D  ?

## 2021-06-06 ENCOUNTER — Other Ambulatory Visit: Payer: Self-pay | Admitting: Adult Health

## 2021-06-06 ENCOUNTER — Encounter: Payer: Self-pay | Admitting: Adult Health

## 2021-06-06 ENCOUNTER — Other Ambulatory Visit: Payer: Self-pay

## 2021-06-06 DIAGNOSIS — C50411 Malignant neoplasm of upper-outer quadrant of right female breast: Secondary | ICD-10-CM

## 2021-06-12 NOTE — Therapy (Signed)
?OUTPATIENT PHYSICAL THERAPY ONCOLOGY EVALUATION ? ?Patient Name: Amber [name] ?MRN: [id number] ?DOB:Jun 26, 1970, 51 y.o., female ?Today's Date: 06/15/2021 ? ? PT End of Session - 06/15/21 1941   ? ? Visit Number 1   ? Number of Visits 12   ? Date for PT Re-Evaluation 07/27/21   ? PT Start Time [phone]   ? PT Stop Time 0850   ? PT Time Calculation (min) 43 min   ? Activity Tolerance Patient tolerated treatment well   ? Behavior During Therapy [facility] for tasks assessed/performed   ? ?  ?  ? ?  ? ? ?Past Medical History:  ?Diagnosis Date  ? Breast cancer ([place]) 2021  ? RIGHT  ? Deviated septum   ? Extensor tendinitis of foot 07/31/2013  ? First toe  ? Malignant neoplasm of upper-outer quadrant of right breast in female, estrogen receptor positive ([place]) 11/24/2019  ? Personal history of radiation therapy 2022  ? Sleep apnea   ? mild sleep apnea- uses CPAP nightly  ? ?Past Surgical History:  ?Procedure Laterality Date  ? BREAST BIOPSY Left 2019  ? BREAST BIOPSY Right 11/20/2019  ? BREAST BIOPSY Left 12/08/2019  ? BREAST LUMPECTOMY WITH RADIOACTIVE SEED AND SENTINEL LYMPH NODE BIOPSY Right 01/02/2020  ? Procedure: RIGHT BREAST LUMPECTOMY WITH RADIOACTIVE SEED AND RIGHT AXILLARY SENTINEL LYMPH NODE BIOPSY;  Surgeon: Rolm Bookbinder, MD;  Location: [place];  Service: General;  Laterality: Right;  PEC BLOCK  ? DILATION AND EVACUATION  07/14/2001  ? miscarriage  ? ENDOMETRIAL ABLATION W/ NOVASURE  2010  ? ?Patient Active Problem List  ? Diagnosis Date Noted  ? Acute reaction to situational stress 01/26/2021  ? Preventative health care 01/26/2021  ? Insomnia 02/02/2020  ? Genetic testing 12/08/2019  ? Malignant neoplasm of upper-outer quadrant of right breast in female, estrogen receptor positive ([place]) 11/24/2019  ? Deviated septum 11/27/2017  ? Obstructive sleep apnea 11/13/2017  ? ? ? ?REFERRING PROVIDER: Wilber Bihari, NP ? ?REFERRING DIAGNOSIS; S/p Right Breast Cancer ? ?THERAPY DIAG:  ?Aftercare following  surgery for neoplasm ? ?Abnormal posture ? ?Localized edema ? ?Malignant neoplasm of upper-outer quadrant of right breast in female, estrogen receptor positive ([place]) ? ?ONSET DATE: 01/2021 ? ?SUBJECTIVE                                                                                                                                                                                          ? ?SUBJECTIVE STATEMENT: ?Ever since sx she noticed a cord in the right axilla and in the last 6 months she has gotten more limited and feels pockets of swelling in  the lateral trunk. No complaints of breast swelling. Has occasional shoulder joint pain but most tightness/discomfort is in axilla and lateral trunk. ? 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 Donne Hazel. Radiation ended Jan. 2022  ? ?PAIN:  ?Are you having pain? Yes ?NPRS scale: 1-2/10 ?Pain location: right axilla and lateral trunk ?Pain orientation: Right  ?PAIN TYPE: aching, burning, and tight ?Pain description: constant  ?Aggravating factors: laying on right side, reaching activities ?Relieving factors: progressively getting worse ? ?PRECAUTIONS: Other: s/p Right lumpectomy with SLNB ? ?WEIGHT BEARING RESTRICTIONS No ? ?FALLS:  ?Has patient fallen in last 6 months? No ? ?LIVING ENVIRONMENT: ?Lives with: lives with their familyspouse, 3 children ?Lives in: House/apartmentInternal: 10 steps; yes ?Stairs: Yes;  ?Has following equipment at home: none ? 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 ? ?LEISURE: walking, gym exercises ? ?HAND DOMINANCE : right  ? ?PRIOR LEVEL OF FUNCTION: Independent ? ?PATIENT GOALS Want fluid to go away, tightness go away, improve mobility, prevent lymphedema in right UE. ? ? ?OBJECTIVE ? ?COGNITION: ? Overall cognitive status: Within functional limits for  tasks assessed  ? ?PALPATION: Scar tissue noted under breast and axillary incisions, no breast fibrosis noted. No palpable cords noted, but tightness assosciated with possible cords noted ? ?OBSERVATIONS / OTHER ASSESSMENTS: no visible breast edema, ? Lateral trunk swelling at lateral rib cage however, pt noted to have significant leg length difference/scoliosis with some anterior rib changes ? ?SENSATION: ? Light touch: Appears intact ?  ? ?POSTURE: Forw[place]ard headround shoulders  ? ?UPPER EXTREMITY AROM/PROM: ? ?A/PROM RIGHT  06/15/2021 ?  ?Shoulder extension 56  ?Shoulder flexion 158, trunk pulling  ?Shoulder abduction 170 trunk/axillary pulling  ?Shoulder internal rotation 65  ?Shoulder external rotation 95  ?  (Blank rows = not tested) ? ?A/PROM LEFT  06/15/2021  ?Shoulder extension 56  ?Shoulder flexion 165  ?Shoulder abduction 178  ?Shoulder internal rotation 75  ?Shoulder external rotation 94  ?  (Blank rows = not tested) ? ? ?CERVICAL AROM: ?All within normal limits:  ? ?UPPER EXTREMITY STRENGTH: WFL ? ? ?LYMPHEDEMA ASSESSMENTS:  ? ?SURGERY TYPE/DATE: Right lumpectomy with SLNB 01/02/2020 ? ?NUMBER OF LYMPH NODES REMOVED: 5 ? ?CHEMOTHERAPY: no ? ?RADIATION:ended 02/2020 ? ?HORMONE TREATMENT: yes, tamoxifen ? ?INFECTIONS: no ? ?LYMPHEDEMA ASSESSMENTS:  ? ?[place] RIGHT  06/15/2021  ?10 cm proximal to olecranon process 25.2  ?Olecranon process 22.9  ?10 cm proximal to ulnar styloid process 19.1  ?Just proximal to ulnar styloid process 14.8  ?Across hand at thumb web space 20.1  ?At base of 2nd digit 6.0  ?(Blank rows = not tested) ? ?[place] LEFT  06/15/2021  ?10 cm proximal to olecranon process 25.3  ?Olecranon process 23.1  ?10 cm proximal to ulnar styloid process 19.0  ?Just proximal to ulnar styloid process 14.7  ?Across hand at thumb web space 19.0  ?At base of 2nd digit 5.8  ?(Blank rows = not tested) ? ? ? ? ?L-DEX LYMPHEDEMA SCREENING: ? ?The patient was assessed using the L-Dex machine today to produce a  lymphedema index baseline score. The patient will be reassessed on a regular basis (typically every 3 months) to obtain new L-Dex scores. If the score is > 6.5 points away from his/her baseline score indicating onset of subclinical lymphedema, it will be recommended to wear a compression  garment for 4 weeks, 12 hours per day and then be reassessed. If the score continues to be > 6.5 points from baseline at reassessment, we will initiate lymphedema treatment. Assessing in this manner has a 95% rate of preventing clinically significant lymphedema. ? ? L-DEX FLOWSHEETS - 06/15/21 0900   ? ?  ? L-DEX LYMPHEDEMA SCREENING  ? Measurement Type Unilateral   ? L-DEX MEASUREMENT EXTREMITY Upper Extremity   ? POSITION  Standing   ? DOMINANT SIDE Right   ? At Risk Side Right   ? BASELINE SCORE (UNILATERAL) 0.8   ? L-DEX SCORE (UNILATERAL) -0.7   ? VALUE CHANGE (UNILAT) -1.5   ? BASELINE RIGHT --   ? ?  ?  ? ?  ? ? ? ?QUICK DASH SURVEY: 11.36 ? ? ?TODAY'S TREATMENT  ?Pt educated in supine wand flexion and scaption and lower trunk rotation to address pt tightness. Educated to take to gentle stretch and no right shoulder pain. Discussed depressing scapula prn.  Discussed POC and treatment interventions suggested. ? ?PATIENT EDUCATION:  ?Education details: supine wand flex and scaption, lower trunk rotation with arms outstretched ?Person educated: Patient ?Education method: Explanation ?Education comprehension: verbalized understanding ? ? ?HOME EXERCISE PROGRAM: ?Supine wand flexion and scaption, lower trunk rotation ? ?ASSESSMENT: ? ?CLINICAL IMPRESSION: ?Patient is a 51 y.o. female who was seen today for physical therapy evaluation and treatment for complaints of axillary and right lateral trunk tightness and concerns of swelling. Mild limitation of right shoulder ROM but with significant complaints of tightness in axilla, arm and lateral trunk..Pt is s/p Right lumpectomy with SLNB on 01/02/2020 with radiation ending in Jan. Of  2022. There is no visible breast swelling and ? Lateral trunk swelling over right lateral trunk/ribcage but also with noted rib cage prominence on right. She will benefit from skilled therapy to address defic

## 2021-06-15 ENCOUNTER — Ambulatory Visit (HOSPITAL_COMMUNITY)
Admission: RE | Admit: 2021-06-15 | Discharge: 2021-06-15 | Disposition: A | Discharge status: Home / Self Care | Payer: 59 | Source: Ambulatory Visit | Attending: Adult Health | Admitting: Adult Health

## 2021-06-15 ENCOUNTER — Encounter (HOSPITAL_COMMUNITY): Payer: Self-pay

## 2021-06-15 ENCOUNTER — Ambulatory Visit: Payer: 59 | Attending: Adult Health

## 2021-06-15 DIAGNOSIS — C50411 Malignant neoplasm of upper-outer quadrant of right female breast: Secondary | ICD-10-CM | POA: Insufficient documentation

## 2021-06-15 DIAGNOSIS — Z170 Estrogen receptor positive status [ER+]: Secondary | ICD-10-CM | POA: Insufficient documentation

## 2021-06-15 DIAGNOSIS — R600 Localized edema: Secondary | ICD-10-CM | POA: Insufficient documentation

## 2021-06-15 DIAGNOSIS — R293 Abnormal posture: Secondary | ICD-10-CM | POA: Insufficient documentation

## 2021-06-15 DIAGNOSIS — Z483 Aftercare following surgery for neoplasm: Secondary | ICD-10-CM | POA: Diagnosis not present

## 2021-06-15 DIAGNOSIS — C50919 Malignant neoplasm of unspecified site of unspecified female breast: Secondary | ICD-10-CM | POA: Diagnosis not present

## 2021-06-15 MED ORDER — IOHEXOL 300 MG/ML  SOLN
100.0000 mL | Freq: Once | INTRAMUSCULAR | Status: AC | PRN
  Administered 2021-06-15: 75 mL via INTRAVENOUS

## 2021-06-15 MED ORDER — SODIUM CHLORIDE (PF) 0.9 % IJ SOLN
INTRAMUSCULAR | Status: AC
  Filled 2021-06-15: qty 50

## 2021-06-16 ENCOUNTER — Other Ambulatory Visit: Payer: Self-pay | Admitting: Primary Care

## 2021-06-16 ENCOUNTER — Other Ambulatory Visit (HOSPITAL_COMMUNITY): Payer: Self-pay

## 2021-06-16 MED ORDER — ZOLPIDEM TARTRATE 5 MG PO TABS
ORAL_TABLET | Freq: Every evening | ORAL | 1 refills | Status: DC | PRN
  Filled 2021-06-16: qty 30, 30d supply, fill #0
  Filled 2021-10-04: qty 30, 30d supply, fill #1

## 2021-06-16 NOTE — Telephone Encounter (Signed)
Needs refill for Ambien '5mg'$ , sent to pharmacy on file. PMP reviewed.  ?

## 2021-06-17 ENCOUNTER — Other Ambulatory Visit: Payer: Self-pay | Admitting: Adult Health

## 2021-06-17 ENCOUNTER — Other Ambulatory Visit (HOSPITAL_COMMUNITY): Payer: Self-pay

## 2021-06-17 DIAGNOSIS — K769 Liver disease, unspecified: Secondary | ICD-10-CM

## 2021-06-17 MED ORDER — TAMOXIFEN CITRATE 20 MG PO TABS
ORAL_TABLET | Freq: Every day | ORAL | 4 refills | Status: AC
  Filled 2021-06-17: qty 90, 90d supply, fill #0
  Filled 2021-10-06: qty 90, 90d supply, fill #1
  Filled 2022-01-24: qty 90, 90d supply, fill #2
  Filled 2022-05-11: qty 90, 90d supply, fill #3

## 2021-06-17 NOTE — Progress Notes (Signed)
MRI liver ordered to evaluate RUQ symptoms and indeterminate liver lesions noted on PET.   ? ?Wilber Bihari, NP 06/17/21 9:24 AM ?Medical Oncology and Hematology ?[place] ?[address] ?[place], [place] [address] ?Tel. [phone]    Fax. [phone] ? ?

## 2021-06-21 ENCOUNTER — Ambulatory Visit: Payer: 59

## 2021-06-21 DIAGNOSIS — C50411 Malignant neoplasm of upper-outer quadrant of right female breast: Secondary | ICD-10-CM | POA: Diagnosis not present

## 2021-06-21 DIAGNOSIS — Z483 Aftercare following surgery for neoplasm: Secondary | ICD-10-CM

## 2021-06-21 DIAGNOSIS — Z170 Estrogen receptor positive status [ER+]: Secondary | ICD-10-CM

## 2021-06-21 DIAGNOSIS — R600 Localized edema: Secondary | ICD-10-CM

## 2021-06-21 DIAGNOSIS — C50911 Malignant neoplasm of unspecified site of right female breast: Secondary | ICD-10-CM

## 2021-06-21 DIAGNOSIS — R293 Abnormal posture: Secondary | ICD-10-CM

## 2021-06-21 NOTE — Therapy (Signed)
?OUTPATIENT PHYSICAL THERAPY ONCOLOGY TREATMENT ? ?Patient Name: Amber [name] ?MRN: [id number] ?DOB:[birth year], 51 y.o., female ?Today's Date: 06/21/2021 ? ? PT End of Session - 06/21/21 0806   ? ? Visit Number 2   ? Number of Visits 12   ? Date for PT Re-Evaluation 07/27/21   ? PT Start Time [phone]   pt late  ? PT Stop Time 0855   ? PT Time Calculation (min) 49 min   ? Activity Tolerance Patient tolerated treatment well   ? Behavior During Therapy [facility] for tasks assessed/performed   ? ?  ?  ? ?  ? ? ?Past Medical History:  ?Diagnosis Date  ? Breast cancer ([place]) 2021  ? RIGHT  ? Deviated septum   ? Extensor tendinitis of foot 07/31/2013  ? First toe  ? Malignant neoplasm of upper-outer quadrant of right breast in female, estrogen receptor positive ([place]) 11/24/2019  ? Personal history of radiation therapy 2022  ? Sleep apnea   ? mild sleep apnea- uses CPAP nightly  ? ?Past Surgical History:  ?Procedure Laterality Date  ? BREAST BIOPSY Left 2019  ? BREAST BIOPSY Right 11/20/2019  ? BREAST BIOPSY Left 12/08/2019  ? BREAST LUMPECTOMY WITH RADIOACTIVE SEED AND SENTINEL LYMPH NODE BIOPSY Right 01/02/2020  ? Procedure: RIGHT BREAST LUMPECTOMY WITH RADIOACTIVE SEED AND RIGHT AXILLARY SENTINEL LYMPH NODE BIOPSY;  Surgeon: Rolm Bookbinder, MD;  Location: [place];  Service: General;  Laterality: Right;  PEC BLOCK  ? DILATION AND EVACUATION  07/14/2001  ? miscarriage  ? ENDOMETRIAL ABLATION W/ NOVASURE  2010  ? ?Patient Active Problem List  ? Diagnosis Date Noted  ? Acute reaction to situational stress 01/26/2021  ? Preventative health care 01/26/2021  ? Insomnia 02/02/2020  ? Genetic testing 12/08/2019  ? Malignant neoplasm of upper-outer quadrant of right breast in female, estrogen receptor positive ([place]) 11/24/2019  ? Deviated septum 11/27/2017  ? Obstructive sleep apnea 11/13/2017  ? ? ? ?REFERRING PROVIDER: Wilber Bihari, NP ? ?REFERRING DIAGNOSIS; S/p Right Breast Cancer ? ?THERAPY DIAG:  ?Aftercare  following surgery for neoplasm ? ?Abnormal posture ? ?Localized edema ? ?Malignant neoplasm of upper-outer quadrant of right breast in female, estrogen receptor positive ([place]) ? ?Malignant neoplasm of right breast in female, estrogen receptor positive, unspecified site of breast ([place]) ? ?ONSET DATE: 01/2021 ? ?SUBJECTIVE                                                                                                                                                                                          ? ?SUBJECTIVE STATEMENT: ?I have been doing the exercises about 1 time  per day.  I think it has loosened up some. I think I  have loosened up some along the trunk ? 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 Donne Hazel. Radiation ended Jan. 2022  ? ?PAIN:  ?Are you having pain?  ?NPRS scale: none today, ?Pain location: right axilla and lateral trunk when present ?Pain orientation: Right  ?PAIN TYPE: aching, burning, and tight ?Pain description: constant  ?Aggravating factors: laying on right side, reaching activities ?Relieving factors: progressively getting worse ? ?PRECAUTIONS: Other: s/p Right lumpectomy with SLNB ? ?WEIGHT BEARING RESTRICTIONS No ? ?FALLS:  ?Has patient fallen in last 6 months? No ? ?LIVING ENVIRONMENT: ?Lives with: lives with their familyspouse, 3 children ?Lives in: House/apartmentInternal: 10 steps; yes ?Stairs: Yes;  ?Has following equipment at home: none ? 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 ? ?LEISURE: walking, gym exercises ? ?HAND DOMINANCE : right  ? ?PRIOR LEVEL OF FUNCTION: Independent ? ?PATIENT GOALS Want fluid to go away, tightness go away, improve mobility, prevent lymphedema in right UE. ? ? ?OBJECTIVE ? ?COGNITION: ? Overall cognitive status: Within functional limits for tasks  assessed  ? ?PALPATION: Scar tissue noted under breast and axillary incisions, no breast fibrosis noted. No palpable cords noted, but tightness assosciated with possible cords noted ? ?OBSERVATIONS / OTHER ASSESSMENTS: no visible breast edema, ? Lateral trunk swelling at lateral rib cage however, pt noted to have significant leg length difference/scoliosis with some anterior rib changes ? ?SENSATION: ? Light touch: Appears intact ?  ? ?POSTURE: [place]ard headround shoulders  ? ?UPPER EXTREMITY AROM/PROM: ? ?A/PROM RIGHT  06/15/2021 ?  ?Shoulder extension 56  ?Shoulder flexion 158, trunk pulling  ?Shoulder abduction 170 trunk/axillary pulling  ?Shoulder internal rotation 65  ?Shoulder external rotation 95  ?  (Blank rows = not tested) ? ?A/PROM LEFT  06/15/2021  ?Shoulder extension 56  ?Shoulder flexion 165  ?Shoulder abduction 178  ?Shoulder internal rotation 75  ?Shoulder external rotation 94  ?  (Blank rows = not tested) ? ? ?CERVICAL AROM: ?All within normal limits:  ? ?UPPER EXTREMITY STRENGTH: WFL ? ? ?LYMPHEDEMA ASSESSMENTS:  ? ?SURGERY TYPE/DATE: Right lumpectomy with SLNB 01/02/2020 ? ?NUMBER OF LYMPH NODES REMOVED: 5 ? ?CHEMOTHERAPY: no ? ?RADIATION:ended 02/2020 ? ?HORMONE TREATMENT: yes, tamoxifen ? ?INFECTIONS: no ? ?LYMPHEDEMA ASSESSMENTS:  ? ?[place] RIGHT  06/15/2021  ?10 cm proximal to olecranon process 25.2  ?Olecranon process 22.9  ?10 cm proximal to ulnar styloid process 19.1  ?Just proximal to ulnar styloid process 14.8  ?Across hand at thumb web space 20.1  ?At base of 2nd digit 6.0  ?(Blank rows = not tested) ? ?[place] LEFT  06/15/2021  ?10 cm proximal to olecranon process 25.3  ?Olecranon process 23.1  ?10 cm proximal to ulnar styloid process 19.0  ?Just proximal to ulnar styloid process 14.7  ?Across hand at thumb web space 19.0  ?At base of 2nd digit 5.8  ?(Blank rows = not tested) ? ? ? ? ?L-DEX LYMPHEDEMA SCREENING: ? ?The patient was assessed using the L-Dex machine today to produce a lymphedema  index baseline score. The patient will be reassessed on a regular basis (typically every 3 months) to obtain new L-Dex scores. If the score is > 6.5 points away from his/her baseline score indicating onset of subclinical lymphedema, it will be recommended to wear  a compression garment for 4 weeks, 12 hours per day and then be reassessed. If the score continues to be > 6.5 points from baseline at reassessment, we will initiate lymphedema treatment. Assessing in this manner has a 95% rate of preventing clinically significant lymphedema. ? ? ? ? ? ?QUICK DASH SURVEY: 11.36 ? ? ?TODAY'S TREATMENT  ?06/21/2021 ?Pulleys for flexion, scaption and abduction x 1:30 ea, ball rolls for flexion x 10, abd x 5 ?Soft tissue mobilization to right upper quarter with cocoa butter with emphasis on UT, pectorals, lats, and in SL serratus and periscapular region. ?PROM to right shoulder with MFR to pectorals and lats for flexion, scaption, d2 flexion and abd. ?MLD: therapist activated supraclavicular, 5 diaphragmatic breaths,right axillary and inguinal LN's and right axillo inguinal pathway with verbalization to pt while performing, then had pt perform all techniques, ending with LN's. Pt was given written instructions. ? ? ?06/15/2021 ?Pt educated in supine wand flexion and scaption and lower trunk rotation to address pt tightness. Educated to take to gentle stretch and no right shoulder pain. Discussed depressing scapula prn.  Discussed POC and treatment interventions suggested. ? ?PATIENT EDUCATION:  ?Education details: supine wand flex and scaption, lower trunk rotation with arms outstretched ?Person educated: Patient ?Education method: Explanation ?Education comprehension: verbalized understanding ? ? ?HOME EXERCISE PROGRAM: ?Supine wand flexion and scaption, lower trunk rotation ? ?ASSESSMENT: ? ?CLINICAL IMPRESSION: ?Performed ARAROM exs with pulleys and ball, soft tissue mobilization to right upper quarter, PROM and MLD. Pt was  instructed in MLD techniques to right lateral trunk and was given written instructions. She required intermittent VC's for stretch and technique but did very well overall. ? ? ?OBJECTIVE IMPAIRMENTS decreased activity to

## 2021-06-23 ENCOUNTER — Other Ambulatory Visit: Payer: Self-pay | Admitting: Adult Health

## 2021-06-23 DIAGNOSIS — C50411 Malignant neoplasm of upper-outer quadrant of right female breast: Secondary | ICD-10-CM

## 2021-06-28 ENCOUNTER — Ambulatory Visit: Payer: 59

## 2021-06-28 DIAGNOSIS — R600 Localized edema: Secondary | ICD-10-CM | POA: Diagnosis not present

## 2021-06-28 DIAGNOSIS — Z483 Aftercare following surgery for neoplasm: Secondary | ICD-10-CM

## 2021-06-28 DIAGNOSIS — C50411 Malignant neoplasm of upper-outer quadrant of right female breast: Secondary | ICD-10-CM | POA: Diagnosis not present

## 2021-06-28 DIAGNOSIS — Z170 Estrogen receptor positive status [ER+]: Secondary | ICD-10-CM | POA: Diagnosis not present

## 2021-06-28 DIAGNOSIS — R293 Abnormal posture: Secondary | ICD-10-CM | POA: Diagnosis not present

## 2021-06-28 DIAGNOSIS — C50911 Malignant neoplasm of unspecified site of right female breast: Secondary | ICD-10-CM

## 2021-06-28 NOTE — Therapy (Signed)
?OUTPATIENT PHYSICAL THERAPY ONCOLOGY TREATMENT ? ?Patient Name: Amber [name] ?MRN: [id number] ?DOB:[birth year], 51 y.o., female ?Today's Date: 06/28/2021 ? ? PT End of Session - 06/28/21 0802   ? ? Visit Number 3   ? Number of Visits 12   ? Date for PT Re-Evaluation 07/27/21   ? PT Start Time [phone]   ? Activity Tolerance Patient tolerated treatment well   ? Behavior During Therapy [facility] for tasks assessed/performed   ? ?  ?  ? ?  ? ? ?Past Medical History:  ?Diagnosis Date  ? Breast cancer ([place]) 2021  ? RIGHT  ? Deviated septum   ? Extensor tendinitis of foot 07/31/2013  ? First toe  ? Malignant neoplasm of upper-outer quadrant of right breast in female, estrogen receptor positive ([place]) 11/24/2019  ? Personal history of radiation therapy 2022  ? Sleep apnea   ? mild sleep apnea- uses CPAP nightly  ? ?Past Surgical History:  ?Procedure Laterality Date  ? BREAST BIOPSY Left 2019  ? BREAST BIOPSY Right 11/20/2019  ? BREAST BIOPSY Left 12/08/2019  ? BREAST LUMPECTOMY WITH RADIOACTIVE SEED AND SENTINEL LYMPH NODE BIOPSY Right 01/02/2020  ? Procedure: RIGHT BREAST LUMPECTOMY WITH RADIOACTIVE SEED AND RIGHT AXILLARY SENTINEL LYMPH NODE BIOPSY;  Surgeon: Rolm Bookbinder, MD;  Location: [facility];  Service: General;  Laterality: Right;  PEC BLOCK  ? DILATION AND EVACUATION  07/14/2001  ? miscarriage  ? ENDOMETRIAL ABLATION W/ NOVASURE  2010  ? ?Patient Active Problem List  ? Diagnosis Date Noted  ? Acute reaction to situational stress 01/26/2021  ? Preventative health care 01/26/2021  ? Insomnia 02/02/2020  ? Genetic testing 12/08/2019  ? Malignant neoplasm of upper-outer quadrant of right breast in female, estrogen receptor positive ([place]) 11/24/2019  ? Deviated septum 11/27/2017  ? Obstructive sleep apnea 11/13/2017  ? ? ? ?REFERRING PROVIDER: Wilber Bihari, NP ? ?REFERRING DIAGNOSIS; S/p Right Breast Cancer ? ?THERAPY DIAG:  ?Aftercare following surgery for neoplasm ? ?Abnormal posture ? ?Localized  edema ? ?Malignant neoplasm of upper-outer quadrant of right breast in female, estrogen receptor positive ([place]) ? ?Malignant neoplasm of right breast in female, estrogen receptor positive, unspecified site of breast ([place]) ? ?ONSET DATE: 01/2021 ? ?SUBJECTIVE                                                                                                                                                                                          ? ?SUBJECTIVE STATEMENT: ?I have been doing the exercises about 1-2 time per day.  I still feel the tightness but it is better. It is not as tender as it  was .  I can still feel the cord but its not as bad. I have been doing the MLD at home. I think the swelling might be better too. ? 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 Donne Hazel. Radiation ended Jan. 2022  ? ?PAIN:  ?Are you having pain?  ?NPRS scale: none today, ?Pain location: right axilla and lateral trunk when present ?Pain orientation: Right  ?PAIN TYPE: aching, burning, and tight ?Pain description: constant  ?Aggravating factors: laying on right side, reaching activities ?Relieving factors: progressively getting worse ? ?PRECAUTIONS: Other: s/p Right lumpectomy with SLNB ? ?WEIGHT BEARING RESTRICTIONS No ? ?FALLS:  ?Has patient fallen in last 6 months? No ? ?LIVING ENVIRONMENT: ?Lives with: lives with their familyspouse, 3 children ?Lives in: House/apartmentInternal: 10 steps; yes ?Stairs: Yes;  ?Has following equipment at home: none ? 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 ? ?LEISURE: walking, gym exercises ? ?HAND DOMINANCE : right  ? ?PRIOR LEVEL OF FUNCTION: Independent ? ?PATIENT GOALS Want fluid to go away, tightness go away, improve mobility, prevent lymphedema in right UE. ? ? ?OBJECTIVE ? ?COGNITION: ? Overall cognitive  status: Within functional limits for tasks assessed  ? ?PALPATION: Scar tissue noted under breast and axillary incisions, no breast fibrosis noted. No palpable cords noted, but tightness assosciated with possible cords noted ? ?OBSERVATIONS / OTHER ASSESSMENTS: no visible breast edema, ? Lateral trunk swelling at lateral rib cage however, pt noted to have significant leg length difference/scoliosis with some anterior rib changes ? ?SENSATION: ? Light touch: Appears intact ?  ? ?POSTURE: Forw,ard headround shoulders  ? ?UPPER EXTREMITY AROM/PROM: ? ?A/PROM RIGHT  06/15/2021 ?  ?Shoulder extension 56  ?Shoulder flexion 158, trunk pulling  ?Shoulder abduction 170 trunk/axillary pulling  ?Shoulder internal rotation 65  ?Shoulder external rotation 95  ?  (Blank rows = not tested) ? ?A/PROM LEFT  06/15/2021  ?Shoulder extension 56  ?Shoulder flexion 165  ?Shoulder abduction 178  ?Shoulder internal rotation 75  ?Shoulder external rotation 94  ?  (Blank rows = not tested) ? ? ?CERVICAL AROM: ?All within normal limits:  ? ?UPPER EXTREMITY STRENGTH: WFL ? ? ?LYMPHEDEMA ASSESSMENTS:  ? ?SURGERY TYPE/DATE: Right lumpectomy with SLNB 01/02/2020 ? ?NUMBER OF LYMPH NODES REMOVED: 5 ? ?CHEMOTHERAPY: no ? ?RADIATION:ended 02/2020 ? ?HORMONE TREATMENT: yes, tamoxifen ? ?INFECTIONS: no ? ?LYMPHEDEMA ASSESSMENTS:  ? ?[place]is RIGHT  06/15/2021  ?10 cm proximal to olecranon process 25.2  ?Olecranon process 22.9  ?10 cm proximal to ulnar styloid process 19.1  ?Just proximal to ulnar styloid process 14.8  ?Across hand at thumb web space 20.1  ?At base of 2nd digit 6.0  ?(Blank rows = not tested) ? ?[place] LEFT  06/15/2021  ?10 cm proximal to olecranon process 25.3  ?Olecranon process 23.1  ?10 cm proximal to ulnar styloid process 19.0  ?Just proximal to ulnar styloid process 14.7  ?Across hand at thumb web space 19.0  ?At base of 2nd digit 5.8  ?(Blank rows = not tested) ? ? ? ? ?L-DEX LYMPHEDEMA SCREENING: ? ?The patient was assessed using the  L-Dex machine today to produce a lymphedema index baseline score. The patient will be reassessed on a regular basis (typically every 3 months) to obtain new L-Dex scores. If the score is > 6.5 points away from his/her baseline score indicating onset  of subclinical lymphedema, it will be recommended to wear a compression garment for 4 weeks, 12 hours per day and then be reassessed. If the score continues to be > 6.5 points from baseline at reassessment, we will initiate lymphedema treatment. Assessing in this manner has a 95% rate of preventing clinically significant lymphedema. ? ? ? ? ? ?QUICK DASH SURVEY: 11.36 ? ? ?TODAY'S TREATMENT  ? ?06/28/2021 ?Soft tissue mobilization to right upper quarter with cocoa butter with emphasis on UT, pectorals, lats, and in SL serratus and periscapular region. ?PROM to right shoulder with MFR to pectorals and lats for flexion, scaption, d2 flexion and abd. ?MLD: therapist activated supraclavicular, 5 diaphragmatic breaths,right axillary and inguinal LN's and right axillo inguinal pathway with verbalization to pt while performing, then had pt perform all techniques, ending with LN's ?Pectoralis wall stretch x 3 ? ?06/21/2021 ?Pulleys for flexion, scaption and abduction x 1:30 ea, ball rolls for flexion x 10, abd x 5 ?Soft tissue mobilization to right upper quarter with cocoa butter with emphasis on UT, pectorals, lats, and in SL serratus and periscapular region. ?PROM to right shoulder with MFR to pectorals and lats for flexion, scaption, d2 flexion and abd. ?MLD: therapist activated supraclavicular, 5 diaphragmatic breaths,right axillary and inguinal LN's and right axillo inguinal pathway with verbalization to pt while performing, then had pt perform all techniques, ending with LN's. Pt was given written instructions. ? ? ?06/15/2021 ?Pt educated in supine wand flexion and scaption and lower trunk rotation to address pt tightness. Educated to take to gentle stretch and no right  shoulder pain. Discussed depressing scapula prn.  Discussed POC and treatment interventions suggested. ? ?PATIENT EDUCATION:  ?Education details: supine wand flex and scaption, lower trunk rotation with arms outs

## 2021-06-30 ENCOUNTER — Ambulatory Visit (HOSPITAL_COMMUNITY)
Admission: RE | Admit: 2021-06-30 | Discharge: 2021-06-30 | Disposition: A | Discharge status: Home / Self Care | Payer: 59 | Source: Ambulatory Visit | Attending: Adult Health | Admitting: Adult Health

## 2021-06-30 DIAGNOSIS — K7689 Other specified diseases of liver: Secondary | ICD-10-CM | POA: Diagnosis not present

## 2021-06-30 DIAGNOSIS — Z853 Personal history of malignant neoplasm of breast: Secondary | ICD-10-CM | POA: Diagnosis not present

## 2021-06-30 DIAGNOSIS — K769 Liver disease, unspecified: Secondary | ICD-10-CM | POA: Diagnosis not present

## 2021-06-30 MED ORDER — GADOBUTROL 1 MMOL/ML IV SOLN
6.0000 mL | Freq: Once | INTRAVENOUS | Status: AC | PRN
  Administered 2021-06-30: 6 mL via INTRAVENOUS

## 2021-07-12 ENCOUNTER — Ambulatory Visit: Payer: 59

## 2021-07-19 ENCOUNTER — Ambulatory Visit: Payer: 59 | Attending: Adult Health

## 2021-07-19 DIAGNOSIS — R293 Abnormal posture: Secondary | ICD-10-CM | POA: Insufficient documentation

## 2021-07-19 DIAGNOSIS — C50411 Malignant neoplasm of upper-outer quadrant of right female breast: Secondary | ICD-10-CM | POA: Diagnosis not present

## 2021-07-19 DIAGNOSIS — C50911 Malignant neoplasm of unspecified site of right female breast: Secondary | ICD-10-CM | POA: Diagnosis not present

## 2021-07-19 DIAGNOSIS — R600 Localized edema: Secondary | ICD-10-CM | POA: Diagnosis not present

## 2021-07-19 DIAGNOSIS — Z170 Estrogen receptor positive status [ER+]: Secondary | ICD-10-CM | POA: Insufficient documentation

## 2021-07-19 DIAGNOSIS — Z483 Aftercare following surgery for neoplasm: Secondary | ICD-10-CM | POA: Insufficient documentation

## 2021-07-19 NOTE — Therapy (Signed)
OUTPATIENT PHYSICAL THERAPY ONCOLOGY TREATMENT  Patient Name: Amber [name] MRN: [id number] DOB:Mar 22, 1970, 51 y.o., female Today's Date: 07/19/2021   PT End of Session - 07/19/21 0802     Visit Number 4    Number of Visits 12    Date for PT Re-Evaluation 07/27/21    PT Start Time 0803    PT Stop Time 0843    PT Time Calculation (min) 40 min    Activity Tolerance Patient tolerated treatment well    Behavior During Therapy [facility] for tasks assessed/performed             Past Medical History:  Diagnosis Date   Breast cancer ([place]) 2021   RIGHT   Deviated septum    Extensor tendinitis of foot 07/31/2013   First toe   Malignant neoplasm of upper-outer quadrant of right breast in female, estrogen receptor positive ([place]) 11/24/2019   Personal history of radiation therapy 2022   Sleep apnea    mild sleep apnea- uses CPAP nightly   Past Surgical History:  Procedure Laterality Date   BREAST BIOPSY Left 2019   BREAST BIOPSY Right 11/20/2019   BREAST BIOPSY Left 12/08/2019   BREAST LUMPECTOMY WITH RADIOACTIVE SEED AND SENTINEL LYMPH NODE BIOPSY Right 01/02/2020   Procedure: RIGHT BREAST LUMPECTOMY WITH RADIOACTIVE SEED AND RIGHT AXILLARY SENTINEL LYMPH NODE BIOPSY;  Surgeon: Rolm Bookbinder, MD;  Location: [facility];  Service: General;  Laterality: Right;  PEC BLOCK   DILATION AND EVACUATION  07/14/2001   miscarriage   ENDOMETRIAL ABLATION W/ NOVASURE  2010   Patient Active Problem List   Diagnosis Date Noted   Acute reaction to situational stress 01/26/2021   Preventative health care 01/26/2021   Insomnia 02/02/2020   Genetic testing 12/08/2019   Malignant neoplasm of upper-outer quadrant of right breast in female, estrogen receptor positive ([place]) 11/24/2019   Deviated septum 11/27/2017   Obstructive sleep apnea 11/13/2017     REFERRING PROVIDER: Wilber Bihari, NP  REFERRING DIAGNOSIS; S/p Right Breast Cancer  THERAPY DIAG:  Aftercare following  surgery for neoplasm  Abnormal posture  Localized edema  Malignant neoplasm of upper-outer quadrant of right breast in female, estrogen receptor positive ([place])  Malignant neoplasm of right breast in female, estrogen receptor positive, unspecified site of breast ([place])  ONSET DATE: 01/2021  SUBJECTIVE                                                                                                                                                                                           SUBJECTIVE STATEMENT: I have been doing my stretches and exercises, and the MLD, and  I think the swelling is better. I don't feel as puffy as it did. Pain is 95% better and tightness is 75% better. I still don't sleep on my right side much because it still hurts.   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 Donne Hazel. Radiation ended Jan. 2022   PAIN:  Are you having pain?  NPRS scale: none today, Pain location: right axilla and lateral trunk when present Pain orientation: Right  PAIN TYPE: aching, burning, and tight Pain description: constant  Aggravating factors: laying on right side, reaching activities Relieving factors: progressively getting worse  PRECAUTIONS: Other: s/p Right lumpectomy with SLNB  WEIGHT BEARING RESTRICTIONS No  FALLS:  Has patient fallen in last 6 months? 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  LEISURE: walking, gym exercises  HAND DOMINANCE : right   PRIOR LEVEL OF FUNCTION: Independent  PATIENT GOALS Want fluid to go away, tightness go away, improve mobility, prevent lymphedema in right  UE.   OBJECTIVE  COGNITION:  Overall cognitive status: Within functional limits for tasks assessed   PALPATION: Scar tissue noted under breast and axillary incisions, no breast fibrosis noted. No palpable cords noted, but tightness assosciated with possible cords noted  OBSERVATIONS / OTHER ASSESSMENTS: no visible breast edema, ? Lateral trunk swelling at lateral rib cage however, pt noted to have significant leg length difference/scoliosis with some anterior rib changes  SENSATION:  Light touch: Appears intact    POSTURE: Forw,ard headround shoulders   UPPER EXTREMITY AROM/PROM:  A/PROM RIGHT  06/15/2021  07/19/2021  Shoulder extension 56 55  Shoulder flexion 158, trunk pulling 170  Shoulder abduction 170 trunk/axillary pulling 180  Shoulder internal rotation 65 67  Shoulder external rotation 95 97    (Blank rows = not tested)  A/PROM LEFT  06/15/2021  Shoulder extension 56  Shoulder flexion 165  Shoulder abduction 178  Shoulder internal rotation 75  Shoulder external rotation 94    (Blank rows = not tested)   CERVICAL AROM: All within normal limits:   UPPER EXTREMITY STRENGTH: WFL   LYMPHEDEMA ASSESSMENTS:   SURGERY TYPE/DATE: Right lumpectomy with SLNB 01/02/2020  NUMBER OF LYMPH NODES REMOVED: 5  CHEMOTHERAPY: no  RADIATION:ended 02/2020  HORMONE TREATMENT: yes, tamoxifen  INFECTIONS: no  LYMPHEDEMA ASSESSMENTS:   LANDMARK RIGHT  06/15/2021 07/19/2021  10 cm proximal to olecranon process 25.2 25.3  Olecranon process 22.9 22.5  10 cm proximal to ulnar styloid process 19.1 19.3  Just proximal to ulnar styloid process 14.8 14.7  Across hand at thumb web space 20.1 19.8  At base of 2nd digit 6.0 5.8  (Blank rows = not tested)  LANDMARK LEFT  06/15/2021  10 cm proximal to olecranon process 25.3  Olecranon process 23.1  10 cm proximal to ulnar styloid process 19.0  Just proximal to ulnar styloid process 14.7  Across hand at thumb web space 19.0  At base of  2nd digit 5.8  (Blank rows = not tested)     L-DEX LYMPHEDEMA SCREENING:  The patient was assessed using the L-Dex machine today to produce a lymphedema index baseline score. The patient will be reassessed on a regular basis (typically every 3  months) to obtain new L-Dex scores. If the score is > 6.5 points away from his/her baseline score indicating onset of subclinical lymphedema, it will be recommended to wear a compression garment for 4 weeks, 12 hours per day and then be reassessed. If the score continues to be > 6.5 points from baseline at reassessment, we will initiate lymphedema treatment. Assessing in this manner has a 95% rate of preventing clinically significant lymphedema.      QUICK DASH SURVEY: 11.36   TODAY'S TREATMENT  07/19/2021 Discussed pts current status and remeasured shoulder ROM and arm circumference. Showed pt arm sleeves on [employer] and measured pt for sleeve. Tried a [employer] in clinic but it was too short. Ended up ordering a [employer] Soft size 1 max 20-30 long in [place] on pts personal Credit card. Advised her to contact [employer] to get the code for sleeve so she can file her insurance. Pt is doing very well and feels ready for discharge.    06/28/2021 Soft tissue mobilization to right upper quarter with cocoa butter with emphasis on UT, pectorals, lats, and in SL serratus and periscapular region. PROM to right shoulder with MFR to pectorals and lats for flexion, scaption, d2 flexion and abd. MLD: therapist activated supraclavicular, 5 diaphragmatic breaths,right axillary and inguinal LN's and right axillo inguinal pathway with verbalization to pt while performing, then had pt perform all techniques, ending with LN's Pectoralis wall stretch x 3  06/21/2021 Pulleys for flexion, scaption and abduction x 1:30 ea, ball rolls for flexion x 10, abd x 5 Soft tissue mobilization to right upper quarter with cocoa butter with emphasis on UT, pectorals, lats,  and in SL serratus and periscapular region. PROM to right shoulder with MFR to pectorals and lats for flexion, scaption, d2 flexion and abd. MLD: therapist activated supraclavicular, 5 diaphragmatic breaths,right axillary and inguinal LN's and right axillo inguinal pathway with verbalization to pt while performing, then had pt perform all techniques, ending with LN's. Pt was given written instructions.   06/15/2021 Pt educated in supine wand flexion and scaption and lower trunk rotation to address pt tightness. Educated to take to gentle stretch and no right shoulder pain. Discussed depressing scapula prn.  Discussed POC and treatment interventions suggested.  PATIENT EDUCATION:  Education details: supine wand flex and scaption, lower trunk rotation with arms outstretched Person educated: Patient Education method: Explanation Education comprehension: verbalized understanding   HOME EXERCISE PROGRAM: Supine wand flexion and scaption, lower trunk rotation  ASSESSMENT:  CLINICAL IMPRESSION:  Pt has achieved 5/6 goals established at initial evaluation. Her pain and tightness is substantially better. Her shoulder ROM is now WNL, and she is independent and compliant with her HEP and MLD. She was measured for compression sleeve today and she ordered it with her CC. (Juzo soft size 1 max long compression 20-30, beige) She will contact [employer] to get the code to submit to her insurance. Only goal not achieved is being able to sleep on the right side without pain. She is still having difficulty with this but feels ready to be discharged.    OBJECTIVE IMPAIRMENTS decreased activity tolerance, decreased knowledge of condition, decreased ROM, increased edema, increased fascial restrictions, postural dysfunction, and pain.   ACTIVITY LIMITATIONS  activities requiring use of right UE for reaching, lying on right side. Marland Kitchen   PERSONAL FACTORS 1-2 comorbidities: Right breast cancer s/p radiation   are also affecting patient's functional outcome.    REHAB POTENTIAL: Excellent  CLINICAL DECISION  MAKING: Stable/uncomplicated  EVALUATION COMPLEXITY: Low  GOALS: Goals reviewed with patient? Yes  SHORT TERM GOALS: Target date: 08/09/2021  Pt will be independent with HEP for ROM/flexibility Baseline: Goal status: MET 07/19/2021  2.  Pt will have decreased complaints of tightness/pain by 25% Baseline:  Goal status: MET 07/19/2021      LONG TERM GOALS: Target date: 08/30/2021  Pt will report decreased tightness by 50% or greater Baseline:  Goal status:MET 6/6//2023 2.  Pt will be independent with MLD to right lateral trunk Baseline:  Goal status: MET 07/19/2021 3.  Pt will restore right shoulder ROM to WNL without complaints of tightness/pain Baseline:  Goal status: MET 07/19/2021  4.  Pt will be able to lie on right side to sleep without complaint Baseline:  Goal status: NOT MET    PLAN: PT FREQUENCY: 1-2x/week  PT DURATION: 6 weeks  PLANNED INTERVENTIONS: Therapeutic exercises, Neuromuscular re-education, Patient/Family education, Joint mobilization, Orthotic/Fit training, Dry Needling, Manual lymph drainage, scar mobilization, and Manual therapy  PLAN FOR NEXT SESSION: Pt is discharged to continue with her HEP. PHYSICAL THERAPY DISCHARGE SUMMARY  Visits from Start of Care: 4  Current functional level related to goals / functional outcomes: Pt achieved 5/6 goals established   Remaining deficits: Still not able to sleep well on right side   Education / Equipment: Ordered compression sleeve today   Patient agrees to discharge. Patient goals were partially met. Patient is being discharged due to being pleased with the current functional level.   Claris Pong, PT 07/19/2021, 8:46 AM

## 2021-08-22 ENCOUNTER — Other Ambulatory Visit (HOSPITAL_COMMUNITY): Payer: Self-pay

## 2021-08-22 ENCOUNTER — Telehealth: Payer: Self-pay | Admitting: Nurse Practitioner

## 2021-08-22 DIAGNOSIS — J209 Acute bronchitis, unspecified: Secondary | ICD-10-CM

## 2021-08-22 MED ORDER — PREDNISONE 10 MG PO TABS
ORAL_TABLET | ORAL | 0 refills | Status: DC
  Filled 2021-08-22: qty 20, 8d supply, fill #0

## 2021-08-22 MED ORDER — AZITHROMYCIN 250 MG PO TABS
ORAL_TABLET | ORAL | 0 refills | Status: DC
  Filled 2021-08-22: qty 6, 5d supply, fill #0

## 2021-08-22 NOTE — Telephone Encounter (Signed)
Error

## 2021-08-22 NOTE — Telephone Encounter (Signed)
Pt contacted the office for cough and congestion x 3 weeks. She has had a persistent, dry cough that has been minimally responsive to conservative, over the counter therapies. Denies sputum production, fevers, chills, night sweats, fatigue. Eating and drinking well. Will empirically treat with prednisone taper and z pack. Advised to notify if no improvement or worsening occurs so she can be seen for visit.

## 2021-08-23 ENCOUNTER — Other Ambulatory Visit: Payer: Self-pay

## 2021-08-23 DIAGNOSIS — Z170 Estrogen receptor positive status [ER+]: Secondary | ICD-10-CM

## 2021-08-24 ENCOUNTER — Inpatient Hospital Stay (HOSPITAL_BASED_OUTPATIENT_CLINIC_OR_DEPARTMENT_OTHER): Payer: 59 | Admitting: Hematology and Oncology

## 2021-08-24 ENCOUNTER — Telehealth: Payer: Self-pay | Admitting: Nurse Practitioner

## 2021-08-24 ENCOUNTER — Other Ambulatory Visit: Payer: Self-pay

## 2021-08-24 ENCOUNTER — Inpatient Hospital Stay: Payer: 59 | Attending: Hematology and Oncology

## 2021-08-24 ENCOUNTER — Other Ambulatory Visit (HOSPITAL_COMMUNITY): Payer: Self-pay

## 2021-08-24 VITALS — BP 103/59 | HR 92 | Temp 98.6°F | Resp 16 | Ht 68.0 in | Wt 143.1 lb

## 2021-08-24 DIAGNOSIS — C50411 Malignant neoplasm of upper-outer quadrant of right female breast: Secondary | ICD-10-CM | POA: Diagnosis not present

## 2021-08-24 DIAGNOSIS — G4730 Sleep apnea, unspecified: Secondary | ICD-10-CM | POA: Insufficient documentation

## 2021-08-24 DIAGNOSIS — Z800 Family history of malignant neoplasm of digestive organs: Secondary | ICD-10-CM | POA: Insufficient documentation

## 2021-08-24 DIAGNOSIS — J209 Acute bronchitis, unspecified: Secondary | ICD-10-CM

## 2021-08-24 DIAGNOSIS — Z923 Personal history of irradiation: Secondary | ICD-10-CM | POA: Insufficient documentation

## 2021-08-24 DIAGNOSIS — Z79899 Other long term (current) drug therapy: Secondary | ICD-10-CM | POA: Insufficient documentation

## 2021-08-24 DIAGNOSIS — Z170 Estrogen receptor positive status [ER+]: Secondary | ICD-10-CM | POA: Insufficient documentation

## 2021-08-24 DIAGNOSIS — Z79810 Long term (current) use of selective estrogen receptor modulators (SERMs): Secondary | ICD-10-CM | POA: Diagnosis not present

## 2021-08-24 DIAGNOSIS — Z801 Family history of malignant neoplasm of trachea, bronchus and lung: Secondary | ICD-10-CM | POA: Insufficient documentation

## 2021-08-24 LAB — CBC WITH DIFFERENTIAL (CANCER CENTER ONLY)
Abs Immature Granulocytes: 0.02 10*3/uL (ref 0.00–0.07)
Basophils Absolute: 0 10*3/uL (ref 0.0–0.1)
Basophils Relative: 1 %
Eosinophils Absolute: 0 10*3/uL (ref 0.0–0.5)
Eosinophils Relative: 0 %
HCT: 36.9 % (ref 36.0–46.0)
Hemoglobin: 12.5 g/dL (ref 12.0–15.0)
Immature Granulocytes: 0 %
Lymphocytes Relative: 8 %
Lymphs Abs: 0.5 10*3/uL — ABNORMAL LOW (ref 0.7–4.0)
MCH: 30.3 pg (ref 26.0–34.0)
MCHC: 33.9 g/dL (ref 30.0–36.0)
MCV: 89.3 fL (ref 80.0–100.0)
Monocytes Absolute: 0.2 10*3/uL (ref 0.1–1.0)
Monocytes Relative: 3 %
Neutro Abs: 5.8 10*3/uL (ref 1.7–7.7)
Neutrophils Relative %: 88 %
Platelet Count: 191 10*3/uL (ref 150–400)
RBC: 4.13 MIL/uL (ref 3.87–5.11)
RDW: 12.7 % (ref 11.5–15.5)
WBC Count: 6.6 10*3/uL (ref 4.0–10.5)
nRBC: 0 % (ref 0.0–0.2)

## 2021-08-24 LAB — CMP (CANCER CENTER ONLY)
ALT: 13 U/L (ref 0–44)
AST: 15 U/L (ref 15–41)
Albumin: 4.1 g/dL (ref 3.5–5.0)
Alkaline Phosphatase: 38 U/L (ref 38–126)
Anion gap: 6 (ref 5–15)
BUN: 16 mg/dL (ref 6–20)
CO2: 26 mmol/L (ref 22–32)
Calcium: 8.9 mg/dL (ref 8.9–10.3)
Chloride: 108 mmol/L (ref 98–111)
Creatinine: 0.76 mg/dL (ref 0.44–1.00)
GFR, Estimated: >60 mL/min (ref >60)
Glucose, Bld: 115 mg/dL — ABNORMAL HIGH (ref 70–99)
Potassium: 4 mmol/L (ref 3.5–5.1)
Sodium: 140 mmol/L (ref 135–145)
Total Bilirubin: 0.2 mg/dL — ABNORMAL LOW (ref 0.3–1.2)
Total Protein: 6.7 g/dL (ref 6.5–8.1)

## 2021-08-24 MED ORDER — BENZONATATE 200 MG PO CAPS
200.0000 mg | ORAL_CAPSULE | Freq: Three times a day (TID) | ORAL | 1 refills | Status: DC | PRN
  Filled 2021-08-24: qty 30, 10d supply, fill #0

## 2021-08-24 NOTE — Telephone Encounter (Signed)
Contacted a few days ago and started on prednisone taper and z pack. Some improvement but having persistent cough at night that is keeping her await despite using delsym and chlorpheniramine. Denies fevers, chills, anorexia, shortness of breath, wheeze. Rx tessalon perles 200 mg Three times a day as needed for cough. Will schedule OV if no improvement or symptoms worsen.

## 2021-08-24 NOTE — Progress Notes (Signed)
[place]  [phone] [phone]     ID: Kameran Lallier Harth DOB: 06/17/1970  MR#: [id number]  INO#:[id number]  Patient Care Team: Debbrah Alar, NP as PCP - General (Internal Medicine) Gery Pray, MD as Consulting Physician (Radiation Oncology) Rolm Bookbinder, MD as Consulting Physician (General Surgery) Benay Pike, MD as Consulting Physician (Hematology and Oncology) Delice Bison, Charlestine Massed, NP as Nurse Practitioner (Hematology and Oncology) Benay Pike, MD  CHIEF COMPLAINT: Estrogen receptor positive breast cancer  CURRENT TREATMENT: tamoxifen   INTERVAL HISTORY:  Rhiana returns today for follow up of her estrogen receptor positive breast cancer.  She began tamoxifen on 04/16/2020.   Last mammogram November 2022, stable right lumpectomy site, no other suspicious findings in either breast.  Bilateral diagnostic mammogram in 1 year. Last bone density in November 2022 normal No new concerning complaints.  She has been tolerating that tamoxifen very well. Rest of the pertinent 10 point ROS reviewed and negative    COVID 19 VACCINATION STATUS: fully vaccinated [employer]), with booster 10/2019   HISTORY OF CURRENT ILLNESS: From the original intake note:  Laprecious S Coonrod has a history of bilateral breast biopsies. 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 In 08/2017, a left breast biopsy showed only fibrocystic change.  She had routine screening mammography on 10/28/2019 showing a possible abnormality in the right breast. 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  Accordingly on 11/20/2019 she proceeded to  biopsy of the right breast area in question. The pathology from this procedure ([id number]) showed: invasive ductal carcinoma, grade 1; ductal carcinoma in situ. The prognostic panel results were reported at conference this morning as showing estrogen receptor 95% positive with strong staining, progesterone receptor 50% positive with strong staining, HER-2 not amplified by immunohistochemistry, and MIB-1 of 2%  The patient's subsequent history is as detailed below.   PAST MEDICAL HISTORY: Past Medical History:  Diagnosis Date   Breast cancer ([place]) 2021   RIGHT   Deviated septum    Extensor tendinitis of foot 07/31/2013   First toe   Malignant neoplasm of upper-outer quadrant of right breast in female, estrogen receptor positive ([place]) 11/24/2019   Personal history of radiation therapy 2022   Sleep apnea    mild sleep apnea- uses CPAP nightly   Sleep apnea; on CPAP   PAST SURGICAL HISTORY: Past Surgical History:  Procedure Laterality Date   BREAST BIOPSY Left 2019   BREAST BIOPSY Right 11/20/2019   BREAST BIOPSY Left 12/08/2019   BREAST LUMPECTOMY WITH RADIOACTIVE SEED AND SENTINEL LYMPH NODE BIOPSY Right 01/02/2020   Procedure: RIGHT BREAST LUMPECTOMY WITH RADIOACTIVE SEED AND RIGHT AXILLARY SENTINEL LYMPH NODE BIOPSY;  Surgeon: Rolm Bookbinder, MD;  Location: [facility];  Service: General;  Laterality: Right;  PEC BLOCK   DILATION AND EVACUATION  07/14/2001   miscarriage   ENDOMETRIAL ABLATION W/ NOVASURE  2010    FAMILY HISTORY: Family History  Problem Relation Age of Onset   Colon polyps Mother 52   Arthritis Mother    GER disease Mother    Arthritis Sister        psoriatic arthritis, colits (collagenous)   Breast cancer Paternal Aunt        dx after 76,  triple negative   Lung cancer Maternal Grandfather 75   Colon cancer Neg Hx    Esophageal cancer Neg Hx    Rectal cancer Neg Hx    Stomach cancer Neg Hx   The patient's father died in an accident in  his 28s.  The patient's mother is 63 years old as of October 2021.  The patient has 1 sister, no brothers.  On the paternal side there is an aunt with possible cancer but the patient has very little data.  On the maternal side the patient's mother's father died from lung cancer in the setting of tobacco abuse   GYNECOLOGIC HISTORY:  No LMP recorded. Patient has had an ablation. Menarche: 51 years old Age at first live birth: 51 years old [place] P 3 LMP status post endometrial ablation Contraceptive husband is status post vasectomy HRT no  Hysterectomy?  No BSO?  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  Her husband Sherren Mocha works in [employer] for [employer].  Their children are 17, 15 and 14.  Her 67 year old daughter is already committed to [facility] and has a Consulting civil engineer.  The patient attends [employer] in [place]: In the absence of any documentation to the contrary, the patient's spouse is their HCPOA.    HEALTH MAINTENANCE: Social History   Tobacco Use   Smoking status: Never   Smokeless tobacco: Never  Vaping Use   Vaping Use: Never used  Substance Use Topics   Alcohol use: Not Currently   Drug use: Never     Colonoscopy: n/a (age)  PAP: Up-to-date  Bone density: Never   No Known Allergies  Current Outpatient Medications  Medication Sig Dispense Refill   azithromycin (ZITHROMAX) 250 MG tablet Take 2 tabs on day one then 1 tab daily for four additional days. 6 tablet 0   escitalopram (LEXAPRO) 5 MG tablet Take 1 tablet (5 mg total) by mouth daily. (Patient not taking: Reported on 06/15/2021) 30 tablet 1   ondansetron (ZOFRAN) 4 MG tablet Take 1 tablet (4 mg total) by mouth every 8 (eight) hours as needed for nausea or vomiting. 20 tablet 0   predniSONE (DELTASONE) 10 MG tablet Take 4 tablets by mouth for 2 days, then 3 tabs for 2 days, then 2 tabs for 2 days, then 1 tab for 2 days,  then stop 20 tablet 0   tamoxifen (NOLVADEX) 20 MG tablet Take 20 mg by mouth daily.     tamoxifen (NOLVADEX) 20 MG tablet TAKE 1 TABLET BY MOUTH DAILY 90 tablet 4   zolpidem (AMBIEN) 5 MG tablet TAKE 1 TABLET BY MOUTH AT BEDTIME AS NEEDED FOR SLEEP 30 tablet 1   No current facility-administered medications for this visit.    OBJECTIVE: White woman who appears younger than stated age  There were no vitals filed for this visit.    There is no height or weight on file to calculate BMI.   Wt Readings from Last 3 Encounters:  01/26/21 137 lb 9.6 oz (62.4 kg)  01/19/21 140 lb (63.5 kg)  12/29/20 140 lb (63.5 kg)      ECOG FS:1 - Symptomatic but completely ambulatory  Physical Exam Constitutional:      Appearance: Normal appearance.  Cardiovascular:     Rate and Rhythm: Normal rate and regular rhythm.  Chest:     Comments: Bilateral breast examined.  Right breast lumpectomy scar noted at the  edge of the breast.  No palpable masses or regional adenopathy Musculoskeletal:        General: No swelling.     Cervical back: Normal range of motion and neck supple. No rigidity.  Lymphadenopathy:     Cervical: No cervical adenopathy.  Neurological:     Mental Status: She is alert.       LAB RESULTS:  CMP     Component Value Date/Time   NA 141 08/17/2020 1246   K 4.0 08/17/2020 1246   CL 108 08/17/2020 1246   CO2 27 08/17/2020 1246   GLUCOSE 106 (H) 08/17/2020 1246   BUN 18 08/17/2020 1246   CREATININE 0.75 08/17/2020 1246   CREATININE 0.79 11/26/2019 1547   CREATININE 0.84 11/14/2018 0944   CALCIUM 8.8 (L) 08/17/2020 1246   PROT 6.6 08/17/2020 1246   ALBUMIN 3.6 08/17/2020 1246   AST 18 08/17/2020 1246   AST 17 11/26/2019 1547   ALT 12 08/17/2020 1246   ALT 11 11/26/2019 1547   ALKPHOS 38 08/17/2020 1246   BILITOT 0.4 08/17/2020 1246   BILITOT 0.4 11/26/2019 1547   GFRNONAA >60 08/17/2020 1246   GFRNONAA >60 11/26/2019 1547   GFRNONAA 83 11/14/2018 0944   GFRAA 96  11/14/2018 0944    No results found for: "TOTALPROTELP", "ALBUMINELP", "A1GS", "A2GS", "BETS", "BETA2SER", "GAMS", "MSPIKE", "SPEI"  Lab Results  Component Value Date   WBC 5.0 08/17/2020   NEUTROABS 3.5 08/17/2020   HGB 12.2 08/17/2020   HCT 35.9 (L) 08/17/2020   MCV 89.1 08/17/2020   PLT 173 08/17/2020    No results found for: "LABCA2"  No components found for: "CHENID782"  No results for input(s): "INR" in the last 168 hours.  No results found for: "LABCA2"  No results found for: "UMP536"  No results found for: "CAN125"  No results found for: "CAN153"  No results found for: "CA2729"  No components found for: "HGQUANT"  No results found for: "CEA1", "CEA" / No results found for: "CEA1", "CEA"   No results found for: "AFPTUMOR"  No results found for: "CHROMOGRNA"  No results found for: "KPAFRELGTCHN", "LAMBDASER", "KAPLAMBRATIO" (kappa/lambda light chains)  No results found for: "HGBA", "HGBA2QUANT", "HGBFQUANT", "HGBSQUAN" (Hemoglobinopathy evaluation)   No results found for: "LDH"  Lab Results  Component Value Date   IRON 93 01/26/2020   (Iron and TIBC)  Lab Results  Component Value Date   FERRITIN 94.8 01/26/2020    Urinalysis    Component Value Date/Time   COLORURINE YELLOW 06/17/2018 1540   APPEARANCEUR CLEAR 06/17/2018 1540   LABSPEC <=1.005 (A) 06/17/2018 1540   PHURINE 6.5 06/17/2018 1540   GLUCOSEU NEGATIVE 06/17/2018 1540   HGBUR NEGATIVE 06/17/2018 1540   BILIRUBINUR NEGATIVE 06/17/2018 1540   KETONESUR NEGATIVE 06/17/2018 1540   UROBILINOGEN 0.2 06/17/2018 1540   NITRITE NEGATIVE 06/17/2018 1540   LEUKOCYTESUR NEGATIVE 06/17/2018 1540    STUDIES: No results found.   ELIGIBLE FOR AVAILABLE RESEARCH PROTOCOL: AET  ASSESSMENT: 51 y.o. [place] [facility] woman status post right breast upper outer quadrant biopsy 11/20/2019 for a clinical T1c N0, stage IA invasive ductal carcinoma, grade 1, estrogen and progesterone receptor positive,  HER-2 not amplified, with an MIB-1 of 2%  (1) genetics testing 12/04/2019 through the [employer] Common Hereditary Cancers Panel found no deleterious mutations in APC, ATM, AXIN2, BARD1, BMPR1A, BRCA1, BRCA2, BRIP1, CDH1, CDK4, CDKN2A (p14ARF), CDKN2A (p16INK4a), CHEK2, CTNNA1, DICER1, EPCAM (Deletion/duplication testing only), GREM1 (promoter region deletion/duplication testing only), KIT, MEN1, MLH1, MSH2, MSH3, MSH6,  MUTYH, NBN, NF1, NHTL1, PALB2, PDGFRA, PMS2, POLD1, POLE, PTEN, RAD50, RAD51C, RAD51D, RNF43, SDHB, SDHC, SDHD, SMAD4, SMARCA4. STK11, TP53, TSC1, TSC2, and VHL.  The following genes were evaluated for sequence changes only: SDHA and HOXB13 c.251G>A variant only.  (a) Variant of uncertain significance in ATM at c.5081C>G (p.Ala1694Gly).   (2) status post right lumpectomy and sentinel lymph node sampling 01/02/2020 for a pT1b pN0, stage IA invasive ductal carcinoma, grade 1, with negative margins.  (3) Oncotype score of 16 predicts a risk of recurrence outside the breast in the next 9 years of 4% if the patient's only systemic therapy is antiestrogens for 5 years.  It also predicts no benefit from chemotherapy.  (4) adjuvant radiation  02/09/2020 through 03/09/2020   Site: Right breast Technique: 3D Total Dose (Gy): 40.05/40.05 Dose per Fx (Gy): 2.67 Completed Fx: 15/15 Beam Energies: 6X   Site: Right breast boost Technique: specialPort Total Dose (Gy): 10/10 Dose per Fx (Gy): 2 Completed Fx: 5/5 Beam Energies: 6E, 9E  (5) started tamoxifen 04/13/2020  (a) bone density 11/30/2020  (b) FSH and estradiol level 08/17/2020   PLAN:  Patient is here for follow-up on adjuvant tamoxifen. She is tolerating it extremely well, no adverse effects. No concerning review of systems or physical examination findings.   She is due for mammogram in November 2023, this has been ordered. Bone density October 2022 with normal bone density, Since she is on tamoxifen and has normal bone  density, we do not have to do routine bone density screening every 2 years. She will follow-up with Dr. Donne Hazel and return to clinic with [place] in 1 year  Total encounter time 30 minutes.*  *Total Encounter Time as defined by the Centers for Medicare and Medicaid Services includes, in addition to the face-to-face time of a patient visit (documented in the note above) non-face-to-face time: obtaining and reviewing outside history, ordering and reviewing medications, tests or procedures, care coordination (communications with other health care professionals or caregivers) and documentation in the medical record.

## 2021-10-04 ENCOUNTER — Other Ambulatory Visit (HOSPITAL_COMMUNITY): Payer: Self-pay

## 2021-10-06 ENCOUNTER — Other Ambulatory Visit (HOSPITAL_COMMUNITY): Payer: Self-pay

## 2021-10-24 ENCOUNTER — Ambulatory Visit: Payer: 59

## 2021-12-22 DIAGNOSIS — Z01419 Encounter for gynecological examination (general) (routine) without abnormal findings: Secondary | ICD-10-CM | POA: Diagnosis not present

## 2021-12-22 DIAGNOSIS — Z1389 Encounter for screening for other disorder: Secondary | ICD-10-CM | POA: Diagnosis not present

## 2022-01-03 ENCOUNTER — Other Ambulatory Visit (HOSPITAL_COMMUNITY): Payer: Self-pay

## 2022-01-03 ENCOUNTER — Telehealth: Payer: Self-pay | Admitting: Nurse Practitioner

## 2022-01-03 DIAGNOSIS — H00015 Hordeolum externum left lower eyelid: Secondary | ICD-10-CM

## 2022-01-03 MED ORDER — ERYTHROMYCIN 5 MG/GM OP OINT
1.0000 | TOPICAL_OINTMENT | Freq: Four times a day (QID) | OPHTHALMIC | 0 refills | Status: AC
  Filled 2022-01-03: qty 3.5, 1d supply, fill #0

## 2022-01-03 NOTE — Telephone Encounter (Signed)
01/03/2022: Patient contacted the office with complaints of tender nodule to the left lower eyelid; present x 2 weeks. She has been using warm compresses at home and avoiding eye makeup. No drainage from site. No changes in vision, fevers, chills, facial tenderness/redness. Rx for erythromycin ointment 4x/day for 7 days to affected eye. Instructed to follow up with ophthalmologist should symptoms not improve.

## 2022-01-17 ENCOUNTER — Ambulatory Visit
Admission: RE | Admit: 2022-01-17 | Discharge: 2022-01-17 | Disposition: A | Discharge status: Home / Self Care | Payer: 59 | Source: Ambulatory Visit | Attending: Hematology and Oncology | Admitting: Hematology and Oncology

## 2022-01-17 DIAGNOSIS — R92333 Mammographic heterogeneous density, bilateral breasts: Secondary | ICD-10-CM | POA: Diagnosis not present

## 2022-01-17 DIAGNOSIS — Z170 Estrogen receptor positive status [ER+]: Secondary | ICD-10-CM

## 2022-01-17 DIAGNOSIS — Z853 Personal history of malignant neoplasm of breast: Secondary | ICD-10-CM | POA: Diagnosis not present

## 2022-01-24 ENCOUNTER — Other Ambulatory Visit (HOSPITAL_COMMUNITY): Payer: Self-pay

## 2022-01-31 DIAGNOSIS — C50411 Malignant neoplasm of upper-outer quadrant of right female breast: Secondary | ICD-10-CM | POA: Diagnosis not present

## 2022-01-31 DIAGNOSIS — Z170 Estrogen receptor positive status [ER+]: Secondary | ICD-10-CM | POA: Diagnosis not present

## 2022-02-03 ENCOUNTER — Other Ambulatory Visit (HOSPITAL_COMMUNITY): Payer: Self-pay

## 2022-02-15 DIAGNOSIS — L578 Other skin changes due to chronic exposure to nonionizing radiation: Secondary | ICD-10-CM | POA: Diagnosis not present

## 2022-02-15 DIAGNOSIS — D225 Melanocytic nevi of trunk: Secondary | ICD-10-CM | POA: Diagnosis not present

## 2022-02-15 DIAGNOSIS — D2361 Other benign neoplasm of skin of right upper limb, including shoulder: Secondary | ICD-10-CM | POA: Diagnosis not present

## 2022-02-15 DIAGNOSIS — L814 Other melanin hyperpigmentation: Secondary | ICD-10-CM | POA: Diagnosis not present

## 2022-02-15 DIAGNOSIS — Z129 Encounter for screening for malignant neoplasm, site unspecified: Secondary | ICD-10-CM | POA: Diagnosis not present

## 2022-02-17 ENCOUNTER — Other Ambulatory Visit (HOSPITAL_COMMUNITY): Payer: Self-pay

## 2022-02-17 ENCOUNTER — Telehealth: Payer: Self-pay | Admitting: Nurse Practitioner

## 2022-02-17 DIAGNOSIS — H00015 Hordeolum externum left lower eyelid: Secondary | ICD-10-CM

## 2022-02-17 MED ORDER — TOBRAMYCIN 0.3 % OP SOLN
1.0000 [drp] | Freq: Four times a day (QID) | OPHTHALMIC | 0 refills | Status: DC
Start: 2022-02-17 — End: 2022-11-30
  Filled 2022-02-17: qty 5, 25d supply, fill #0

## 2022-02-17 NOTE — Telephone Encounter (Signed)
02/17/2022: Patient called with persistent hordeolum. She has been treating it with warm compresses and massage. We previously treated her with erythromycin ointment with some improvement but it never fully resolved. She has been experiencing more tenderness to the left lower lid and the area remains inflamed. No changes in vision or ocular pain/redness. Urgent referral placed to ophthalmology for possible I&D. We will treat her with tobramycin eye drops every 6 hours in interim. Medication education provided.

## 2022-03-03 ENCOUNTER — Other Ambulatory Visit (HOSPITAL_COMMUNITY): Payer: Self-pay

## 2022-03-03 DIAGNOSIS — H0015 Chalazion left lower eyelid: Secondary | ICD-10-CM | POA: Diagnosis not present

## 2022-03-03 MED ORDER — NEOMYCIN-POLYMYXIN-DEXAMETH 3.5-10000-0.1 OP OINT
TOPICAL_OINTMENT | OPHTHALMIC | 0 refills | Status: DC
  Filled 2022-03-03: qty 3.5, 7d supply, fill #0

## 2022-04-18 ENCOUNTER — Other Ambulatory Visit (HOSPITAL_COMMUNITY): Payer: Self-pay

## 2022-04-18 ENCOUNTER — Other Ambulatory Visit: Payer: Self-pay | Admitting: Primary Care

## 2022-04-18 MED ORDER — ZOLPIDEM TARTRATE 5 MG PO TABS
ORAL_TABLET | Freq: Every evening | ORAL | 3 refills | Status: DC | PRN
  Filled 2022-04-18: qty 30, 30d supply, fill #0
  Filled 2022-06-01: qty 30, 30d supply, fill #1
  Filled 2022-07-14: qty 30, 30d supply, fill #2
  Filled 2022-09-02: qty 30, 30d supply, fill #3

## 2022-04-18 NOTE — Telephone Encounter (Signed)
Refill sleeping aid provided. Follows with [facility] for management of OSA and insomnia. Due for visit this week.

## 2022-05-11 ENCOUNTER — Other Ambulatory Visit: Payer: Self-pay

## 2022-05-26 ENCOUNTER — Telehealth: Payer: Self-pay | Admitting: Hematology and Oncology

## 2022-05-26 NOTE — Telephone Encounter (Signed)
Spoke with patient confirming upcoming appointment  

## 2022-06-01 ENCOUNTER — Other Ambulatory Visit (HOSPITAL_COMMUNITY): Payer: Self-pay

## 2022-06-01 ENCOUNTER — Other Ambulatory Visit: Payer: Self-pay

## 2022-06-15 ENCOUNTER — Other Ambulatory Visit (HOSPITAL_COMMUNITY): Payer: Self-pay

## 2022-06-15 ENCOUNTER — Telehealth: Payer: Self-pay | Admitting: Nurse Practitioner

## 2022-06-15 DIAGNOSIS — J020 Streptococcal pharyngitis: Secondary | ICD-10-CM

## 2022-06-15 MED ORDER — AZITHROMYCIN 250 MG PO TABS
ORAL_TABLET | ORAL | 0 refills | Status: AC
  Filled 2022-06-15: qty 6, 5d supply, fill #0

## 2022-06-15 NOTE — Telephone Encounter (Signed)
06/15/2022: exposure to two family members positive for strep. Sore/scratchy throat. No other associated symptoms at this time. Rx for azithromycin sent to preferred pharmacy. Medication education provided. Nothing further needed.

## 2022-07-17 ENCOUNTER — Other Ambulatory Visit: Payer: Self-pay

## 2022-08-12 IMAGING — MR MR ABDOMEN WO/W CM
20 series · 48 of 48 positions shown · IV contrast (gadavist)
Comparison: CT chest, 06/15/2021

CLINICAL DATA: Characterize liver lesions incidentally identified
by prior CT of the chest, history of breast cancer

EXAM:
MRI ABDOMEN WITHOUT AND WITH CONTRAST
TECHNIQUE: Multiplanar multisequence MR imaging of the abdomen was performed
both before and after the administration of intravenous contrast.
CONTRAST:  6mL GADAVIST GADOBUTROL 1 MMOL/ML IV SOLN

[Series 2: DWI · axial · 6.0mm · 1.49mm/px · z∈[-119,+133]mm · 2 of 72 slices shown (1 of 2)]
[im 1/72]
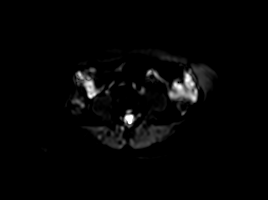
[im 72/72]
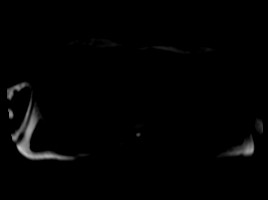

[Series 3: DWI · axial · 6.0mm · 1.49mm/px · 1 of 36 slices shown (2 of 2)]
[im 1/36]
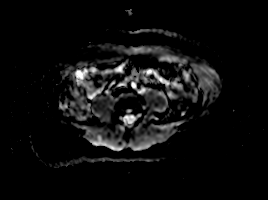

[Series 4: T2 fat-sat · axial · 6.0mm · 1.17mm/px · 1 of 32 slices shown]
[im 1/32]
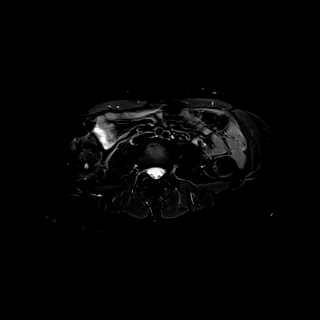

[Series 7: T2 · coronal · 6.0mm · 1.41mm/px · 1 of 30 slices shown (1 of 2)]
[im 1/30]
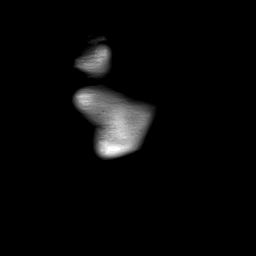

[Series 8: T1 · axial · 3.0mm · 1.14mm/px · z∈[-119,+94]mm · 2 of 72 slices shown (1 of 2)]
[im 1/72]
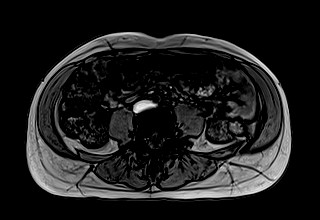
[im 72/72]
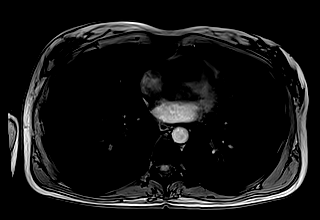

[Series 9: T1 · axial · 3.0mm · 1.14mm/px · z∈[-119,+94]mm · 3 of 72 slices shown (2 of 2)]
[im 1/72]
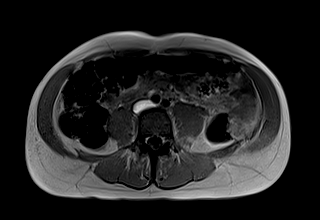
[im 36/72]
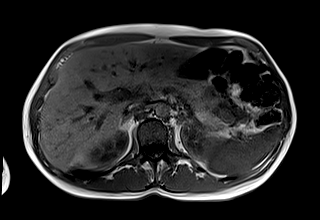
[im 72/72]
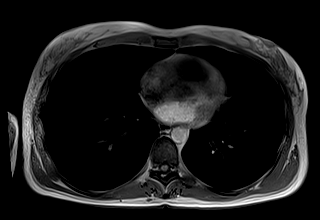

[Series 10: bSSFP · axial · 4.0mm · 0.71mm/px · z∈[-136,+84]mm · 2 of 56 slices shown]
[im 1/56]
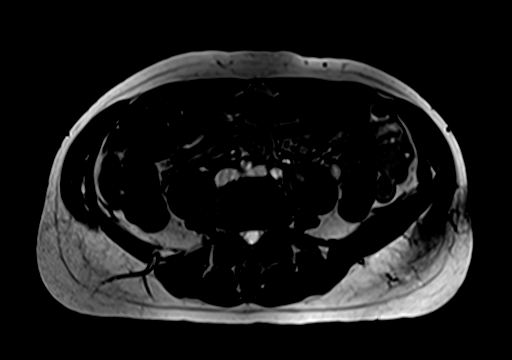
[im 56/56]
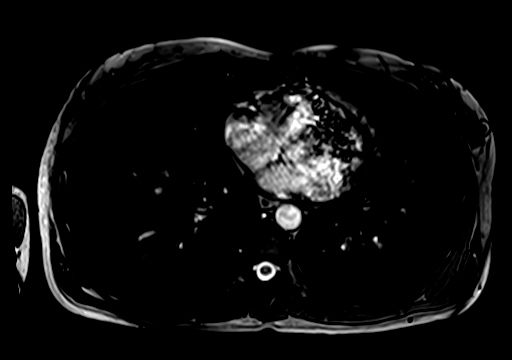

[Series 12: T1 dynamic · axial · 3.0mm · 1.25mm/px · z∈[-149,+88]mm · 3 of 80 slices shown (1 of 12)]
[im 1/80]
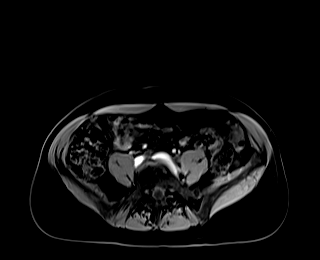
[im 40/80]
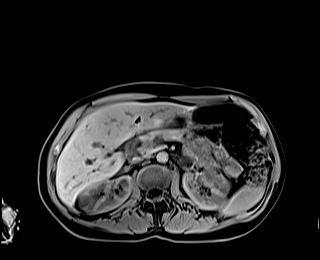
[im 80/80]
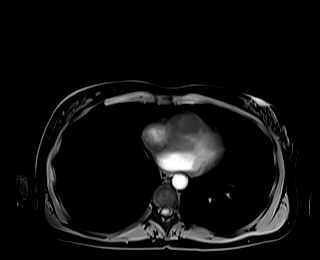

[Series 16: T1 dynamic · axial · 3.0mm · 1.25mm/px · z∈[-149,+88]mm · 3 of 80 slices shown (2 of 12)]
[im 1/80]
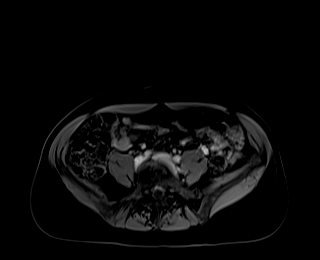
[im 40/80]
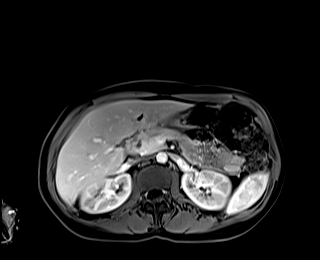
[im 80/80]
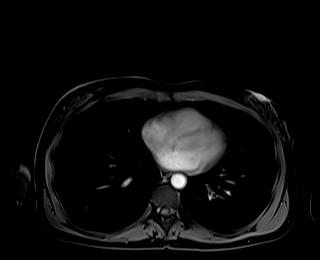

[Series 17: T1 dynamic · axial · 3.0mm · 1.25mm/px · z∈[-149,+88]mm · 3 of 80 slices shown (3 of 12)]
[im 1/80]
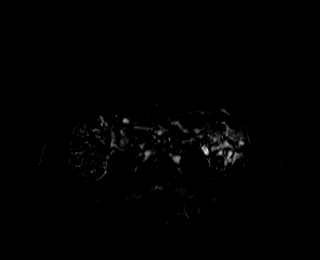
[im 40/80]
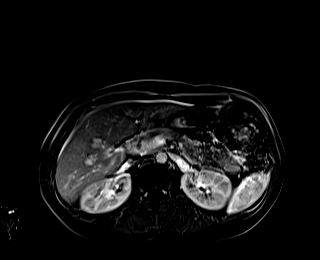
[im 80/80]
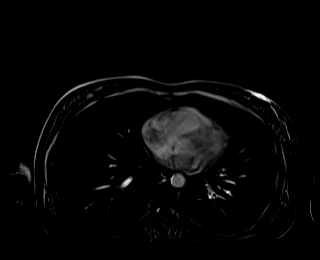

[Series 20: T1 dynamic · axial · 3.0mm · 1.25mm/px · z∈[-149,+88]mm · 3 of 80 slices shown (4 of 12)]
[im 1/80]
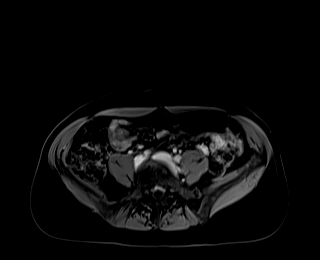
[im 40/80]
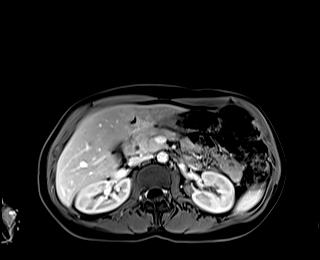
[im 80/80]
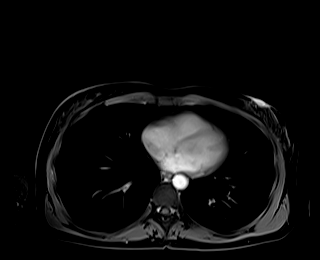

[Series 21: T1 dynamic · axial · 3.0mm · 1.25mm/px · z∈[-149,+88]mm · 3 of 80 slices shown (5 of 12)]
[im 1/80]
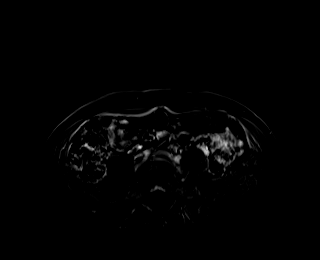
[im 40/80]
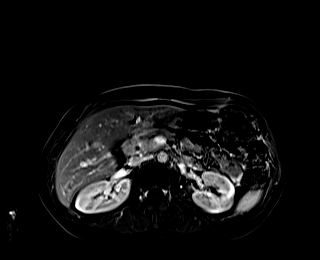
[im 80/80]
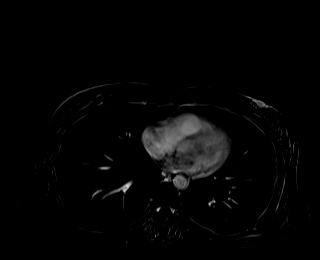

[Series 24: T1 dynamic · axial · 3.0mm · 1.25mm/px · z∈[-149,+88]mm · 3 of 80 slices shown (6 of 12)]
[im 1/80]
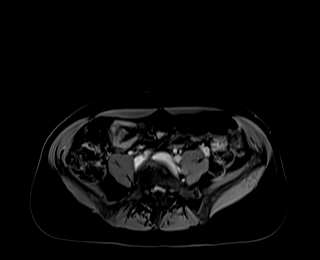
[im 40/80]
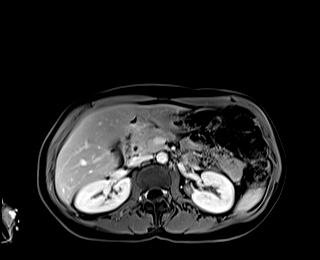
[im 80/80]
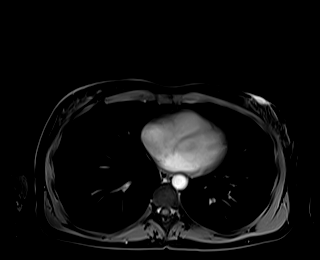

[Series 25: T1 dynamic · axial · 3.0mm · 1.25mm/px · z∈[-149,+88]mm · 3 of 80 slices shown (7 of 12)]
[im 1/80]
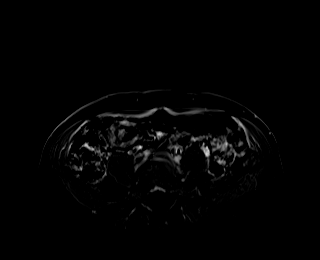
[im 40/80]
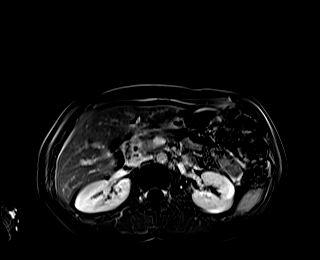
[im 80/80]
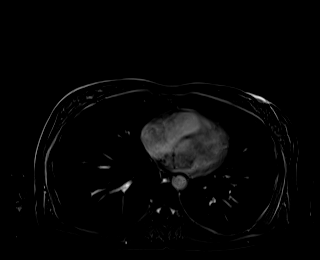

[Series 27: T1 dynamic · coronal · 5.0mm · 1.41mm/px · 2 of 48 slices shown (8 of 12)]
[im 1/48]
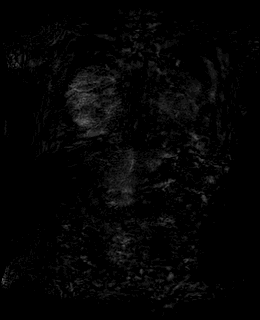
[im 48/48]
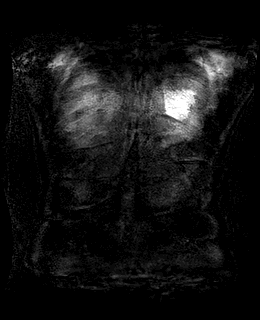

[Series 28: T2 · axial · 6.0mm · 1.43mm/px · 1 of 34 slices shown (2 of 2)]
[im 1/34]
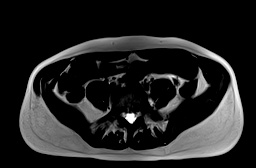

[Series 31: T1 dynamic · axial · 3.0mm · 1.25mm/px · z∈[-149,+88]mm · 3 of 80 slices shown (9 of 12)]
[im 1/80]
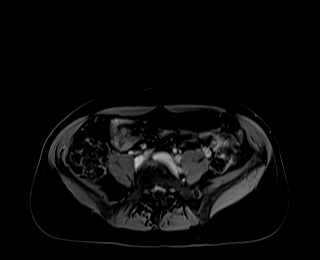
[im 40/80]
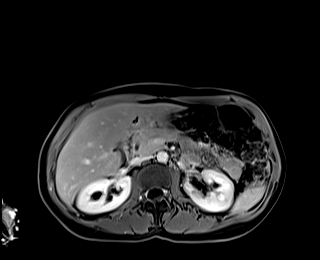
[im 80/80]
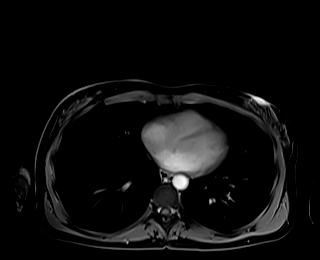

[Series 32: T1 dynamic · axial · 3.0mm · 1.25mm/px · z∈[-149,+88]mm · 3 of 80 slices shown (10 of 12)]
[im 1/80]
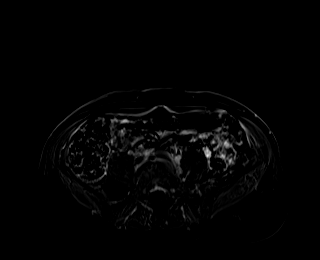
[im 40/80]
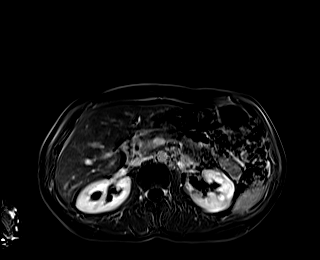
[im 80/80]
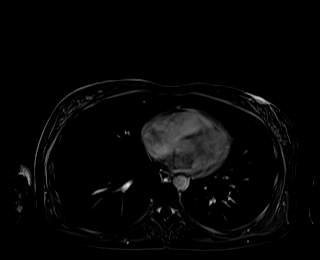

[Series 35: T1 dynamic · axial · 3.0mm · 1.25mm/px · z∈[-149,+88]mm · 3 of 80 slices shown (11 of 12)]
[im 1/80]
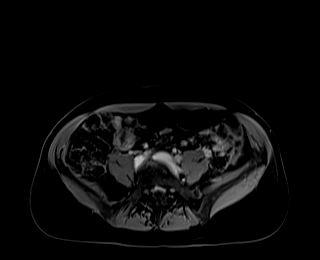
[im 40/80]
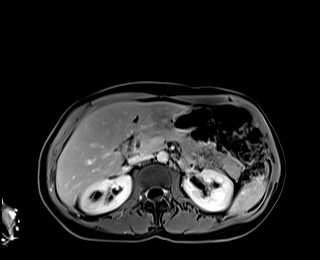
[im 80/80]
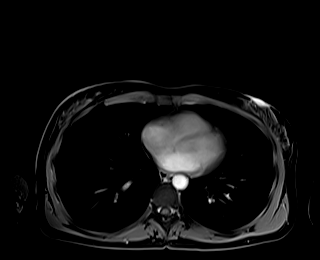

[Series 36: T1 dynamic · axial · 3.0mm · 1.25mm/px · z∈[-149,+88]mm · 3 of 80 slices shown (12 of 12)]
[im 1/80]
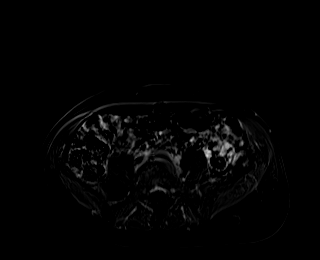
[im 40/80]
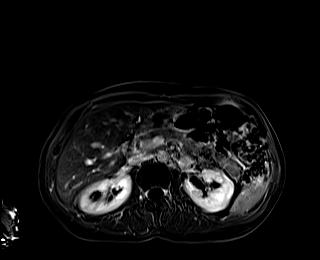
[im 80/80]
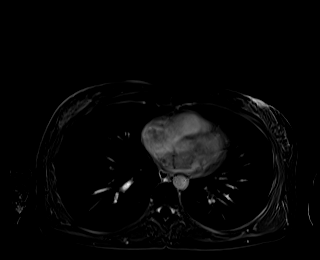

[48 of 48 positions shown; findings below may reference images not displayed]

FINDINGS: Lower chest: No acute findings.

Hepatobiliary: Multiple subcentimeter fluid signal, nonenhancing
cysts scattered throughout the liver, in keeping with findings of
prior CT (e.g. Series 4, image 13). No mass or other parenchymal
abnormality identified. No gallstones. No biliary ductal dilatation.

Pancreas: No mass, inflammatory changes, or other parenchymal
abnormality identified.No pancreatic ductal dilatation.

Spleen:  Within normal limits in size and appearance.

Adrenals/Urinary Tract: Normal adrenal glands. No renal masses or
suspicious contrast enhancement identified. No evidence of
hydronephrosis.

Stomach/Bowel: Large burden of stool in the included colon.
Visualized portions within the abdomen are otherwise unremarkable.

Vascular/Lymphatic: No pathologically enlarged lymph nodes
identified. No abdominal aortic aneurysm demonstrated.

Other:  None.

Musculoskeletal: No suspicious osseous lesions identified.
IMPRESSION: 1. Multiple subcentimeter fluid signal, nonenhancing benign cysts
scattered throughout the liver, in keeping with findings of prior
CT. No suspicious mass or contrast enhancement.
2. No evidence of lymphadenopathy or metastatic disease in the
abdomen.

## 2022-08-15 ENCOUNTER — Other Ambulatory Visit: Payer: Self-pay | Admitting: Family

## 2022-08-16 ENCOUNTER — Other Ambulatory Visit (HOSPITAL_COMMUNITY): Payer: Self-pay

## 2022-08-16 MED ORDER — TAMOXIFEN CITRATE 20 MG PO TABS
20.0000 mg | ORAL_TABLET | Freq: Every day | ORAL | 3 refills | Status: DC
  Filled 2022-08-16 – 2022-08-24 (×2): qty 90, 90d supply, fill #0
  Filled 2022-11-29: qty 90, 90d supply, fill #1
  Filled 2023-03-19: qty 90, 90d supply, fill #2
  Filled 2023-06-27: qty 90, 90d supply, fill #3

## 2022-08-24 ENCOUNTER — Other Ambulatory Visit (HOSPITAL_COMMUNITY): Payer: Self-pay

## 2022-08-30 ENCOUNTER — Ambulatory Visit: Payer: 59 | Admitting: Hematology and Oncology

## 2022-09-04 ENCOUNTER — Encounter: Payer: Self-pay | Admitting: Hematology and Oncology

## 2022-09-04 ENCOUNTER — Other Ambulatory Visit: Payer: Self-pay

## 2022-09-04 ENCOUNTER — Inpatient Hospital Stay: Payer: Commercial Managed Care - PPO | Attending: Hematology and Oncology | Admitting: Hematology and Oncology

## 2022-09-04 VITALS — BP 109/54 | HR 80 | Temp 97.2°F | Resp 16 | Wt 140.0 lb

## 2022-09-04 DIAGNOSIS — M62838 Other muscle spasm: Secondary | ICD-10-CM | POA: Diagnosis not present

## 2022-09-04 DIAGNOSIS — Z170 Estrogen receptor positive status [ER+]: Secondary | ICD-10-CM | POA: Insufficient documentation

## 2022-09-04 DIAGNOSIS — M2550 Pain in unspecified joint: Secondary | ICD-10-CM | POA: Diagnosis not present

## 2022-09-04 DIAGNOSIS — Z7952 Long term (current) use of systemic steroids: Secondary | ICD-10-CM | POA: Diagnosis not present

## 2022-09-04 DIAGNOSIS — Z79899 Other long term (current) drug therapy: Secondary | ICD-10-CM | POA: Diagnosis not present

## 2022-09-04 DIAGNOSIS — Z803 Family history of malignant neoplasm of breast: Secondary | ICD-10-CM | POA: Diagnosis not present

## 2022-09-04 DIAGNOSIS — Z9221 Personal history of antineoplastic chemotherapy: Secondary | ICD-10-CM | POA: Diagnosis not present

## 2022-09-04 DIAGNOSIS — Z79810 Long term (current) use of selective estrogen receptor modulators (SERMs): Secondary | ICD-10-CM | POA: Insufficient documentation

## 2022-09-04 DIAGNOSIS — Z923 Personal history of irradiation: Secondary | ICD-10-CM | POA: Diagnosis not present

## 2022-09-04 DIAGNOSIS — C50411 Malignant neoplasm of upper-outer quadrant of right female breast: Secondary | ICD-10-CM | POA: Diagnosis not present

## 2022-09-04 DIAGNOSIS — Z801 Family history of malignant neoplasm of trachea, bronchus and lung: Secondary | ICD-10-CM | POA: Insufficient documentation

## 2022-09-04 DIAGNOSIS — G4730 Sleep apnea, unspecified: Secondary | ICD-10-CM | POA: Diagnosis not present

## 2022-09-04 NOTE — Progress Notes (Signed)
[facility]  [phone] [phone]     ID: Amber [name] DOB: 11-Nov-1970  MR#: [id number]  NGE#:[id number]  Patient Care Team: Sandford Craze, NP as PCP - General (Internal Medicine) Antony Blackbird, MD as Consulting Physician (Radiation Oncology) Emelia Loron, MD as Consulting Physician (General Surgery) Rachel Moulds, MD as Consulting Physician (Hematology and Oncology) Axel Filler, Larna Daughters, NP as Nurse Practitioner (Hematology and Oncology) Rachel Moulds, MD  CHIEF COMPLAINT: Estrogen receptor positive breast cancer  CURRENT TREATMENT: tamoxifen   INTERVAL HISTORY:  [name] returns today for follow up of her estrogen receptor positive breast cancer.  She began tamoxifen on 04/16/2020.   Last mammogram December 2023, no mammographic evidence for malignancy.  Last bone density in November 2022 normal No new concerning complaints.  She has been tolerating tamoxifen very well.  She however has noticed increased joint pains in almost all her joints and wonders if this could be from the tamoxifen versus menopause.  She also has noticed some muscle spasm in the right chest wall especially with certain movements or when she laughs.  She otherwise stays very active, exercises regularly, eats healthy.  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 Rest of the pertinent 10 point ROS reviewed and negative    COVID 19 VACCINATION STATUS: fully vaccinated [employer]), with booster 10/2019   HISTORY OF CURRENT ILLNESS: From the original intake note:  Amber [name] has a history of bilateral breast biopsies. 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 In 08/2017, a left breast biopsy showed only fibrocystic change.  She had routine screening mammography on 10/28/2019 showing a possible abnormality in the right breast. She underwent right diagnostic mammography with tomography and right  breast ultrasonography at [facility] on 11/11/2019 showing: breast density category C; slight interval increase in size of right breast mass at 10 o'clock, immediately adjacent to the prior biopsy demonstrating PASH, now 1.4 cm; no axillary adenopathy.  Accordingly on 11/20/2019 she proceeded to biopsy of the right breast area in question. The pathology from this procedure ([id number]) showed: invasive ductal carcinoma, grade 1; ductal carcinoma in situ. The prognostic panel results were reported at conference this morning as showing estrogen receptor 95% positive with strong staining, progesterone receptor 50% positive with strong staining, HER-2 not amplified by immunohistochemistry, and MIB-1 of 2%  The patient's subsequent history is as detailed below.   PAST MEDICAL HISTORY: Past Medical History:  Diagnosis Date   Breast cancer (HCC) 2021   RIGHT   Deviated septum    Extensor tendinitis of foot 07/31/2013   First toe   Malignant neoplasm of upper-outer quadrant of right breast in female, estrogen receptor positive (HCC) 11/24/2019   Personal history of radiation therapy 2022   Sleep apnea    mild sleep apnea- uses CPAP nightly   Sleep apnea; on CPAP   PAST SURGICAL HISTORY: Past Surgical History:  Procedure Laterality Date   BREAST BIOPSY Left 2019   BREAST BIOPSY Right 11/20/2019   BREAST BIOPSY Left 12/08/2019   BREAST LUMPECTOMY Right 01/02/2020   BREAST LUMPECTOMY WITH RADIOACTIVE SEED AND SENTINEL LYMPH NODE BIOPSY Right 01/02/2020   Procedure: RIGHT BREAST LUMPECTOMY WITH RADIOACTIVE SEED AND RIGHT AXILLARY SENTINEL LYMPH NODE BIOPSY;  Surgeon: Emelia Loron, MD;  Location: [facility];  Service: General;  Laterality: Right;  PEC BLOCK   DILATION AND EVACUATION  07/14/2001   miscarriage   ENDOMETRIAL ABLATION W/ NOVASURE  2010  FAMILY HISTORY: Family History  Problem Relation Age of Onset   Colon polyps Mother 37   Arthritis Mother    GER  disease Mother    Arthritis Sister        psoriatic arthritis, colits (collagenous)   Breast cancer Paternal Aunt        dx after 40, triple negative   Lung cancer Maternal Grandfather 2   Colon cancer Neg Hx    Esophageal cancer Neg Hx    Rectal cancer Neg Hx    Stomach cancer Neg Hx   The patient's father died in an accident in his 23s.  The patient's mother is 89 years old as of October 2021.  The patient has 1 sister, no brothers.  On the paternal side there is an aunt with possible cancer but the patient has very little data.  On the maternal side the patient's mother's father died from lung cancer in the setting of tobacco abuse   GYNECOLOGIC HISTORY:  No LMP recorded. Patient has had an ablation. Menarche: 52 years old Age at first live birth: 52 years old GX P 3 LMP status post endometrial ablation Contraceptive husband is status post vasectomy HRT no  Hysterectomy?  No BSO?  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  Her husband Amber [name] works in [employer] for [employer].  Their children are 17, 15 and 14.  Her 60 year old daughter is already committed to [facility] and has a Chiropractor.  The patient attends [employer] in [facility]    ADVANCED DIRECTIVES: In the absence of any documentation to the contrary, the patient's spouse is their HCPOA.    HEALTH MAINTENANCE: Social History   Tobacco Use   Smoking status: Never   Smokeless tobacco: Never  Vaping Use   Vaping status: Never Used  Substance Use Topics   Alcohol use: Not Currently   Drug use: Never     Colonoscopy: n/a (age)  PAP: Up-to-date  Bone density: Never   No Known Allergies  Current Outpatient Medications  Medication Sig Dispense Refill   benzonatate (TESSALON) 200 MG capsule Take 1 capsule (200 mg total) by mouth 3 (three) times daily as needed for cough. 30 capsule 1   escitalopram (LEXAPRO) 5 MG tablet Take 1 tablet (5 mg  total) by mouth daily. (Patient not taking: Reported on 06/15/2021) 30 tablet 1   neomycin-polymyxin b-dexamethasone (MAXITROL) 3.5-10000-0.1 OINT Apply into conjunctival sacs of affected eyes 3 times daily as directed 3.5 g 0   ondansetron (ZOFRAN) 4 MG tablet Take 1 tablet (4 mg total) by mouth every 8 (eight) hours as needed for nausea or vomiting. 20 tablet 0   predniSONE (DELTASONE) 10 MG tablet Take 4 tablets by mouth for 2 days, then 3 tabs for 2 days, then 2 tabs for 2 days, then 1 tab for 2 days, then stop 20 tablet 0   tamoxifen (NOLVADEX) 20 MG tablet Take 1 tablet (20 mg) by mouth daily. 90 tablet 3   tobramycin (TOBREX) 0.3 % ophthalmic solution Place 1 drop into the left eye every 6 hours. 5 mL 0   zolpidem (AMBIEN) 5 MG tablet TAKE 1 TABLET BY MOUTH AT BEDTIME AS NEEDED FOR SLEEP 30 tablet 3   No current facility-administered medications for this visit.    OBJECTIVE: White woman who appears younger than stated age  Vitals:   09/04/22 1318  BP: (!) 109/54  Pulse: 80  Resp: 16  Temp: (!) 97.2 F (36.2 C)  SpO2: 95%      Body mass index is 21.29 kg/m.   Wt Readings from Last 3 Encounters:  09/04/22 140 lb (63.5 kg)  08/24/21 143 lb 1.6 oz (64.9 kg)  01/26/21 137 lb 9.6 oz (62.4 kg)      ECOG FS:1 - Symptomatic but completely ambulatory  Physical Exam Constitutional:      Appearance: Normal appearance.  Cardiovascular:     Rate and Rhythm: Normal rate and regular rhythm.  Chest:     Comments: Bilateral breast examined.  Right breast lumpectomy scar noted at the edge of the breast.  No palpable masses or regional adenopathy Musculoskeletal:        General: No swelling.     Cervical back: Normal range of motion and neck supple. No rigidity.  Lymphadenopathy:     Cervical: No cervical adenopathy.  Neurological:     Mental Status: She is alert.       LAB RESULTS:  CMP     Component Value Date/Time   NA 140 08/24/2021 1321   K 4.0 08/24/2021 1321   CL 108  08/24/2021 1321   CO2 26 08/24/2021 1321   GLUCOSE 115 (H) 08/24/2021 1321   BUN 16 08/24/2021 1321   CREATININE 0.76 08/24/2021 1321   CREATININE 0.84 11/14/2018 0944   CALCIUM 8.9 08/24/2021 1321   PROT 6.7 08/24/2021 1321   ALBUMIN 4.1 08/24/2021 1321   AST 15 08/24/2021 1321   ALT 13 08/24/2021 1321   ALKPHOS 38 08/24/2021 1321   BILITOT 0.2 (L) 08/24/2021 1321   GFRNONAA >60 08/24/2021 1321   GFRNONAA 83 11/14/2018 0944   GFRAA 96 11/14/2018 0944    No results found for: "TOTALPROTELP", "ALBUMINELP", "A1GS", "A2GS", "BETS", "BETA2SER", "GAMS", "MSPIKE", "SPEI"  Lab Results  Component Value Date   WBC 6.6 08/24/2021   NEUTROABS 5.8 08/24/2021   HGB 12.5 08/24/2021   HCT 36.9 08/24/2021   MCV 89.3 08/24/2021   PLT 191 08/24/2021    No results found for: "LABCA2"  No components found for: "KGMWNU272"  No results for input(s): "INR" in the last 168 hours.  No results found for: "LABCA2"  No results found for: "ZDG644"  No results found for: "CAN125"  No results found for: "CAN153"  No results found for: "CA2729"  No components found for: "HGQUANT"  No results found for: "CEA1", "CEA" / No results found for: "CEA1", "CEA"   No results found for: "AFPTUMOR"  No results found for: "CHROMOGRNA"  No results found for: "KPAFRELGTCHN", "LAMBDASER", "KAPLAMBRATIO" (kappa/lambda light chains)  No results found for: "HGBA", "HGBA2QUANT", "HGBFQUANT", "HGBSQUAN" (Hemoglobinopathy evaluation)   No results found for: "LDH"  Lab Results  Component Value Date   IRON 93 01/26/2020   (Iron and TIBC)  Lab Results  Component Value Date   FERRITIN 94.8 01/26/2020    Urinalysis    Component Value Date/Time   COLORURINE YELLOW 06/17/2018 1540   APPEARANCEUR CLEAR 06/17/2018 1540   LABSPEC <=1.005 (A) 06/17/2018 1540   PHURINE 6.5 06/17/2018 1540   GLUCOSEU NEGATIVE 06/17/2018 1540   HGBUR NEGATIVE 06/17/2018 1540   BILIRUBINUR NEGATIVE 06/17/2018 1540    KETONESUR NEGATIVE 06/17/2018 1540   UROBILINOGEN 0.2 06/17/2018 1540   NITRITE NEGATIVE 06/17/2018 1540   LEUKOCYTESUR NEGATIVE 06/17/2018 1540    STUDIES: No results found.   ELIGIBLE FOR AVAILABLE RESEARCH PROTOCOL: AET  ASSESSMENT: 52 y.o. [facility] woman status post right breast upper outer quadrant biopsy 11/20/2019  for a clinical T1c N0, stage IA invasive ductal carcinoma, grade 1, estrogen and progesterone receptor positive, HER-2 not amplified, with an MIB-1 of 2%  (1) genetics testing 12/04/2019 through the [employer] Common Hereditary Cancers Panel found no deleterious mutations in APC, ATM, AXIN2, BARD1, BMPR1A, BRCA1, BRCA2, BRIP1, CDH1, CDK4, CDKN2A (p14ARF), CDKN2A (p16INK4a), CHEK2, CTNNA1, DICER1, EPCAM (Deletion/duplication testing only), GREM1 (promoter region deletion/duplication testing only), KIT, MEN1, MLH1, MSH2, MSH3, MSH6, MUTYH, NBN, NF1, NHTL1, PALB2, PDGFRA, PMS2, POLD1, POLE, PTEN, RAD50, RAD51C, RAD51D, RNF43, SDHB, SDHC, SDHD, SMAD4, SMARCA4. STK11, TP53, TSC1, TSC2, and VHL.  The following genes were evaluated for sequence changes only: SDHA and HOXB13 c.251G>A variant only.  (a) Variant of uncertain significance in ATM at c.5081C>G (p.Ala1694Gly).   (2) status post right lumpectomy and sentinel lymph node sampling 01/02/2020 for a pT1b pN0, stage IA invasive ductal carcinoma, grade 1, with negative margins.  (3) Oncotype score of 16 predicts a risk of recurrence outside the breast in the next 9 years of 4% if the patient's only systemic therapy is antiestrogens for 5 years.  It also predicts no benefit from chemotherapy.  (4) adjuvant radiation  02/09/2020 through 03/09/2020   Site: Right breast Technique: 3D Total Dose (Gy): 40.05/40.05 Dose per Fx (Gy): 2.67 Completed Fx: 15/15 Beam Energies: 6X   Site: Right breast boost Technique: specialPort Total Dose (Gy): 10/10 Dose per Fx (Gy): 2 Completed Fx: 5/5 Beam Energies: 6E, 9E  (5) started tamoxifen  04/13/2020  (a) bone density 11/30/2020  (b) FSH and estradiol level 08/17/2020   PLAN:  Patient is here for follow-up on adjuvant tamoxifen. She is tolerating it extremely well, no adverse effects except for diffuse joint pains and some intermittent muscle spasms in the right chest wall. Nodularity in surgical scar is most likely fat necrosis, this has been stable over all. Diffuse joint pains are unlikely related to any arthritis, more than likely this is arthralgia secondary to peri menopausal symptoms as well as tamoxifen. We discussed reducing dose of tamoxifen, she is not interested in it at this time. No concerning review of systems or physical examination findings.   She is due for mammogram in Dec 2024, this has been ordered. Bone density October 2022 with normal bone density, Since she is on tamoxifen and has normal bone density, we do not have to do routine bone density screening every 2 years. She will follow-up with Dr. Dwain Sarna and return to clinic with [place] in 1 year  Total encounter time 30 minutes.*  *Total Encounter Time as defined by the Centers for Medicare and Medicaid Services includes, in addition to the face-to-face time of a patient visit (documented in the note above) non-face-to-face time: obtaining and reviewing outside history, ordering and reviewing medications, tests or procedures, care coordination (communications with other health care professionals or caregivers) and documentation in the medical record.

## 2022-10-23 ENCOUNTER — Telehealth: Payer: Self-pay | Admitting: Nurse Practitioner

## 2022-10-23 DIAGNOSIS — J019 Acute sinusitis, unspecified: Secondary | ICD-10-CM

## 2022-10-23 MED ORDER — AZITHROMYCIN 250 MG PO TABS: ORAL_TABLET | ORAL | 0 refills | Status: DC

## 2022-10-23 NOTE — Telephone Encounter (Signed)
10/23/2022: Pt with cough and nasal congestion with purulent sinus drainage. Failure to improve with conservative treatment. Will send in z pack to pharmacy. Medication education provided. Aware to call if symptoms do not improve.

## 2022-11-28 DIAGNOSIS — M542 Cervicalgia: Secondary | ICD-10-CM | POA: Diagnosis not present

## 2022-11-29 ENCOUNTER — Encounter: Payer: Self-pay | Admitting: Hematology and Oncology

## 2022-11-29 ENCOUNTER — Telehealth: Payer: Self-pay | Admitting: Acute Care

## 2022-11-29 NOTE — Telephone Encounter (Signed)
Amber [name] needs appointment with Amber [name] for CPAP follow up and refills. Thanks so much

## 2022-11-30 ENCOUNTER — Other Ambulatory Visit (HOSPITAL_COMMUNITY): Payer: Self-pay

## 2022-11-30 ENCOUNTER — Ambulatory Visit: Payer: Commercial Managed Care - PPO | Admitting: Primary Care

## 2022-11-30 VITALS — BP 98/60 | HR 78

## 2022-11-30 DIAGNOSIS — F99 Mental disorder, not otherwise specified: Secondary | ICD-10-CM | POA: Diagnosis not present

## 2022-11-30 DIAGNOSIS — F5105 Insomnia due to other mental disorder: Secondary | ICD-10-CM | POA: Diagnosis not present

## 2022-11-30 DIAGNOSIS — G4733 Obstructive sleep apnea (adult) (pediatric): Secondary | ICD-10-CM | POA: Diagnosis not present

## 2022-11-30 MED ORDER — ZOLPIDEM TARTRATE 5 MG PO TABS
5.0000 mg | ORAL_TABLET | Freq: Every evening | ORAL | 1 refills | Status: DC | PRN
  Filled 2022-11-30: qty 90, 90d supply, fill #0
  Filled 2023-03-19: qty 90, 90d supply, fill #1

## 2022-11-30 NOTE — Assessment & Plan Note (Addendum)
-   Chronic; Struggles to initiate sleep at times. She is compliant with CPAP. She takes 5mg  Ambien several nights a week with good results. No residual daytime glossiness. Educated on safe use. Advised she aim to get 6-8 hours of sleep per night. No changes needed today/refill provided.

## 2022-11-30 NOTE — Progress Notes (Signed)
@Patient  ID: Amber [name], female    DOB: 1970-08-05, 52 y.o.   MRN: [id number]  No chief complaint on file.   Referring provider: Sandford Craze, NP  HPI: 52 year old female, never smoked. PMH significant for OSA, malignant neoplasm right breast. Patient of Dr. Maple Hudson, last seen by him on 11/13/17. HST on 11/12/17 showed moderate OSA, AHI 17.2 with SpO2 low 80%. Maintained on AUTO CPAP at 4-8cm h20.  Previous [facility] pulmonary encounter: 02/02/2020 Patient presents today for follow-up OSA on CPAP. 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 Dwain Sarna which was negative for lymph involvement and she had clear margins. She is awaiting oncotype testing to determine if she will need to undergo chemotherapy. She saw Dr. Roselind Messier with rad/onc on 01/29/20, planning for 4 weeks radiation treatment to start December 27th or 28th. She underwent CT simulation on 01/29/20. Hgb has been trending down, follow up CBC is stable and iron studies wnl. PCP ordered IFOB, if positive will need colonoscopy.   She is moderately compliant with CPAP. No issues with mask fit or pressure setting. Current pressure setting 4-8cm h20. She is wearing nasal mask. 90 day download showed 62% compliance > 4 hours. She often has trouble falling asleep at night d/t her mind racing. Bedtime is typically around midnight. She gets on average 5 hours of sleep at night. She does not have significant residual daytime fatigue. Drinks 2 cups of coffee during the day. DME company is [employer], she owns her CPAP machine.   Airview download 11/04/19- 02/01/20: Usage 67/90 days (74%); 56 days (62%) > 4 hours Average usage days used 5 hours 8 mins Pressure 4-8cm h20 (6.5cm h20 - 95%) Airleaks 0.1L/min AHI 0.2  02/11/2021 Patient presents today for annual OSA follow-up. She is doing well. No acute complaints. No issues with mask fit or  pressure setting. She takes [place] sparingly with improvement in occasional insomnia symptoms. No residual daytime sleepiness. She wears nasal sling mask.   Airview download 01/12/21-01/11/21 Usage 27/30 days used; 23 days (77%) > 4 hours Average usage 4 hours 43 mins Pressure 4-8cm h20 (6.5cm h20- 95%) Airleaks 0.1L/min  AHI 0.2   11/30/2022- Interim hx  Patient presents today for follow-up OSA/ insomnia. She is compliant with CPAP use and reports benefit from wearing. Average usage 73% last 90 days. Current pressure 4-8cm h20. She has been having some difficulty sleeping at night due to new neck pain. She is scheduled for MRI neck next week. She takes 5mg  Ambien sporadically to help with sleep onset insomnia with good results, she ran out of prescription and needs refill. No concern for medication misuse. Reviewed safety. No daytime grogginess or fatigue.   Airview download 09/01/22-11/29/22 Usage 75/90; 66 dasy (73%) > 4 hours Average usage days used 5 hours 40 mins Pressure 4-8cm h20  Airleaks 0.1L/min (95%) AHI 0.3   No Known Allergies  Immunization History  Administered Date(s) Administered   Influenza,inj,Quad PF,6+ Mos 11/28/2019   Influenza-Unspecified 11/27/2020   PFIZER(Purple Top)SARS-COV-2 Vaccination 02/10/2019, 03/03/2019, 11/07/2019   Tdap 11/09/2015    Past Medical History:  Diagnosis Date   Breast cancer (HCC) 2021   RIGHT   Deviated septum    Extensor tendinitis of foot 07/31/2013   First toe   Malignant neoplasm of upper-outer quadrant of right breast in female, estrogen receptor positive (HCC) 11/24/2019   Personal history of radiation therapy 2022  Sleep apnea    mild sleep apnea- uses CPAP nightly    Tobacco History: Social History   Tobacco Use  Smoking Status Never  Smokeless Tobacco Never   Counseling given: Not Answered   Outpatient Medications Prior to Visit  Medication Sig Dispense Refill   azithromycin (ZITHROMAX) 250 MG tablet Take  2 tablets on day one then take 1 tablet daily for four additional days 6 tablet 0   benzonatate (TESSALON) 200 MG capsule Take 1 capsule (200 mg total) by mouth 3 (three) times daily as needed for cough. 30 capsule 1   escitalopram (LEXAPRO) 5 MG tablet Take 1 tablet (5 mg total) by mouth daily. (Patient not taking: Reported on 06/15/2021) 30 tablet 1   neomycin-polymyxin b-dexamethasone (MAXITROL) 3.5-10000-0.1 OINT Apply into conjunctival sacs of affected eyes 3 times daily as directed 3.5 g 0   ondansetron (ZOFRAN) 4 MG tablet Take 1 tablet (4 mg total) by mouth every 8 (eight) hours as needed for nausea or vomiting. 20 tablet 0   predniSONE (DELTASONE) 10 MG tablet Take 4 tablets by mouth for 2 days, then 3 tabs for 2 days, then 2 tabs for 2 days, then 1 tab for 2 days, then stop 20 tablet 0   tamoxifen (NOLVADEX) 20 MG tablet Take 1 tablet (20 mg) by mouth daily. 90 tablet 3   tobramycin (TOBREX) 0.3 % ophthalmic solution Place 1 drop into the left eye every 6 hours. 5 mL 0   zolpidem (AMBIEN) 5 MG tablet TAKE 1 TABLET BY MOUTH AT BEDTIME AS NEEDED FOR SLEEP 30 tablet 3   No facility-administered medications prior to visit.   Review of Systems  Review of Systems  Constitutional: Negative.   Respiratory: Negative.    Musculoskeletal:  Positive for neck pain.  Psychiatric/Behavioral:  Positive for sleep disturbance.     Physical Exam  There were no vitals taken for this visit. Physical Exam Constitutional:      Appearance: Normal appearance.  Cardiovascular:     Rate and Rhythm: Normal rate.  Pulmonary:     Effort: Pulmonary effort is normal.  Neurological:     General: No focal deficit present.     Mental Status: She is alert and oriented to person, place, and time. Mental status is at baseline.  Psychiatric:        Mood and Affect: Mood normal.        Behavior: Behavior normal.        Thought Content: Thought content normal.        Judgment: Judgment normal.      Lab  Results:  CBC    Component Value Date/Time   WBC 6.6 08/24/2021 1321   WBC 5.0 08/17/2020 1246   RBC 4.13 08/24/2021 1321   HGB 12.5 08/24/2021 1321   HCT 36.9 08/24/2021 1321   PLT 191 08/24/2021 1321   MCV 89.3 08/24/2021 1321   MCH 30.3 08/24/2021 1321   MCHC 33.9 08/24/2021 1321   RDW 12.7 08/24/2021 1321   LYMPHSABS 0.5 (L) 08/24/2021 1321   MONOABS 0.2 08/24/2021 1321   EOSABS 0.0 08/24/2021 1321   BASOSABS 0.0 08/24/2021 1321    BMET    Component Value Date/Time   NA 140 08/24/2021 1321   K 4.0 08/24/2021 1321   CL 108 08/24/2021 1321   CO2 26 08/24/2021 1321   GLUCOSE 115 (H) 08/24/2021 1321   BUN 16 08/24/2021 1321   CREATININE 0.76 08/24/2021 1321   CREATININE 0.84 11/14/2018 0944  CALCIUM 8.9 08/24/2021 1321   GFRNONAA >60 08/24/2021 1321   GFRNONAA 83 11/14/2018 0944   GFRAA 96 11/14/2018 0944    BNP No results found for: "BNP"  ProBNP No results found for: "PROBNP"  Imaging: No results found.   Assessment & Plan:   Obstructive sleep apnea - Mild OSA is well controlled on auto CPAP - Home sleep study in September 2019 >> AHI 17.2/hour with SpO2 low 80% (average 95%), Weight 135lbs. - She is compliant with usage and reports benefit from wearing - Current pressure 4-8cm h20; Residual AHI 0.3/hour - Encourage side sleeping position, maintain normal BMI range and advised against driving if tired  - Renew CPAP supplies  - FU in 1 year or sooner if needed  Insomnia - Chronic; Struggles to initiate sleep at times. She is compliant with CPAP. She takes 5mg  Ambien several nights a week with good results. No residual daytime glossiness. Educated on safe use. Advised she aim to get 6-8 hours of sleep per night. No changes needed today/refill provided.    Glenford Bayley, NP 11/30/2022

## 2022-11-30 NOTE — Assessment & Plan Note (Signed)
-   Mild OSA is well controlled on auto CPAP - Home sleep study in September 2019 >> AHI 17.2/hour with SpO2 low 80% (average 95%), Weight 135lbs. - She is compliant with usage and reports benefit from wearing - Current pressure 4-8cm h20; Residual AHI 0.3/hour - Encourage side sleeping position, maintain normal BMI range and advised against driving if tired  - Renew CPAP supplies  - FU in 1 year or sooner if needed

## 2022-12-01 ENCOUNTER — Encounter: Payer: Self-pay | Admitting: Medical

## 2022-12-04 ENCOUNTER — Encounter: Payer: Self-pay | Admitting: Primary Care

## 2022-12-05 DIAGNOSIS — M542 Cervicalgia: Secondary | ICD-10-CM | POA: Diagnosis not present

## 2022-12-13 DIAGNOSIS — H52223 Regular astigmatism, bilateral: Secondary | ICD-10-CM | POA: Diagnosis not present

## 2022-12-13 DIAGNOSIS — H5213 Myopia, bilateral: Secondary | ICD-10-CM | POA: Diagnosis not present

## 2022-12-13 NOTE — Telephone Encounter (Signed)
Lakia was seen by Buelah Manis NP on 10/17.  Nothing further needed.

## 2022-12-15 DIAGNOSIS — M542 Cervicalgia: Secondary | ICD-10-CM | POA: Diagnosis not present

## 2022-12-15 DIAGNOSIS — M7918 Myalgia, other site: Secondary | ICD-10-CM | POA: Diagnosis not present

## 2022-12-21 ENCOUNTER — Encounter: Payer: Self-pay | Admitting: Hematology and Oncology

## 2022-12-22 DIAGNOSIS — G4733 Obstructive sleep apnea (adult) (pediatric): Secondary | ICD-10-CM | POA: Diagnosis not present

## 2023-01-17 DIAGNOSIS — Z01419 Encounter for gynecological examination (general) (routine) without abnormal findings: Secondary | ICD-10-CM | POA: Diagnosis not present

## 2023-01-17 DIAGNOSIS — Z853 Personal history of malignant neoplasm of breast: Secondary | ICD-10-CM | POA: Diagnosis not present

## 2023-01-17 DIAGNOSIS — Z1389 Encounter for screening for other disorder: Secondary | ICD-10-CM | POA: Diagnosis not present

## 2023-01-17 DIAGNOSIS — Z130 Encounter for screening for diseases of the blood and blood-forming organs and certain disorders involving the immune mechanism: Secondary | ICD-10-CM | POA: Diagnosis not present

## 2023-01-19 ENCOUNTER — Ambulatory Visit
Admission: RE | Admit: 2023-01-19 | Discharge: 2023-01-19 | Disposition: A | Discharge status: Home / Self Care | Payer: Commercial Managed Care - PPO | Source: Ambulatory Visit | Attending: Hematology and Oncology | Admitting: Hematology and Oncology

## 2023-01-19 DIAGNOSIS — C50411 Malignant neoplasm of upper-outer quadrant of right female breast: Secondary | ICD-10-CM

## 2023-01-19 DIAGNOSIS — Z853 Personal history of malignant neoplasm of breast: Secondary | ICD-10-CM | POA: Diagnosis not present

## 2023-01-21 DIAGNOSIS — G4733 Obstructive sleep apnea (adult) (pediatric): Secondary | ICD-10-CM | POA: Diagnosis not present

## 2023-02-15 ENCOUNTER — Telehealth: Payer: Self-pay | Admitting: Nurse Practitioner

## 2023-02-15 ENCOUNTER — Other Ambulatory Visit (HOSPITAL_COMMUNITY): Payer: Self-pay

## 2023-02-15 DIAGNOSIS — R110 Nausea: Secondary | ICD-10-CM

## 2023-02-15 MED ORDER — ONDANSETRON HCL 4 MG PO TABS
4.0000 mg | ORAL_TABLET | Freq: Three times a day (TID) | ORAL | 0 refills | Status: DC | PRN
  Filled 2023-02-15: qty 30, 10d supply, fill #0

## 2023-02-15 NOTE — Telephone Encounter (Signed)
 02/14/2022: Pt contacted the office. She has been struggling with nausea over the past 24 hours. No emesis, changes in bowel habits, dizziness. Ondansetron  4 mg every 8 hr PRN nausea sent to pharmacy on file. Encouraged to push fluids and remain hydrated. Notify or seek emergency care if symptoms worsen/fail to improve.

## 2023-02-16 ENCOUNTER — Other Ambulatory Visit (HOSPITAL_COMMUNITY): Payer: Self-pay

## 2023-02-21 DIAGNOSIS — G4733 Obstructive sleep apnea (adult) (pediatric): Secondary | ICD-10-CM | POA: Diagnosis not present

## 2023-03-06 DIAGNOSIS — Z170 Estrogen receptor positive status [ER+]: Secondary | ICD-10-CM | POA: Diagnosis not present

## 2023-03-06 DIAGNOSIS — C50411 Malignant neoplasm of upper-outer quadrant of right female breast: Secondary | ICD-10-CM | POA: Diagnosis not present

## 2023-03-14 DIAGNOSIS — B351 Tinea unguium: Secondary | ICD-10-CM | POA: Diagnosis not present

## 2023-03-14 DIAGNOSIS — Z129 Encounter for screening for malignant neoplasm, site unspecified: Secondary | ICD-10-CM | POA: Diagnosis not present

## 2023-03-14 DIAGNOSIS — L578 Other skin changes due to chronic exposure to nonionizing radiation: Secondary | ICD-10-CM | POA: Diagnosis not present

## 2023-03-14 DIAGNOSIS — D369 Benign neoplasm, unspecified site: Secondary | ICD-10-CM | POA: Diagnosis not present

## 2023-03-14 DIAGNOSIS — D2361 Other benign neoplasm of skin of right upper limb, including shoulder: Secondary | ICD-10-CM | POA: Diagnosis not present

## 2023-03-14 DIAGNOSIS — D2371 Other benign neoplasm of skin of right lower limb, including hip: Secondary | ICD-10-CM | POA: Diagnosis not present

## 2023-03-19 ENCOUNTER — Other Ambulatory Visit: Payer: Self-pay

## 2023-03-30 ENCOUNTER — Other Ambulatory Visit (HOSPITAL_COMMUNITY): Payer: Self-pay

## 2023-03-30 ENCOUNTER — Other Ambulatory Visit (HOSPITAL_BASED_OUTPATIENT_CLINIC_OR_DEPARTMENT_OTHER): Payer: Self-pay | Admitting: Primary Care

## 2023-03-30 MED ORDER — AZITHROMYCIN 250 MG PO TABS
ORAL_TABLET | ORAL | 0 refills | Status: DC
  Filled 2023-03-30 – 2023-05-24 (×2): qty 6, 5d supply, fill #0

## 2023-03-30 NOTE — Progress Notes (Signed)
Patient having URI symptoms, zpack sent

## 2023-04-09 ENCOUNTER — Other Ambulatory Visit (HOSPITAL_COMMUNITY): Payer: Self-pay

## 2023-05-24 ENCOUNTER — Other Ambulatory Visit: Payer: Self-pay

## 2023-06-04 ENCOUNTER — Other Ambulatory Visit: Payer: Self-pay | Admitting: *Deleted

## 2023-06-04 DIAGNOSIS — G4733 Obstructive sleep apnea (adult) (pediatric): Secondary | ICD-10-CM

## 2023-06-05 NOTE — Addendum Note (Signed)
 Addended by: Shelle Devon on: 06/05/2023 08:26 AM   Modules accepted: Orders

## 2023-06-11 ENCOUNTER — Telehealth: Payer: Self-pay | Admitting: Primary Care

## 2023-06-11 ENCOUNTER — Other Ambulatory Visit (HOSPITAL_COMMUNITY): Payer: Self-pay

## 2023-06-11 MED ORDER — TOBRAMYCIN 0.3 % OP SOLN
1.0000 [drp] | OPHTHALMIC | 0 refills | Status: DC
  Filled 2023-06-11: qty 5, 17d supply, fill #0

## 2023-06-11 MED ORDER — ZOLPIDEM TARTRATE 5 MG PO TABS
5.0000 mg | ORAL_TABLET | Freq: Every evening | ORAL | 1 refills | Status: AC | PRN
  Filled 2023-06-11 – 2023-06-27 (×2): qty 90, 90d supply, fill #0
  Filled 2023-11-21: qty 90, 90d supply, fill #1

## 2023-06-11 NOTE — Telephone Encounter (Signed)
 Recent CPAP download reviewed, patient is compliant with use. Needs new machine as current one is > 5 years and a part is broken, DME order has been placed. Rx Tobrex  0.3% 1 gtt right eye every 4 days for eye infections. Refill Ambien .

## 2023-06-13 ENCOUNTER — Other Ambulatory Visit (HOSPITAL_COMMUNITY): Payer: Self-pay

## 2023-06-13 ENCOUNTER — Other Ambulatory Visit: Payer: Self-pay | Admitting: Nurse Practitioner

## 2023-06-13 DIAGNOSIS — H00012 Hordeolum externum right lower eyelid: Secondary | ICD-10-CM

## 2023-06-13 MED ORDER — ERYTHROMYCIN 5 MG/GM OP OINT
1.0000 | TOPICAL_OINTMENT | Freq: Every day | OPHTHALMIC | 0 refills | Status: AC
  Filled 2023-06-13: qty 7, 7d supply, fill #0

## 2023-06-13 NOTE — Progress Notes (Signed)
 06/13/2023: Pt with hordeolum of right lower eyelid. Utilizing warm compresses for treatment. Ongoing x 1 week. Will send in erythromycin  ointment to use at bedtime and up to 3 times during the day. She will set up appt with optho for potential I&D if symptoms worsen of fail to improve. No vision changes, drainage, swelling of the orbital socket/face, fevers.

## 2023-06-21 ENCOUNTER — Other Ambulatory Visit: Payer: Self-pay | Admitting: Primary Care

## 2023-06-21 DIAGNOSIS — M2550 Pain in unspecified joint: Secondary | ICD-10-CM

## 2023-06-21 NOTE — Progress Notes (Signed)
 C/o persistent joint pain in hips. Family history of autoimmune disease. Patient is on tamoxifen . Checking basic serology/inflammatory markers

## 2023-06-27 ENCOUNTER — Other Ambulatory Visit: Payer: Self-pay

## 2023-06-27 ENCOUNTER — Other Ambulatory Visit (INDEPENDENT_AMBULATORY_CARE_PROVIDER_SITE_OTHER)

## 2023-06-27 ENCOUNTER — Other Ambulatory Visit (HOSPITAL_COMMUNITY): Payer: Self-pay

## 2023-06-27 DIAGNOSIS — M2550 Pain in unspecified joint: Secondary | ICD-10-CM

## 2023-06-27 LAB — CBC WITH DIFFERENTIAL/PLATELET
Basophils Absolute: 0 10*3/uL (ref 0.0–0.1)
Basophils Relative: 0.8 % (ref 0.0–3.0)
Eosinophils Absolute: 0.1 10*3/uL (ref 0.0–0.7)
Eosinophils Relative: 1.5 % (ref 0.0–5.0)
HCT: 39.9 % (ref 36.0–46.0)
Hemoglobin: 13.3 g/dL (ref 12.0–15.0)
Lymphocytes Relative: 25.2 % (ref 12.0–46.0)
Lymphs Abs: 1.2 10*3/uL (ref 0.7–4.0)
MCHC: 33.2 g/dL (ref 30.0–36.0)
MCV: 89.1 fl (ref 78.0–100.0)
Monocytes Absolute: 0.5 10*3/uL (ref 0.1–1.0)
Monocytes Relative: 10.7 % (ref 3.0–12.0)
Neutro Abs: 2.9 10*3/uL (ref 1.4–7.7)
Neutrophils Relative %: 61.8 % (ref 43.0–77.0)
Platelets: 226 10*3/uL (ref 150.0–400.0)
RBC: 4.48 Mil/uL (ref 3.87–5.11)
RDW: 13.2 % (ref 11.5–15.5)
WBC: 4.7 10*3/uL (ref 4.0–10.5)

## 2023-06-27 LAB — C-REACTIVE PROTEIN: CRP: <1 mg/dL (ref 0.5–20.0)

## 2023-06-27 LAB — SEDIMENTATION RATE: Sed Rate: 4 mm/h (ref 0–30)

## 2023-06-29 LAB — ANTI-NUCLEAR AB-TITER (ANA TITER): ANA Titer 1: 1:40 {titer} — ABNORMAL HIGH

## 2023-06-29 LAB — RHEUMATOID FACTOR: Rheumatoid fact SerPl-aCnc: <10 [IU]/mL (ref <14)

## 2023-06-29 LAB — ANA: Anti Nuclear Antibody (ANA): POSITIVE — AB

## 2023-07-23 ENCOUNTER — Other Ambulatory Visit: Payer: Self-pay | Admitting: *Deleted

## 2023-07-23 DIAGNOSIS — G4733 Obstructive sleep apnea (adult) (pediatric): Secondary | ICD-10-CM

## 2023-08-01 ENCOUNTER — Ambulatory Visit: Admitting: Family

## 2023-08-01 ENCOUNTER — Telehealth: Payer: Self-pay | Admitting: Family

## 2023-08-01 ENCOUNTER — Other Ambulatory Visit (HOSPITAL_COMMUNITY): Payer: Self-pay

## 2023-08-01 VITALS — BP 102/32 | HR 72 | Temp 98.2°F | Resp 16 | Ht 68.0 in | Wt 137.0 lb

## 2023-08-01 DIAGNOSIS — G4733 Obstructive sleep apnea (adult) (pediatric): Secondary | ICD-10-CM | POA: Diagnosis not present

## 2023-08-01 DIAGNOSIS — R768 Other specified abnormal immunological findings in serum: Secondary | ICD-10-CM | POA: Diagnosis not present

## 2023-08-01 DIAGNOSIS — Z0000 Encounter for general adult medical examination without abnormal findings: Secondary | ICD-10-CM | POA: Diagnosis not present

## 2023-08-01 DIAGNOSIS — R41840 Attention and concentration deficit: Secondary | ICD-10-CM

## 2023-08-01 DIAGNOSIS — M2550 Pain in unspecified joint: Secondary | ICD-10-CM | POA: Diagnosis not present

## 2023-08-01 DIAGNOSIS — Z23 Encounter for immunization: Secondary | ICD-10-CM

## 2023-08-01 DIAGNOSIS — N951 Menopausal and female climacteric states: Secondary | ICD-10-CM | POA: Diagnosis not present

## 2023-08-01 LAB — LIPID PANEL
Cholesterol: 148 mg/dL (ref <200)
HDL: 78 mg/dL (ref >=50)
LDL Cholesterol (Calc): 53 mg/dL
Non-HDL Cholesterol (Calc): 70 mg/dL (ref <130)
Total CHOL/HDL Ratio: 1.9 (calc) (ref <5.0)
Triglycerides: 88 mg/dL (ref <150)

## 2023-08-01 MED ORDER — MELOXICAM 7.5 MG PO TABS
7.5000 mg | ORAL_TABLET | Freq: Every day | ORAL | 1 refills | Status: AC | PRN
  Filled 2023-08-01: qty 90, 90d supply, fill #0

## 2023-08-01 NOTE — Telephone Encounter (Signed)
 Request sent

## 2023-08-01 NOTE — Assessment & Plan Note (Signed)
 Maintained on cpap which is managed by pulmonology.

## 2023-08-01 NOTE — Assessment & Plan Note (Signed)
 Diffuse joint pain without swelling, positive ANA 1:40, no lupus symptoms. Family history of rheumatoid and psoriatic arthritis. Exercise and meloxicam provide relief. Possible tamoxifen  and perimenopause contribution. - Refer to rheumatology for evaluation. - Continue meloxicam as needed. - Consider alternating meloxicam with extra strength tylenol  on good days.

## 2023-08-01 NOTE — Telephone Encounter (Signed)
 Please request copy of pap from [facility] office.

## 2023-08-01 NOTE — Progress Notes (Signed)
 Subjective:     Patient ID: Amber [name], female    DOB: 12/20/1970, 53 y.o.   MRN: [id number]  Chief Complaint  Patient presents with   Annual Exam    HPI  Discussed the use of AI scribe software for clinical note transcription with the patient, who gave verbal consent to proceed.  History of Present Illness  Amber [name] is a 53 year old female who presents for an annual physical exam.  She experiences significant joint aches with diffuse stiffness in her hips, low back, elbows, and shoulders. Exercise alleviates the stiffness. She has a family history of rheumatoid and psoriatic arthritis. Recent lab work showed a positive ANA without a titer and a negative rheumatoid factor. She takes meloxicam for relief but is concerned about long-term NSAID use.  She has sleep disturbances and attributes some symptoms to tamoxifen , used for breast cancer treatment. She has a history of sleep apnea and uses a CPAP machine.  She experiences mood swings, anxiety, and difficulty concentrating, possibly related to perimenopause and potential ADHD. She manages symptoms with exercise and caffeine. Her focus has worsened over the past two to three years.  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 Her family history includes dementia and ALS. She is open to checking her vitamin D  and thyroid  levels.   Immunizations: due for shingrix Diet: healthy Exercise: regular Colonoscopy: 2022- good until 2023 Pap Smear: up to date per gyn Mammogram: up to date Vision: up to date Dental: up to date      Health Maintenance Due  Topic Date Due   Cervical Cancer Screening (HPV/Pap Cotest)  01/26/2022   COVID-19 Vaccine (4 - 2024-25 season) 10/15/2022    Past Medical History:  Diagnosis Date   Breast cancer (HCC) 2021   RIGHT   Deviated septum    Extensor tendinitis of foot 07/31/2013   First toe   Malignant neoplasm of upper-outer quadrant of right breast in  female, estrogen receptor positive (HCC) 11/24/2019   Personal history of radiation therapy 2022   Sleep apnea    mild sleep apnea- uses CPAP nightly    Past Surgical History:  Procedure Laterality Date   BREAST BIOPSY Left 2019   BREAST BIOPSY Right 11/20/2019   BREAST BIOPSY Left 12/08/2019   BREAST LUMPECTOMY Right 01/02/2020   BREAST LUMPECTOMY WITH RADIOACTIVE SEED AND SENTINEL LYMPH NODE BIOPSY Right 01/02/2020   Procedure: RIGHT BREAST LUMPECTOMY WITH RADIOACTIVE SEED AND RIGHT AXILLARY SENTINEL LYMPH NODE BIOPSY;  Surgeon: Enid Harry, MD;  Location: [facility];  Service: General;  Laterality: Right;  PEC BLOCK   DILATION AND EVACUATION  07/14/2001   miscarriage   ENDOMETRIAL ABLATION W/ NOVASURE  2010    Family History  Problem Relation Age of Onset   Colon polyps Mother 43   Arthritis Mother    GER disease Mother    Arthritis Sister        psoriatic arthritis, colits (collagenous)   Diabetes Mellitus II Sister    Dementia Maternal Grandmother    Lung cancer Maternal Grandfather 56   Breast cancer Paternal Aunt        dx after 50, triple negative   ALS Maternal Uncle    Colon cancer Neg Hx    Esophageal cancer Neg Hx    Rectal cancer Neg Hx    Stomach cancer Neg Hx     Social History   Socioeconomic History   Marital status:  Married    Spouse name: Not on file   Number of children: 3   Years of education: Not on file   Highest education level: Master's degree (e.g., MA, MS, MEng, MEd, MSW, MBA)  Occupational History   Occupation: Nurse P.  Tobacco Use   Smoking status: Never   Smokeless tobacco: Never  Vaping Use   Vaping status: Never Used  Substance and Sexual Activity   Alcohol use: Not Currently   Drug use: Never   Sexual activity: Yes    Partners: Male  Other Topics Concern   Not on file  Social History Narrative   Not on file   Social Drivers of Health   Financial Resource Strain: Low Risk  (08/01/2023)   Overall  Financial Resource Strain (CARDIA)    Difficulty of Paying Living Expenses: Not hard at all  Food Insecurity: No Food Insecurity (08/01/2023)   Hunger Vital Sign    Worried About Running Out of Food in the Last Year: Never true    Ran Out of Food in the Last Year: Never true  Transportation Needs: No Transportation Needs (08/01/2023)   PRAPARE - Administrator, Civil Service (Medical): No    Lack of Transportation (Non-Medical): No  Physical Activity: Sufficiently Active (08/01/2023)   Exercise Vital Sign    Days of Exercise per Week: 7 days    Minutes of Exercise per Session: 30 min  Stress: Stress Concern Present (08/01/2023)   [employer] of Occupational Health - Occupational Stress Questionnaire    Feeling of Stress: Rather much  Social Connections: Socially Integrated (08/01/2023)   Social Connection and Isolation Panel    Frequency of Communication with Friends and Family: Three times a week    Frequency of Social Gatherings with Friends and Family: Twice a week    Attends Religious Services: More than 4 times per year    Active Member of [employer] or Organizations: Yes    Attends Banker Meetings: 1 to 4 times per year    Marital Status: Married  Catering manager Violence: Not At Risk (07/03/2017)   Humiliation, Afraid, Rape, and Kick questionnaire    Fear of Current or Ex-Partner: No    Emotionally Abused: No    Physically Abused: No    Sexually Abused: No    Outpatient Medications Prior to Visit  Medication Sig Dispense Refill   tamoxifen  (NOLVADEX ) 20 MG tablet Take 1 tablet (20 mg) by mouth daily. 90 tablet 3   zolpidem  (AMBIEN ) 5 MG tablet Take 1 tablet (5 mg total) by mouth at bedtime as needed for sleep 90 tablet 1   azithromycin  (ZITHROMAX ) 250 MG tablet Take 2 tablets by mouth the first day and then 1 tablet daily for the remaining four days 6 tablet 0   ondansetron  (ZOFRAN ) 4 MG tablet Take 1 tablet (4 mg total) by mouth every 8 (eight) hours  as needed for nausea or vomiting. 30 tablet 0   No facility-administered medications prior to visit.    No Known Allergies  Review of Systems  Constitutional:  Negative for weight loss.  HENT:  Negative for congestion.   Eyes:  Positive for blurred vision (wears glasses for driving).  Respiratory:  Negative for cough.   Cardiovascular:  Negative for leg swelling.  Gastrointestinal:  Negative for blood in stool, constipation and diarrhea.  Genitourinary:  Negative for dysuria and frequency.  Musculoskeletal:  Positive for joint pain.  Skin:  Negative for rash.  Neurological:  Negative for headaches.  Psychiatric/Behavioral:         Some mood swings which she attributes to hormones.       Objective:    Physical Exam   BP (!) 102/32 (BP Location: Left Arm, Patient Position: Sitting, Cuff Size: Small)   Pulse 72   Temp 98.2 F (36.8 C) (Oral)   Resp 16   Ht 5' 8 (1.727 m)   Wt 137 lb (62.1 kg)   SpO2 99%   BMI 20.83 kg/m  Wt Readings from Last 3 Encounters:  08/01/23 137 lb (62.1 kg)  09/04/22 140 lb (63.5 kg)  08/24/21 143 lb 1.6 oz (64.9 kg)  Physical Exam  Constitutional: She is oriented to person, place, and time. She appears well-developed and well-nourished. No distress.  HENT:  Head: Normocephalic and atraumatic.  Right Ear: Tympanic membrane and ear canal normal.  Left Ear: Tympanic membrane and ear canal normal.  Mouth/Throat: Oropharynx is clear and moist.  Eyes: Pupils are equal, round, and reactive to light. No scleral icterus.  Neck: Normal range of motion. No thyromegaly present.  Cardiovascular: Normal rate and regular rhythm.   No murmur heard. Pulmonary/Chest: Effort normal and breath sounds normal. No respiratory distress. He has no wheezes. She has no rales. She exhibits no tenderness.  Abdominal: Soft. Bowel sounds are normal. She exhibits no distension and no mass. There is no tenderness. There is no rebound and no guarding.  Musculoskeletal:  She exhibits no edema.  Lymphadenopathy:    She has no cervical adenopathy.  Neurological: She is alert and oriented to person, place, and time. She has normal patellar reflexes. She exhibits normal muscle tone. Coordination normal.  Skin: Skin is warm and dry.  Psychiatric: She has a normal mood and affect. Her behavior is normal. Judgment and thought content normal.  Breast/Pelvic: deferred to GYN         Assessment & Plan:        Assessment & Plan:   Problem List Items Addressed This Visit       Unprioritized   Obstructive sleep apnea (Chronic)   Maintained on cpap which is managed by pulmonology.       Preventative health care - Primary    Routine health maintenance discussed. Shingles vaccine planned. Labs ordered for health monitoring. - Administer shingles vaccine (first dose today, second dose in 3-6 months). - Order lipid panel, A1c, vitamin D , and thyroid  function tests. - Maintain regular exercise. - Schedule nurse visit for second shingles vaccine. - Continue regular dental and vision check-ups. - Ensure regular mammogram and Pap smear screenings.      Relevant Orders   VITAMIN D  25 Hydroxy (Vit-D Deficiency, Fractures)   TSH   Lipid panel   Perimenopause    Symptoms include mood swings, anxiety, brain fog. No hot flashes. Tamoxifen  may contribute. Exercise helps anxiety. She is hesitant to take an SSRI but will think about it.  - Discuss non-hormonal management options. - Consider low-dose Lexapro  (5 mg). - Evaluate ADHD as a contributing factor to her anxiety.      Attention deficit   Refer to [facility] for formal testing.  She would be open to treatment.       Relevant Orders   Ambulatory referral to Psychology   ANA positive   Diffuse joint pain without swelling, positive ANA 1:40, no lupus symptoms. Family history of rheumatoid and psoriatic arthritis. Exercise and meloxicam provide relief. Possible tamoxifen  and perimenopause  contribution. - Refer to  rheumatology for evaluation. - Continue meloxicam as needed. - Consider alternating meloxicam with extra strength tylenol  on good days.      Relevant Orders   Ambulatory referral to Rheumatology   Other Visit Diagnoses       Need for shingles vaccine       Relevant Orders   Varicella-zoster vaccine IM (Completed)     Arthralgia, unspecified joint       Relevant Medications   meloxicam (MOBIC) 7.5 MG tablet       I have discontinued Teria S. Reichelt's ondansetron  and azithromycin . I am also having her start on meloxicam. Additionally, I am having her maintain her tamoxifen  and zolpidem .  Meds ordered this encounter  Medications   meloxicam (MOBIC) 7.5 MG tablet    Sig: Take 1 tablet (7.5 mg total) by mouth daily as needed for pain.    Dispense:  90 tablet    Refill:  1    Supervising Provider:   Randie Bustle A [[id number]]

## 2023-08-01 NOTE — Assessment & Plan Note (Signed)
  Routine health maintenance discussed. Shingles vaccine planned. Labs ordered for health monitoring. - Administer shingles vaccine (first dose today, second dose in 3-6 months). - Order lipid panel, A1c, vitamin D , and thyroid  function tests. - Maintain regular exercise. - Schedule nurse visit for second shingles vaccine. - Continue regular dental and vision check-ups. - Ensure regular mammogram and Pap smear screenings.

## 2023-08-01 NOTE — Assessment & Plan Note (Signed)
  Symptoms include mood swings, anxiety, brain fog. No hot flashes. Tamoxifen  may contribute. Exercise helps anxiety. She is hesitant to take an SSRI but will think about it.  - Discuss non-hormonal management options. - Consider low-dose Lexapro  (5 mg). - Evaluate ADHD as a contributing factor to her anxiety.

## 2023-08-01 NOTE — Assessment & Plan Note (Addendum)
 Refer to [facility] for formal testing.  She would be open to treatment.

## 2023-08-01 NOTE — Patient Instructions (Signed)
 VISIT SUMMARY:  Today, you had your annual physical exam where we discussed your joint pain, perimenopausal symptoms, potential ADHD, and ongoing breast cancer treatment. We also reviewed your general health maintenance and planned necessary vaccinations and lab tests.  YOUR PLAN:  JOINT PAIN: You have diffuse joint pain with a weakly positive ANA and a family history of psoriatic arthritis. Exercise and meloxicam help relieve the pain. -Continue taking meloxicam as needed. -Consider alternating meloxicam with tylenol  on good days. -You will be referred to a rheumatologist for further evaluation.  PERIMENOPAUSE: You are experiencing mood swings, anxiety, and brain fog, which may be related to perimenopause and tamoxifen  use. -Consider starting low-dose SSRI for mood swings and anxiety. -Evaluate ADHD as a contributing factor.  ATTENTION DEFICIT HYPERACTIVITY DISORDER (ADHD): You have suspected ADHD with worsening focus and task completion. -You will be referred to [facility] in [facility] for testing. -Consider treatment if ADHD is confirmed.  BREAST CANCER: You are on tamoxifen  for breast cancer treatment and have regular oncology follow-ups. -Continue taking tamoxifen  as prescribed. -Follow up with oncology next month.  GENERAL HEALTH MAINTENANCE: Routine health maintenance was discussed, including vaccinations and lab tests. -Administer the first dose of the shingles vaccine today and schedule the second dose in 2-6 months. -Order a lipid panel, A1c, vitamin D , and thyroid  function tests. -Maintain regular exercise. -Schedule a nurse visit for the second shingles vaccine. -Continue regular dental and vision check-ups. -Ensure regular mammogram and Pap smear screenings.

## 2023-08-02 ENCOUNTER — Ambulatory Visit: Payer: Self-pay | Admitting: Family

## 2023-08-02 LAB — TSH: TSH: 0.72 u[IU]/mL (ref 0.35–5.50)

## 2023-08-02 LAB — VITAMIN D 25 HYDROXY (VIT D DEFICIENCY, FRACTURES): VITD: 55.83 ng/mL (ref 30.00–100.00)

## 2023-08-10 ENCOUNTER — Other Ambulatory Visit (HOSPITAL_COMMUNITY): Payer: Self-pay

## 2023-08-11 DIAGNOSIS — G4733 Obstructive sleep apnea (adult) (pediatric): Secondary | ICD-10-CM | POA: Diagnosis not present

## 2023-09-03 ENCOUNTER — Telehealth: Payer: Self-pay

## 2023-09-03 NOTE — Telephone Encounter (Signed)
 Patient confirmed

## 2023-09-04 ENCOUNTER — Inpatient Hospital Stay: Payer: Commercial Managed Care - PPO | Attending: Hematology and Oncology | Admitting: Hematology and Oncology

## 2023-09-04 ENCOUNTER — Inpatient Hospital Stay

## 2023-09-04 VITALS — BP 108/57 | HR 87 | Temp 97.9°F | Resp 17 | Wt 137.5 lb

## 2023-09-04 DIAGNOSIS — G4730 Sleep apnea, unspecified: Secondary | ICD-10-CM | POA: Diagnosis not present

## 2023-09-04 DIAGNOSIS — Z79810 Long term (current) use of selective estrogen receptor modulators (SERMs): Secondary | ICD-10-CM | POA: Insufficient documentation

## 2023-09-04 DIAGNOSIS — M25552 Pain in left hip: Secondary | ICD-10-CM | POA: Insufficient documentation

## 2023-09-04 DIAGNOSIS — M25551 Pain in right hip: Secondary | ICD-10-CM | POA: Diagnosis not present

## 2023-09-04 DIAGNOSIS — R5383 Other fatigue: Secondary | ICD-10-CM | POA: Insufficient documentation

## 2023-09-04 DIAGNOSIS — C50411 Malignant neoplasm of upper-outer quadrant of right female breast: Secondary | ICD-10-CM

## 2023-09-04 DIAGNOSIS — Z923 Personal history of irradiation: Secondary | ICD-10-CM | POA: Insufficient documentation

## 2023-09-04 DIAGNOSIS — N6489 Other specified disorders of breast: Secondary | ICD-10-CM | POA: Insufficient documentation

## 2023-09-04 DIAGNOSIS — M25519 Pain in unspecified shoulder: Secondary | ICD-10-CM | POA: Diagnosis not present

## 2023-09-04 DIAGNOSIS — Z803 Family history of malignant neoplasm of breast: Secondary | ICD-10-CM | POA: Diagnosis not present

## 2023-09-04 DIAGNOSIS — Z801 Family history of malignant neoplasm of trachea, bronchus and lung: Secondary | ICD-10-CM | POA: Diagnosis not present

## 2023-09-04 DIAGNOSIS — Z170 Estrogen receptor positive status [ER+]: Secondary | ICD-10-CM | POA: Diagnosis not present

## 2023-09-04 LAB — CMP (CANCER CENTER ONLY)
ALT: 15 U/L (ref 0–44)
AST: 18 U/L (ref 15–41)
Albumin: 4.2 g/dL (ref 3.5–5.0)
Alkaline Phosphatase: 49 U/L (ref 38–126)
Anion gap: 4 — ABNORMAL LOW (ref 5–15)
BUN: 19 mg/dL (ref 6–20)
CO2: 30 mmol/L (ref 22–32)
Calcium: 9.2 mg/dL (ref 8.9–10.3)
Chloride: 106 mmol/L (ref 98–111)
Creatinine: 0.78 mg/dL (ref 0.44–1.00)
GFR, Estimated: >60 mL/min (ref >60)
Glucose, Bld: 108 mg/dL — ABNORMAL HIGH (ref 70–99)
Potassium: 3.8 mmol/L (ref 3.5–5.1)
Sodium: 140 mmol/L (ref 135–145)
Total Bilirubin: 0.3 mg/dL (ref 0.0–1.2)
Total Protein: 6.9 g/dL (ref 6.5–8.1)

## 2023-09-04 NOTE — Progress Notes (Signed)
 [facility]  [phone] Fax:([phone]     ID: Amber [name] DOB: 02-Mar-1970  MR#: [id number]  RDW#:[id number]  Patient Care Team: Daryl Setter, NP as PCP - General (Internal Medicine) Shannon Agent, MD as Consulting Physician (Radiation Oncology) Ebbie Cough, MD as Consulting Physician (General Surgery) Loretha Ash, MD as Consulting Physician (Hematology and Oncology) Crawford, Morna Pickle, NP as Nurse Practitioner (Hematology and Oncology) Ash Loretha, MD  CHIEF COMPLAINT: Estrogen receptor positive breast cancer  CURRENT TREATMENT: tamoxifen    INTERVAL HISTORY:  Amber [name] returns today for follow up of her estrogen receptor positive breast cancer.  She began tamoxifen  on 04/16/2020.   Discussed the use of AI scribe software for clinical note transcription with the patient, who gave verbal consent to proceed.  History of Present Illness Amber [name] is a 53 year old female with ER positive breast cancer who presents for follow-up regarding her treatment and joint pain.  She is currently undergoing treatment with tamoxifen  for ER positive breast cancer and is halfway through her planned five-year treatment. She expresses dissatisfaction with tamoxifen , describing it as 'same old, same old' and 'I hate that stuff.'  She experiences significant joint pain affecting all her big and small joints, particularly her shoulders, hips, back, and knees, describing it as feeling like 'someone's taking a sledgehammer to her.' Exercise helps alleviate the pain. She plans to see a rheumatologist due to a strong family history of rheumatological conditions, including her sister's psoriatic arthritis and colitis. She mentions experiencing bursitis and avoids NSAIDs, using meloxicam  occasionally when the pain is severe.  She experiences fatigue and attributes some of her symptoms to perimenopause, as she is 53 years old and has had an ablation,  making it difficult to track her menstrual cycle. She manages her symptoms with exercise and is considering dietary improvements.  She has a history of sleep apnea, which she describes as not related to tonsils or nasal issues, and she is unsure of the cause. She mentions that her airway looks good despite the condition.  Her current medication includes tamoxifen , and she occasionally takes meloxicam  for joint pain. She has started taking a multivitamin and is considering other natural supplements but is cautious about using hormones due to her breast cancer history.    COVID 19 VACCINATION STATUS: fully vaccinated [employer]), with booster 10/2019   HISTORY OF CURRENT ILLNESS: From the original intake note:  Amber [name] has a history of bilateral breast biopsies. 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 In 08/2017, a left breast biopsy showed only fibrocystic change.  She had routine screening mammography on 10/28/2019 showing a possible abnormality in the right breast. 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  Accordingly on 11/20/2019 she proceeded to biopsy of the right breast area in question. The pathology from this procedure ([id number]) showed: invasive ductal carcinoma, grade 1; ductal carcinoma in situ. The prognostic panel results were reported at conference this morning as showing estrogen receptor 95% positive with strong staining, progesterone receptor 50% positive with strong staining, HER-2 not amplified by immunohistochemistry, and MIB-1 of 2%  The patient's subsequent history is as detailed below.   PAST MEDICAL HISTORY: Past Medical History:   Diagnosis Date   Breast  cancer (HCC) 2021   RIGHT   Deviated septum    Extensor tendinitis of foot 07/31/2013   First toe   Malignant neoplasm of upper-outer quadrant of right breast in female, estrogen receptor positive (HCC) 11/24/2019   Personal history of radiation therapy 2022   Sleep apnea    mild sleep apnea- uses CPAP nightly   Sleep apnea; on CPAP   PAST SURGICAL HISTORY: Past Surgical History:  Procedure Laterality Date   BREAST BIOPSY Left 2019   BREAST BIOPSY Right 11/20/2019   BREAST BIOPSY Left 12/08/2019   BREAST LUMPECTOMY Right 01/02/2020   BREAST LUMPECTOMY WITH RADIOACTIVE SEED AND SENTINEL LYMPH NODE BIOPSY Right 01/02/2020   Procedure: RIGHT BREAST LUMPECTOMY WITH RADIOACTIVE SEED AND RIGHT AXILLARY SENTINEL LYMPH NODE BIOPSY;  Surgeon: Ebbie Cough, MD;  Location:  SURGERY CENTER;  Service: General;  Laterality: Right;  PEC BLOCK   DILATION AND EVACUATION  07/14/2001   miscarriage   ENDOMETRIAL ABLATION W/ NOVASURE  2010    FAMILY HISTORY: Family History  Problem Relation Age of Onset   Colon polyps Mother 14   Arthritis Mother    GER disease Mother    Arthritis Sister        psoriatic arthritis, colits (collagenous)   Diabetes Mellitus II Sister    Dementia Maternal Grandmother    Lung cancer Maternal Grandfather 58   Breast cancer Paternal Aunt        dx after 50, triple negative   ALS Maternal Uncle    Colon cancer Neg Hx    Esophageal cancer Neg Hx    Rectal cancer Neg Hx    Stomach cancer Neg Hx   The patient's father died in an accident in his 27s.  The patient's mother is 35 years old as of October 2021.  The patient has 1 sister, no brothers.  On the paternal side there is an aunt with possible cancer but the patient has very little data.  On the maternal side the patient's mother's father died from lung cancer in the setting of tobacco abuse   GYNECOLOGIC HISTORY:  No LMP recorded. Patient has had an  ablation. Menarche: 53 years old Age at first live birth: 53 years old GX P 3 LMP status post endometrial ablation Contraceptive husband is status post vasectomy HRT no  Hysterectomy?  No BSO?  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  Her husband Krystal works in [employer] for [employer].  Their children are 17, 15 and 14.  Her 45 year old daughter is already committed to [facility] and has a Chiropractor.  The patient attends [employer] in [facility]    ADVANCED DIRECTIVES: In the absence of any documentation to the contrary, the patient's spouse is their HCPOA.    HEALTH MAINTENANCE: Social History   Tobacco Use   Smoking status: Never   Smokeless tobacco: Never  Vaping Use   Vaping status: Never Used  Substance Use Topics   Alcohol use: Not Currently   Drug use: Never     Colonoscopy: n/a (age)  PAP: Up-to-date  Bone density: Never   No Known Allergies  Current Outpatient Medications  Medication Sig Dispense Refill   meloxicam  (MOBIC ) 7.5 MG tablet Take 1 tablet (7.5 mg total) by mouth daily as needed for pain. 90 tablet 1   tamoxifen  (NOLVADEX ) 20 MG tablet Take 1 tablet (20 mg) by mouth daily. 90 tablet 3   zolpidem  (AMBIEN ) 5  MG tablet Take 1 tablet (5 mg total) by mouth at bedtime as needed for sleep 90 tablet 1   No current facility-administered medications for this visit.    OBJECTIVE: White woman who appears younger than stated age  Vitals:   09/04/23 1306  BP: (!) 108/57  Pulse: 87  Resp: 17  Temp: 97.9 F (36.6 C)  SpO2: 99%      Body mass index is 20.91 kg/m.   Wt Readings from Last 3 Encounters:  09/04/23 137 lb 8 oz (62.4 kg)  08/01/23 137 lb (62.1 kg)  09/04/22 140 lb (63.5 kg)      ECOG FS:1 - Symptomatic but completely ambulatory  Physical Exam Constitutional:      Appearance: Normal appearance.  Cardiovascular:     Rate and Rhythm: Normal rate and regular rhythm.   Chest:     Comments: Bilateral breast examined.  Right breast lumpectomy scar noted at the edge of the breast.  No palpable masses or regional adenopathy Musculoskeletal:        General: No swelling.     Cervical back: Normal range of motion and neck supple. No rigidity.  Lymphadenopathy:     Cervical: No cervical adenopathy.  Neurological:     Mental Status: She is alert.       LAB RESULTS:  CMP     Component Value Date/Time   NA 140 08/24/2021 1321   K 4.0 08/24/2021 1321   CL 108 08/24/2021 1321   CO2 26 08/24/2021 1321   GLUCOSE 115 (H) 08/24/2021 1321   BUN 16 08/24/2021 1321   CREATININE 0.76 08/24/2021 1321   CREATININE 0.84 11/14/2018 0944   CALCIUM 8.9 08/24/2021 1321   PROT 6.7 08/24/2021 1321   ALBUMIN 4.1 08/24/2021 1321   AST 15 08/24/2021 1321   ALT 13 08/24/2021 1321   ALKPHOS 38 08/24/2021 1321   BILITOT 0.2 (L) 08/24/2021 1321   GFRNONAA >60 08/24/2021 1321   GFRNONAA 83 11/14/2018 0944   GFRAA 96 11/14/2018 0944    No results found for: TOTALPROTELP, ALBUMINELP, A1GS, A2GS, BETS, BETA2SER, GAMS, MSPIKE, SPEI  Lab Results  Component Value Date   WBC 4.7 06/27/2023   NEUTROABS 2.9 06/27/2023   HGB 13.3 06/27/2023   HCT 39.9 06/27/2023   MCV 89.1 06/27/2023   PLT 226.0 06/27/2023    No results found for: LABCA2  No components found for: [id number]  No results for input(s): INR in the last 168 hours.  No results found for: LABCA2  No results found for: RJW800  No results found for: CAN125  No results found for: CAN153  No results found for: CA2729  No components found for: HGQUANT  No results found for: CEA1, CEA / No results found for: CEA1, CEA   No results found for: AFPTUMOR  No results found for: CHROMOGRNA  No results found for: KPAFRELGTCHN, LAMBDASER, KAPLAMBRATIO (kappa/lambda light chains)  No results found for: HGBA, HGBA2QUANT, HGBFQUANT,  HGBSQUAN (Hemoglobinopathy evaluation)   No results found for: LDH  Lab Results  Component Value Date   IRON 93 01/26/2020   (Iron and TIBC)  Lab Results  Component Value Date   FERRITIN 94.8 01/26/2020    Urinalysis    Component Value Date/Time   COLORURINE YELLOW 06/17/2018 1540   APPEARANCEUR CLEAR 06/17/2018 1540   LABSPEC <=1.005 (A) 06/17/2018 1540   PHURINE 6.5 06/17/2018 1540   GLUCOSEU NEGATIVE 06/17/2018 1540   HGBUR NEGATIVE 06/17/2018 1540   BILIRUBINUR NEGATIVE 06/17/2018 1540  KETONESUR NEGATIVE 06/17/2018 1540   UROBILINOGEN 0.2 06/17/2018 1540   NITRITE NEGATIVE 06/17/2018 1540   LEUKOCYTESUR NEGATIVE 06/17/2018 1540    STUDIES: No results found.   ELIGIBLE FOR AVAILABLE RESEARCH PROTOCOL: AET  ASSESSMENT: 53 y.o. [facility] woman status post right breast upper outer quadrant biopsy 11/20/2019 for a clinical T1c N0, stage IA invasive ductal carcinoma, grade 1, estrogen and progesterone receptor positive, HER-2 not amplified, with an MIB-1 of 2%  (1) genetics testing 12/04/2019 through the [employer] Common Hereditary Cancers Panel found no deleterious mutations in APC, ATM, AXIN2, BARD1, BMPR1A, BRCA1, BRCA2, BRIP1, CDH1, CDK4, CDKN2A (p14ARF), CDKN2A (p16INK4a), CHEK2, CTNNA1, DICER1, EPCAM (Deletion/duplication testing only), GREM1 (promoter region deletion/duplication testing only), KIT, MEN1, MLH1, MSH2, MSH3, MSH6, MUTYH, NBN, NF1, NHTL1, PALB2, PDGFRA, PMS2, POLD1, POLE, PTEN, RAD50, RAD51C, RAD51D, RNF43, SDHB, SDHC, SDHD, SMAD4, SMARCA4. STK11, TP53, TSC1, TSC2, and VHL.  The following genes were evaluated for sequence changes only: SDHA and HOXB13 c.251G>A variant only.  (a) Variant of uncertain significance in ATM at c.5081C>G (p.Ala1694Gly).   (2) status post right lumpectomy and sentinel lymph node sampling 01/02/2020 for a pT1b pN0, stage IA invasive ductal carcinoma, grade 1, with negative margins.  (3) Oncotype score of 16 predicts a risk  of recurrence outside the breast in the next 9 years of 4% if the patient's only systemic therapy is antiestrogens for 5 years.  It also predicts no benefit from chemotherapy.  (4) adjuvant radiation  02/09/2020 through 03/09/2020   Site: Right breast Technique: 3D Total Dose (Gy): 40.05/40.05 Dose per Fx (Gy): 2.67 Completed Fx: 15/15 Beam Energies: 6X   Site: Right breast boost Technique: specialPort Total Dose (Gy): 10/10 Dose per Fx (Gy): 2 Completed Fx: 5/5 Beam Energies: 6E, 9E  (5) started tamoxifen  04/13/2020  (a) bone density 11/30/2020  (b) FSH and estradiol  level 08/17/2020   PLAN:  Assessment and Plan Assessment & Plan ER-positive breast cancer Early stage, no lymph node involvement. Currently on tamoxifen . Consideration of extending therapy beyond five years due to potential late recurrences. Discussed small benefit between seven and ten years. Breast cancer index test may guide decision on extended therapy. - Continue tamoxifen  therapy. - Consider breast cancer index test closer to the five-year mark to assess benefit of extended endocrine therapy. - Perform annual liver function tests due to tamoxifen  use.  Joint pain due to tamoxifen  Joint pain likely related to tamoxifen , affecting large joints. Exercise provides relief. No joint swelling. Family history of psoriatic arthritis and colitis. Rheumatology referral planned. Meloxicam  used for severe pain. Consider dose reduction if pain intolerable. - Continue exercise regimen for joint pain management.  Sleep apnea Etiology unclear, airway appears normal.  General Health Maintenance Mammogram up to date as of December. Breasts moderately dense. - Perform comprehensive metabolic panel (CMP) today. - Schedule follow-up appointment in one year if all is well.  Total encounter time 30 minutes.*  *Total Encounter Time as defined by the Centers for Medicare and Medicaid Services includes, in addition to the  face-to-face time of a patient visit (documented in the note above) non-face-to-face time: obtaining and reviewing outside history, ordering and reviewing medications, tests or procedures, care coordination (communications with other health care professionals or caregivers) and documentation in the medical record.

## 2023-09-10 DIAGNOSIS — G4733 Obstructive sleep apnea (adult) (pediatric): Secondary | ICD-10-CM | POA: Diagnosis not present

## 2023-09-28 ENCOUNTER — Other Ambulatory Visit: Payer: Self-pay | Admitting: *Deleted

## 2023-09-28 ENCOUNTER — Other Ambulatory Visit (HOSPITAL_COMMUNITY): Payer: Self-pay

## 2023-09-28 ENCOUNTER — Other Ambulatory Visit: Payer: Self-pay | Admitting: Hematology and Oncology

## 2023-09-28 ENCOUNTER — Encounter: Payer: Self-pay | Admitting: Hematology and Oncology

## 2023-09-28 MED ORDER — TAMOXIFEN CITRATE 20 MG PO TABS
20.0000 mg | ORAL_TABLET | Freq: Every day | ORAL | 3 refills | Status: AC
  Filled 2023-09-28: qty 60, 60d supply, fill #0
  Filled 2023-09-28: qty 30, 30d supply, fill #0
  Filled 2024-01-07: qty 90, 90d supply, fill #1

## 2023-10-11 DIAGNOSIS — G4733 Obstructive sleep apnea (adult) (pediatric): Secondary | ICD-10-CM | POA: Diagnosis not present

## 2023-10-31 ENCOUNTER — Ambulatory Visit

## 2023-11-07 ENCOUNTER — Ambulatory Visit (INDEPENDENT_AMBULATORY_CARE_PROVIDER_SITE_OTHER): Admitting: *Deleted

## 2023-11-07 DIAGNOSIS — Z23 Encounter for immunization: Secondary | ICD-10-CM | POA: Diagnosis not present

## 2023-11-07 NOTE — Progress Notes (Signed)
 Pt in for 2nd Shingrix  per Eleanor Ponto.  Given LD without complications.

## 2023-11-11 DIAGNOSIS — G4733 Obstructive sleep apnea (adult) (pediatric): Secondary | ICD-10-CM | POA: Diagnosis not present

## 2023-11-22 ENCOUNTER — Other Ambulatory Visit: Payer: Self-pay

## 2023-11-26 ENCOUNTER — Other Ambulatory Visit: Payer: Self-pay | Admitting: Hematology and Oncology

## 2023-11-26 DIAGNOSIS — Z1231 Encounter for screening mammogram for malignant neoplasm of breast: Secondary | ICD-10-CM

## 2023-12-17 ENCOUNTER — Other Ambulatory Visit (HOSPITAL_COMMUNITY): Payer: Self-pay

## 2023-12-17 ENCOUNTER — Telehealth: Payer: Self-pay | Admitting: Nurse Practitioner

## 2023-12-17 DIAGNOSIS — H9313 Tinnitus, bilateral: Secondary | ICD-10-CM

## 2023-12-17 MED ORDER — PREDNISONE 10 MG PO TABS
ORAL_TABLET | ORAL | 0 refills | Status: AC
  Filled 2023-12-17: qty 20, 8d supply, fill #0

## 2023-12-17 NOTE — Telephone Encounter (Signed)
 12/17/2023: Pt with ongoing acute tinnitus symptoms for the past 12+ hours following loud noise exposure. Mild hearing impairment. No pain, drainage, headache, vision changes, dizziness/syncope, N/V. No head trauma. No other associated acute symptoms. Will start on prednisone  taper. Advised to seek emergent care/ENT care should symptoms worsen or fail to improve.

## 2024-01-08 ENCOUNTER — Other Ambulatory Visit (HOSPITAL_COMMUNITY): Payer: Self-pay

## 2024-01-11 DIAGNOSIS — G4733 Obstructive sleep apnea (adult) (pediatric): Secondary | ICD-10-CM | POA: Diagnosis not present

## 2024-01-17 NOTE — Progress Notes (Unsigned)
 Office Visit Note  Patient: Amber [name]             Date of Birth: 1970/08/01           MRN: [id number]             PCP: Daryl Setter, NP Referring: Daryl Setter, NP Visit Date: 01/31/2024 Occupation: Data Unavailable  Subjective:  Pain in multiple joints  History of Present Illness: Amber [name] is a 52 y.o. female seen for the evaluation of positive ANA and arthralgias.  According to the patient her symptoms started with joint stiffness many years ago.  She is also experiencing insomnia and fatigue for many years.  She states in 2021 she was diagnosed with breast cancer which was treated with lumpectomy and radiation therapy followed by tamoxifen .  She states once she started taking tamoxifen  she noticed increased joint pain.  She recalls discussing with Dr. Loretha who thought that the symptoms were perimenopausal.  She also recalls having a right shoulder injury for which she had x-rays and MRI which showed some degenerative changes in her cervical spine from C4-C6.  She states she is very stiff in the morning she has pain in her entire spine and her SI joints.  She also notices discomfort in her both shoulders which even causes nocturnal pain at times.  She describes discomfort in her bilateral  medial epicondyle region and a stiffness in her hands.  She complains of discomfort in bilateral trochanteric bursa.  None of the other joints are painful.  She has not noticed any joint swelling.  Her hands feel puffy at times.  She recently had labs which showed positive ANA for that reason she was referred to me. She is a publishing rights manager at pulmonary.  She enjoys paddle boarding she also exercises on a regular basis which includes walking and lifting light weights.  She is right-handed, married, gravida 4 para 3 miscarriage 1.  There is no history of preeclampsia or DVTs.  She drinks alcohol only occasionally and never been a smoker.  Her sister has psoriatic arthritis and  lymphocytic colitis.  She has several first cousins on maternal side who have colitis, Crohn's disease and rheumatoid arthritis.    Activities of Daily Living:  Patient reports morning stiffness for 1 hour.   Patient Reports nocturnal pain.  Difficulty dressing/grooming: Denies Difficulty climbing stairs: Denies Difficulty getting out of chair: Denies Difficulty using hands for taps, buttons, cutlery, and/or writing: Denies  Review of Systems  Constitutional:  Positive for fatigue.  HENT:  Negative for mouth sores and mouth dryness.   Eyes:  Positive for dryness.  Respiratory:  Negative for shortness of breath.   Cardiovascular:  Negative for chest pain and palpitations.  Gastrointestinal:  Negative for blood in stool, constipation and diarrhea.  Endocrine: Negative for increased urination.  Genitourinary:  Negative for involuntary urination.  Musculoskeletal:  Positive for joint pain, joint pain, joint swelling, myalgias, morning stiffness, muscle tenderness and myalgias. Negative for gait problem and muscle weakness.  Skin:  Negative for color change, rash, hair loss and sensitivity to sunlight.  Allergic/Immunologic: Negative for susceptible to infections.  Neurological:  Negative for dizziness and headaches.  Hematological:  Negative for swollen glands.  Psychiatric/Behavioral:  Positive for sleep disturbance. Negative for depressed mood. The patient is not nervous/anxious.     PMFS History:  Patient Active Problem List   Diagnosis Date Noted   ANA positive 08/01/2023   Attention deficit 08/01/2023   Perimenopause  08/01/2023   Acute reaction to situational stress 01/26/2021   Preventative health care 01/26/2021   Insomnia 02/02/2020   Genetic testing 12/08/2019   Malignant neoplasm of upper-outer quadrant of right breast in female, estrogen receptor positive (HCC) 11/24/2019   Deviated septum 11/27/2017   Obstructive sleep apnea 11/13/2017    Past Medical History:   Diagnosis Date   Breast cancer (HCC) 2021   RIGHT   Deviated septum    Extensor tendinitis of foot 07/31/2013   First toe   Malignant neoplasm of upper-outer quadrant of right breast in female, estrogen receptor positive (HCC) 11/24/2019   Personal history of radiation therapy 2022   Sleep apnea    mild sleep apnea- uses CPAP nightly    Family History  Problem Relation Age of Onset   Colon polyps Mother 54   Arthritis Mother    GER disease Mother    Arthritis Sister        psoriatic arthritis, colits (collagenous)   Diabetes Mellitus II Sister    Rheum arthritis Maternal Aunt    ALS Maternal Uncle    Breast cancer Paternal Aunt        dx after 50, triple negative   Dementia Maternal Grandmother    Lung cancer Maternal Grandfather 92   Healthy Daughter    Healthy Daughter    Healthy Son    Crohn's disease Cousin    Ulcerative colitis Cousin    Colon cancer Neg Hx    Esophageal cancer Neg Hx    Rectal cancer Neg Hx    Stomach cancer Neg Hx    Past Surgical History:  Procedure Laterality Date   BREAST BIOPSY Left 2019   BREAST BIOPSY Right 11/20/2019   BREAST BIOPSY Left 12/08/2019   BREAST LUMPECTOMY Right 01/02/2020   BREAST LUMPECTOMY WITH RADIOACTIVE SEED AND SENTINEL LYMPH NODE BIOPSY Right 01/02/2020   Procedure: RIGHT BREAST LUMPECTOMY WITH RADIOACTIVE SEED AND RIGHT AXILLARY SENTINEL LYMPH NODE BIOPSY;  Surgeon: Ebbie Cough, MD;  Location: [facility];  Service: General;  Laterality: Right;  PEC BLOCK   DILATION AND EVACUATION  07/14/2001   miscarriage   ENDOMETRIAL ABLATION W/ NOVASURE  2010   Social History   Tobacco Use   Smoking status: Never    Passive exposure: Past   Smokeless tobacco: Never  Vaping Use   Vaping status: Never Used  Substance Use Topics   Alcohol use: Not Currently   Drug use: Never   Social History   Social History Narrative   Not on file     Immunization History  Administered Date(s) Administered    Influenza,inj,Quad PF,6+ Mos 11/28/2019   Influenza-Unspecified 11/27/2020   PFIZER(Purple Top)SARS-COV-2 Vaccination 02/10/2019, 03/03/2019, 11/07/2019   Pfizer Covid-19 Vaccine Bivalent Booster 81yrs & up 01/30/2021   Tdap 11/09/2015   Zoster Recombinant(Shingrix ) 08/01/2023, 11/07/2023     Objective: Vital Signs: BP 97/60 (BP Location: Left Arm, Patient Position: Sitting, Cuff Size: Normal)   Pulse 90   Temp 98.3 F (36.8 C)   Resp 16   Ht 5' 7.5 (1.715 m)   Wt 130 lb 12.8 oz (59.3 kg)   BMI 20.18 kg/m    Physical Exam Vitals and nursing note reviewed.  Constitutional:      Appearance: She is well-developed.  HENT:     Head: Normocephalic and atraumatic.  Eyes:     Conjunctiva/sclera: Conjunctivae normal.  Cardiovascular:     Rate and Rhythm: Normal rate and regular rhythm.  Heart sounds: Normal heart sounds.  Pulmonary:     Effort: Pulmonary effort is normal.     Breath sounds: Normal breath sounds.  Abdominal:     General: Bowel sounds are normal.     Palpations: Abdomen is soft.  Musculoskeletal:     Cervical back: Normal range of motion.  Lymphadenopathy:     Cervical: No cervical adenopathy.  Skin:    General: Skin is warm and dry.     Capillary Refill: Capillary refill takes less than 2 seconds.  Neurological:     Mental Status: She is alert and oriented to person, place, and time.  Psychiatric:        Behavior: Behavior normal.      Musculoskeletal Exam: Good range of motion of the cervical, thoracic and lumbar spine.  She good range of motion of bilateral shoulder joints with some discomfort.  Elbow joints with good range of motion with tenderness over bilateral medial epicondyle region.  Elbow joints, wrist joints, MCPs PIPs and DIPs were in good range of motion.  Bilateral PIP and DIP thickening was noted.  Hip joints were in good range of motion.  She had tenderness over bilateral trochanteric bursa.  Knee joints were in good range of motion  without any warmth swelling or effusion.  There was no tenderness over ankles or MTPs.  Bilateral bunions more prominent on the right foot was noted.  No plantar fasciitis or Achilles tendinitis was noted.  CDAI Exam: CDAI Score: -- Patient Global: --; Provider Global: -- Swollen: --; Tender: -- Joint Exam 01/31/2024   No joint exam has been documented for this visit   There is currently no information documented on the homunculus. Go to the Rheumatology activity and complete the homunculus joint exam.  Investigation: No additional findings.  Imaging: MM 3D SCREENING MAMMOGRAM BILATERAL BREAST Result Date: 01/27/2024 CLINICAL DATA:  Screening. EXAM: DIGITAL SCREENING BILATERAL MAMMOGRAM WITH TOMOSYNTHESIS AND CAD TECHNIQUE: Bilateral screening digital craniocaudal and mediolateral oblique mammograms were obtained. Bilateral screening digital breast tomosynthesis was performed. The images were evaluated with computer-aided detection. COMPARISON:  Previous exam(s). ACR Breast Density Category c: The breasts are heterogeneously dense, which may obscure small masses. FINDINGS: There are no findings suspicious for malignancy. IMPRESSION: No mammographic evidence of malignancy. A result letter of this screening mammogram will be mailed directly to the patient. RECOMMENDATION: Screening mammogram in one year. (Code:SM-B-01Y) BI-RADS CATEGORY  1: Negative. Electronically Signed   By: Curtistine Noble   On: 01/27/2024 18:10    Recent Labs: Lab Results  Component Value Date   WBC 4.7 06/27/2023   HGB 13.3 06/27/2023   PLT 226.0 06/27/2023   NA 140 09/04/2023   K 3.8 09/04/2023   CL 106 09/04/2023   CO2 30 09/04/2023   GLUCOSE 108 (H) 09/04/2023   BUN 19 09/04/2023   CREATININE 0.78 09/04/2023   BILITOT 0.3 09/04/2023   ALKPHOS 49 09/04/2023   AST 18 09/04/2023   ALT 15 09/04/2023   PROT 6.9 09/04/2023   ALBUMIN 4.2 09/04/2023   CALCIUM 9.2 09/04/2023   GFRAA 96 11/14/2018   09/27/2023  ANA 1: 40 NS, RF negative, sed rate 4, CRP<1.0  Speciality Comments: No specialty comments available.  Procedures:  No procedures performed Allergies: Patient has no known allergies.   Assessment / Plan:     Visit Diagnoses: Positive ANA (antinuclear antibody) -patient has low titer positive ANA.  She denies any history of oral ulcers, nasal ulcers, malar rash, Raynaud's, photosensitivity, inflammatory  arthritis.  She gives history of dry eyes.  She complains of fatigue and generalized achiness.  I will obtain additional labs today.  Plan: Protein / creatinine ratio, urine, Anti-scleroderma antibody, RNP Antibody, Anti-Smith antibody, Anti-DNA antibody, double-stranded, Sjogrens syndrome-B extractable nuclear antibody, Sjogrens syndrome-A extractable nuclear antibody, C3 and C4, Beta-2 glycoprotein antibodies, Cardiolipin antibodies, IgG, IgM, IgA  Medial epicondylitis of both elbows-she has tenderness over bilateral medial epicondyle region.  Most likely related to lifting weights.  I gave her handout on medial epicondyle exercises.  I advised her to contact me if her symptoms persist.  Primary osteoarthritis of both hands-she gives history of a stiffness in her bilateral hands.  Bilateral PIP and DIP thickening was noted.  Joint protection muscle strengthening was discussed.  Trochanteric bursitis of both hips-she tenderness over bilateral trochanteric bursa.  IT band stretches were demonstrated and a handout was given.  Valgus deformity of both great toes-bilateral bunions were noted.  Proper fitting shoes were advised.  DDD cervical spine-x-rays from November 28, 2022 were reviewed which showed multilevel spondylosis and facet joint arthropathy.  She continues to have some stiffness in her cervical spine.  Range of motion exercises were discussed.  Other insomnia-gives history of chronic insomnia and fatigue.  Possibility of myofascial pain was discussed.  Need for regular exercise and  stretching was emphasized.  Malignant neoplasm of upper-outer quadrant of right breast in female, estrogen receptor positive (HCC) - 2021, lumpectomy, RTX, on tamoxifen .  She has noticed increased pain since she has been on tamoxifen .  Attention deficit  Obstructive sleep apnea - on CPAP  Family history of psoriatic arthritis-Sister  Other fatigue - Plan: CK, Serum protein electrophoresis with reflex  Chronic pain of both shoulders  Family history of Crohn's disease  Orders: Orders Placed This Encounter  Procedures   Protein / creatinine ratio, urine   CK   Anti-scleroderma antibody   RNP Antibody   Anti-Smith antibody   Anti-DNA antibody, double-stranded   Sjogrens syndrome-B extractable nuclear antibody   Sjogrens syndrome-A extractable nuclear antibody   C3 and C4   Beta-2 glycoprotein antibodies   Cardiolipin antibodies, IgG, IgM, IgA   Serum protein electrophoresis with reflex   No orders of the defined types were placed in this encounter.   Follow-Up Instructions: Return if symptoms worsen or fail to improve, for +ANA.   Amber Nash, MD  Note - This record has been created using Animal nutritionist.  Chart creation errors have been sought, but may not always  have been located. Such creation errors do not reflect on  the standard of medical care.

## 2024-01-21 ENCOUNTER — Ambulatory Visit

## 2024-01-22 ENCOUNTER — Ambulatory Visit
Admission: RE | Admit: 2024-01-22 | Discharge: 2024-01-22 | Disposition: A | Source: Ambulatory Visit | Attending: Hematology and Oncology

## 2024-01-22 DIAGNOSIS — Z1231 Encounter for screening mammogram for malignant neoplasm of breast: Secondary | ICD-10-CM

## 2024-01-22 DIAGNOSIS — Z170 Estrogen receptor positive status [ER+]: Secondary | ICD-10-CM | POA: Diagnosis not present

## 2024-01-22 DIAGNOSIS — C50411 Malignant neoplasm of upper-outer quadrant of right female breast: Secondary | ICD-10-CM | POA: Diagnosis not present

## 2024-01-23 DIAGNOSIS — H52223 Regular astigmatism, bilateral: Secondary | ICD-10-CM | POA: Diagnosis not present

## 2024-01-23 DIAGNOSIS — Z130 Encounter for screening for diseases of the blood and blood-forming organs and certain disorders involving the immune mechanism: Secondary | ICD-10-CM | POA: Diagnosis not present

## 2024-01-23 DIAGNOSIS — H5213 Myopia, bilateral: Secondary | ICD-10-CM | POA: Diagnosis not present

## 2024-01-23 DIAGNOSIS — Z01419 Encounter for gynecological examination (general) (routine) without abnormal findings: Secondary | ICD-10-CM | POA: Diagnosis not present

## 2024-01-23 DIAGNOSIS — N951 Menopausal and female climacteric states: Secondary | ICD-10-CM | POA: Diagnosis not present

## 2024-01-23 DIAGNOSIS — Z1389 Encounter for screening for other disorder: Secondary | ICD-10-CM | POA: Diagnosis not present

## 2024-01-29 ENCOUNTER — Encounter: Payer: Self-pay | Admitting: Family

## 2024-01-30 ENCOUNTER — Telehealth: Payer: Self-pay | Admitting: Hematology and Oncology

## 2024-01-30 NOTE — Telephone Encounter (Signed)
 I left the pt a voicemail regarding 09/08/24 appt being rescheduled to 09/09/24.

## 2024-01-31 ENCOUNTER — Encounter: Payer: Self-pay | Admitting: Rheumatology

## 2024-01-31 ENCOUNTER — Ambulatory Visit: Attending: Rheumatology | Admitting: Rheumatology

## 2024-01-31 VITALS — BP 97/60 | HR 90 | Temp 98.3°F | Resp 16 | Ht 67.5 in | Wt 130.8 lb

## 2024-01-31 DIAGNOSIS — M19041 Primary osteoarthritis, right hand: Secondary | ICD-10-CM

## 2024-01-31 DIAGNOSIS — M5030 Other cervical disc degeneration, unspecified cervical region: Secondary | ICD-10-CM

## 2024-01-31 DIAGNOSIS — M7701 Medial epicondylitis, right elbow: Secondary | ICD-10-CM | POA: Diagnosis not present

## 2024-01-31 DIAGNOSIS — M7061 Trochanteric bursitis, right hip: Secondary | ICD-10-CM

## 2024-01-31 DIAGNOSIS — G4709 Other insomnia: Secondary | ICD-10-CM | POA: Diagnosis not present

## 2024-01-31 DIAGNOSIS — G8929 Other chronic pain: Secondary | ICD-10-CM

## 2024-01-31 DIAGNOSIS — R41840 Attention and concentration deficit: Secondary | ICD-10-CM | POA: Diagnosis not present

## 2024-01-31 DIAGNOSIS — Z840 Family history of diseases of the skin and subcutaneous tissue: Secondary | ICD-10-CM

## 2024-01-31 DIAGNOSIS — M7702 Medial epicondylitis, left elbow: Secondary | ICD-10-CM

## 2024-01-31 DIAGNOSIS — M25512 Pain in left shoulder: Secondary | ICD-10-CM

## 2024-01-31 DIAGNOSIS — M7062 Trochanteric bursitis, left hip: Secondary | ICD-10-CM

## 2024-01-31 DIAGNOSIS — G4733 Obstructive sleep apnea (adult) (pediatric): Secondary | ICD-10-CM

## 2024-01-31 DIAGNOSIS — C50411 Malignant neoplasm of upper-outer quadrant of right female breast: Secondary | ICD-10-CM | POA: Diagnosis not present

## 2024-01-31 DIAGNOSIS — M25511 Pain in right shoulder: Secondary | ICD-10-CM

## 2024-01-31 DIAGNOSIS — M2012 Hallux valgus (acquired), left foot: Secondary | ICD-10-CM

## 2024-01-31 DIAGNOSIS — R7689 Other specified abnormal immunological findings in serum: Secondary | ICD-10-CM | POA: Diagnosis not present

## 2024-01-31 DIAGNOSIS — M2011 Hallux valgus (acquired), right foot: Secondary | ICD-10-CM

## 2024-01-31 DIAGNOSIS — Z8379 Family history of other diseases of the digestive system: Secondary | ICD-10-CM

## 2024-01-31 DIAGNOSIS — Z170 Estrogen receptor positive status [ER+]: Secondary | ICD-10-CM

## 2024-01-31 DIAGNOSIS — M19042 Primary osteoarthritis, left hand: Secondary | ICD-10-CM

## 2024-01-31 DIAGNOSIS — R5383 Other fatigue: Secondary | ICD-10-CM | POA: Diagnosis not present

## 2024-01-31 NOTE — Patient Instructions (Addendum)
 Osteoarthritis  Osteoarthritis is a type of arthritis. It refers to joint pain or joint disease. Osteoarthritis affects tissue that covers the ends of bones in joints (cartilage). Cartilage acts as a cushion between the bones and helps them move smoothly. Osteoarthritis occurs when cartilage in the joints gets worn down. Osteoarthritis is sometimes called wear and tear arthritis. Osteoarthritis is the most common form of arthritis. It often occurs in older people. It is a condition that gets worse over time. The joints most often affected by this condition are in the fingers, toes, hips, knees, and spine, including the neck and lower back. What are the causes? This condition is caused by the wearing down of cartilage that covers the ends of bones. What increases the risk? The following factors may make you more likely to develop this condition: Being age 42 or older. Obesity. Overuse of joints. Past injury of a joint. Past surgery on a joint. Family history of osteoarthritis. What are the signs or symptoms? The main symptoms of this condition are pain, swelling, and stiffness in the joint. Other symptoms may include: An enlarged joint. More pain and further damage caused by small pieces of bone or cartilage that break off and float inside of the joint. Small deposits of bone (osteophytes) that grow on the edges of the joint. A grating or scraping feeling inside the joint when you move it. Popping or creaking sounds when you move. Difficulty walking or exercising. An inability to grip items, twist your hand, or control the movements of your hands and fingers. How is this diagnosed? This condition may be diagnosed based on: Your medical history. A physical exam. Your symptoms. X-rays of the affected joints. Blood tests to rule out other types of arthritis. How is this treated? There is no cure for this condition, but treatment can help control pain and improve joint function.  Treatment may include a combination of therapies, such as: Pain relief techniques, such as: Applying heat and cold to the joint. Massage. A form of talk therapy called cognitive behavioral therapy (CBT). This therapy helps you set goals and follow up on the changes that you make. Medicines for pain and inflammation. The medicines can be taken by mouth or applied to the skin. They include: NSAIDs, such as ibuprofen. Prescription medicines. Strong anti-inflammatory medicines (corticosteroids). Certain nutritional supplements. A prescribed exercise program. You may work with a physical therapist. Assistive devices, such as a brace, wrap, splint, specialized glove, or cane. A weight control plan. Surgery, such as: An osteotomy. This is done to reposition the bones and relieve pain or to remove loose pieces of bone and cartilage. Joint replacement surgery. You may need this surgery if you have advanced osteoarthritis. Follow these instructions at home: Activity Rest your affected joints as told by your health care provider. Exercise as told by your provider. The provider may recommend specific types of exercise, such as: Strengthening exercises. These are done to strengthen the muscles that support joints affected by arthritis. Aerobic activities. These are exercises, such as brisk walking or water aerobics, that increase your heart rate. Range-of-motion activities. These help your joints move more easily. Balance and agility exercises. Managing pain, stiffness, and swelling     If told, apply heat to the affected area as often as told by your provider. Use the heat source that your provider recommends, such as a moist heat pack or a heating pad. If you have a removable assistive device, remove it as told by your provider. Place a  towel between your skin and the heat source. If your provider tells you to keep the assistive device on while you apply heat, place a towel between the assistive  device and the heat source. Leave the heat on for 20-30 minutes. If told, put ice on the affected area. If you have a removable assistive device, remove it as told by your provider. Put ice in a plastic bag. Place a towel between your skin and the bag. If your provider tells you to keep the assistive device on during icing, place a towel between the assistive device and the bag. Leave the ice on for 20 minutes, 2-3 times a day. If your skin turns bright red, remove the ice or heat right away to prevent skin damage. The risk of damage is higher if you cannot feel pain, heat, or cold. Move your fingers or toes often to reduce stiffness and swelling. Raise (elevate) the affected area above the level of your heart while you are sitting or lying down. General instructions Take over-the-counter and prescription medicines only as told by your provider. Maintain a healthy weight. Follow instructions from your provider for weight control. Do not use any products that contain nicotine or tobacco. These products include cigarettes, chewing tobacco, and vaping devices, such as e-cigarettes. If you need help quitting, ask your provider. Use assistive devices as told by your provider. Where to find more information [employer] Musculoskeletal [employer] Skin Diseases: niams.http://www.myers.net/ [employer] on Aging: baseringtones.pl American College of Rheumatology: rheumatology.org Contact a health care provider if: You have redness, swelling, or a feeling of warmth in a joint that gets worse. You have a fever along with joint or muscle aches. You develop a rash. You have trouble doing your normal activities. You have pain that gets worse and is not relieved by pain medicine. This information is not intended to replace advice given to you by your health care provider. Make sure you discuss any questions you have with your health care provider. Document Revised: 09/29/2021 Document Reviewed:  09/29/2021 [employer] Patient Education  2024 [employer]Shoulder Exercises Ask your health care provider which exercises are safe for you. Do exercises exactly as told by your health care provider and adjust them as directed. It is normal to feel mild stretching, pulling, tightness, or discomfort as you do these exercises. Stop right away if you feel sudden pain or your pain gets worse. Do not begin these exercises until told by your health care provider. Stretching exercises External rotation and abduction This exercise is sometimes called corner stretch. The exercise rotates your arm outward (external rotation) and moves your arm out from your body (abduction). Stand in a doorway with one of your feet slightly in front of the other. This is called a staggered stance. If you cannot reach your forearms to the door frame, stand facing a corner of a room. Choose one of the following positions as told by your health care provider: Place your hands and forearms on the door frame above your head. Place your hands and forearms on the door frame at the height of your head. Place your hands on the door frame at the height of your elbows. Slowly move your weight onto your front foot until you feel a stretch across your chest and in the front of your shoulders. Keep your head and chest upright and keep your abdominal muscles tight. Hold for __________ seconds. To release the stretch, shift your weight to your back foot. Repeat __________  times. Complete this exercise __________ times a day. Extension, standing  Stand and hold a broomstick, a cane, or a similar object behind your back. Your hands should be a little wider than shoulder-width apart. Your palms should face away from your back. Keeping your elbows straight and your shoulder muscles relaxed, move the stick away from your body until you feel a stretch in your shoulders (extension). Avoid shrugging your shoulders while you move the stick. Keep  your shoulder blades tucked down toward the middle of your back. Hold for __________ seconds. Slowly return to the starting position. Repeat __________ times. Complete this exercise __________ times a day. Range-of-motion exercises Pendulum  Stand near a wall or a surface that you can hold onto for balance. Bend at the waist and let your left / right arm hang straight down. Use your other arm to support you. Keep your back straight and do not lock your knees. Relax your left / right arm and shoulder muscles, and move your hips and your trunk so your left / right arm swings freely. Your arm should swing because of the motion of your body, not because you are using your arm or shoulder muscles. Keep moving your hips and trunk so your arm swings in the following directions, as told by your health care provider: Side to side. Forward and backward. In clockwise and counterclockwise circles. Continue each motion for __________ seconds, or for as long as told by your health care provider. Slowly return to the starting position. Repeat __________ times. Complete this exercise __________ times a day. Shoulder flexion, standing  Stand and hold a broomstick, a cane, or a similar object. Place your hands a little more than shoulder-width apart on the object. Your left / right hand should be palm-up, and your other hand should be palm-down. Keep your elbow straight and your shoulder muscles relaxed. Push the stick up with your healthy arm to raise your left / right arm in front of your body, and then over your head until you feel a stretch in your shoulder (flexion). Avoid shrugging your shoulder while you raise your arm. Keep your shoulder blade tucked down toward the middle of your back. Hold for __________ seconds. Slowly return to the starting position. Repeat __________ times. Complete this exercise __________ times a day. Shoulder abduction, standing  Stand and hold a broomstick, a cane, or a  similar object. Place your hands a little more than shoulder-width apart on the object. Your left / right hand should be palm-up, and your other hand should be palm-down. Keep your elbow straight and your shoulder muscles relaxed. Push the object across your body toward your left / right side. Raise your left / right arm to the side of your body (abduction) until you feel a stretch in your shoulder. Do not raise your arm above shoulder height unless your health care provider tells you to do that. If directed, raise your arm over your head. Avoid shrugging your shoulder while you raise your arm. Keep your shoulder blade tucked down toward the middle of your back. Hold for __________ seconds. Slowly return to the starting position. Repeat __________ times. Complete this exercise __________ times a day. Internal rotation  Place your left / right hand behind your back, palm-up. Use your other hand to dangle an exercise band, a broomstick, or a similar object over your shoulder. Grasp the band with your left / right hand so you are holding on to both ends. Gently pull up on the  band until you feel a stretch in the front of your left / right shoulder. The movement of your arm toward the center of your body is called internal rotation. Avoid shrugging your shoulder while you raise your arm. Keep your shoulder blade tucked down toward the middle of your back. Hold for __________ seconds. Release the stretch by letting go of the band and lowering your hands. Repeat __________ times. Complete this exercise __________ times a day. Strengthening exercises External rotation  Sit in a stable chair without armrests. Secure an exercise band to a stable object at elbow height on your left / right side. Place a soft object, such as a folded towel or a small pillow, between your left / right upper arm and your body to move your elbow about 4 inches (10 cm) away from your side. Hold the end of the exercise band  so it is tight and there is no slack. Keeping your elbow pressed against the soft object, slowly move your forearm out, away from your abdomen (external rotation). Keep your body steady so only your forearm moves. Hold for __________ seconds. Slowly return to the starting position. Repeat __________ times. Complete this exercise __________ times a day. Shoulder abduction  Sit in a stable chair without armrests, or stand up. Hold a __________ lb / kg weight in your left / right hand, or hold an exercise band with both hands. Start with your arms straight down and your left / right palm facing in, toward your body. Slowly lift your left / right hand out to your side (abduction). Do not lift your hand above shoulder height unless your health care provider tells you that this is safe. Keep your arms straight. Avoid shrugging your shoulder while you do this movement. Keep your shoulder blade tucked down toward the middle of your back. Hold for __________ seconds. Slowly lower your arm, and return to the starting position. Repeat __________ times. Complete this exercise __________ times a day. Shoulder extension  Sit in a stable chair without armrests, or stand up. Secure an exercise band to a stable object in front of you so it is at shoulder height. Hold one end of the exercise band in each hand. Straighten your elbows and lift your hands up to shoulder height. Squeeze your shoulder blades together as you pull your hands down to the sides of your thighs (extension). Stop when your hands are straight down by your sides. Do not let your hands go behind your body. Hold for __________ seconds. Slowly return to the starting position. Repeat __________ times. Complete this exercise __________ times a day. Shoulder row  Sit in a stable chair without armrests, or stand up. Secure an exercise band to a stable object in front of you so it is at chest height. Hold one end of the exercise band in each  hand. Position your palms so that your thumbs are facing the ceiling (neutral position). Bend each of your elbows to a 90-degree angle (right angle) and keep your upper arms at your sides. Step back or move the chair back until the band is tight and there is no slack. Slowly pull your elbows back behind you. Hold for __________ seconds. Slowly return to the starting position. Repeat __________ times. Complete this exercise __________ times a day. Shoulder press-ups  Sit in a stable chair that has armrests. Sit upright, with your feet flat on the floor. Put your hands on the armrests so your elbows are bent and your fingers  are pointing forward. Your hands should be about even with the sides of your body. Push down on the armrests and use your arms to lift yourself off the chair. Straighten your elbows and lift yourself up as much as you comfortably can. Move your shoulder blades down, and avoid letting your shoulders move up toward your ears. Keep your feet on the ground. As you get stronger, your feet should support less of your body weight as you lift yourself up. Hold for __________ seconds. Slowly lower yourself back into the chair. Repeat __________ times. Complete this exercise __________ times a day. Wall push-ups  Stand so you are facing a stable wall. Your feet should be about one arm-length away from the wall. Lean forward and place your palms on the wall at shoulder height. Keep your feet flat on the floor as you bend your elbows and lean forward toward the wall. Hold for __________ seconds. Straighten your elbows to push yourself back to the starting position. Repeat __________ times. Complete this exercise __________ times a day. This information is not intended to replace advice given to you by your health care provider. Make sure you discuss any questions you have with your health care provider. Document Revised: 03/22/2021 Document Reviewed: 03/22/2021 [employer] Patient  Education  2024 [employer]Exercises for Golfer's Elbow Elbow exercises can help you get better if you have golfer's elbow. Only do the exercises you were told to do. Make sure you know how to do the exercises safely. Follow the steps below. It's normal to feel mild discomfort. Stop if you feel pain or your pain gets worse. Do not start these exercises until told by your health care provider. Stretching and range-of-motion exercises These exercises warm up your muscles and joints. They can help your elbow move better and be more flexible. Wrist extension, assisted  Straighten your left / right elbow in front of you with your palm facing up toward the ceiling. If told by your provider, bend your left / right elbow to a 90-degree angle (right angle) at your side. Do this instead of holding it straight. With your other hand, gently pull your left / right hand and fingers toward the floor. Stop when you feel a gentle stretch on the palm side of your forearm. Hold this position for __________ seconds. Repeat __________ times. Do this exercise __________ times a day. Wrist flexion, assisted  Straighten your left / right elbow in front of you with your palm facing down toward the floor. If told by your provider, bend your left / right elbow to a 90-degree angle at your side. Do this instead of holding it straight. With your other hand, gently push over the back of your left / right hand so your fingers point toward the floor. Stop when you feel a gentle stretch on the back of your forearm. Hold this position for __________ seconds. Repeat __________ times. Do this exercise __________ times a day. Assisted forearm rotation, supination  Sit or stand with your elbows at your side. Bend your left / right elbow to a 90-degree angle. Using your uninjured hand, turn your left / right palm up toward the ceiling. Stop when you feel a gentle stretch along the inside of your forearm. Hold this position for  __________ seconds. Repeat __________ times. Do this exercise __________ times a day. Assisted forearm rotation, pronation  Sit or stand with your elbows at your side. Bend your left / right elbow to a 90-degree angle. Using your uninjured  hand, turn your left / right palm down toward the floor. Stop when you feel a gentle stretch along the top of your forearm. Hold this position for __________ seconds. Repeat __________ times. Do this exercise __________ times a day. Strengthening exercises These exercises build strength and endurance in your elbow. Endurance is the ability to use your muscles for a long time, even after they get tired. Wrist flexion  Sit with your left / right forearm supported on a table or other surface. Turn your palm up toward the ceiling. Let your left / right wrist extend over the edge of the surface. Hold a __________ weight or a piece of rubber exercise band or tubing. If using a rubber exercise band or tubing, hold the other end of the tubing with your other hand. Slowly bend your wrist so your hand moves up toward the ceiling. Try to only move your wrist. Keep the rest of your arm still. Hold this position for __________ seconds. Slowly go back to the starting position. Repeat __________ times. Do this exercise __________ times a day. Wrist flexion, eccentric  Sit with your left / right forearm supported on a table or other surface. Turn your palm up toward the ceiling. Let your left / right wrist extend over the edge of the surface. Hold a __________ weight or a piece of rubber exercise band or tubing in your left / right hand. If using a rubber exercise band or tubing, hold the other end of the tubing with your other hand. Use your uninjured hand to move your left / right hand up toward the ceiling. Take your other hand away. Slowly go back to the starting position using only your left / right hand. Repeat __________ times. Do this exercise __________ times a  day. Forearm rotation, pronation To do this exercise, you'll need a lightweight hammer or rubber mallet. Sit with your left / right forearm supported on a table or other surface. Bend your elbow to a 90-degree angle. Have your forearm so that your palm is facing up toward the ceiling, with your hand resting over the edge of the table. Hold a hammer in your left / right hand. To make this exercise easier, hold the hammer near the head of the hammer. To make this exercise harder, hold the hammer near the end of the handle. Without moving your elbow, slowly turn your forearm so your palm faces down toward the floor. Hold this position for __________ seconds. Slowly return to the starting position. Repeat __________ times. Do this exercise __________ times a day. Shoulder blade squeeze     Sit in a stable chair or stand with good posture. If you're sitting down, don't let your back touch the back of the chair. Your arms should be at your sides with your elbows bent to a 90-degree angle. Position your forearms so that your thumbs are facing the ceiling. Without lifting your shoulders up, squeeze your shoulder blades tightly together. Hold this position for __________ seconds. Slowly release. Go back to the starting position. Repeat __________ times. Do this exercise __________ times a day. This information is not intended to replace advice given to you by your health care provider. Make sure you discuss any questions you have with your health care provider. Document Revised: 08/24/2022 Document Reviewed: 08/24/2022 [employer] Patient Education  2024 [employer]Hand Exercises Hand exercises can be helpful for almost anyone. They can strengthen your hands and improve flexibility and movement. The exercises can also increase blood flow  to the hands. These results can make your work and daily tasks easier for you. Hand exercises can be especially helpful for people who have joint pain from arthritis  or nerve damage from using their hands over and over. These exercises can also help people who injure a hand. Exercises Most of these hand exercises are gentle stretching and motion exercises. It is usually safe to do them often throughout the day. Warming up your hands before exercise may help reduce stiffness. You can do this with gentle massage or by placing your hands in warm water for 10-15 minutes. It is normal to feel some stretching, pulling, tightness, or mild discomfort when you begin new exercises. In time, this will improve. Remember to always be careful and stop right away if you feel sudden, very bad pain or your pain gets worse. You want to get better and be safe. Ask your health care provider which exercises are safe for you. Do exercises exactly as told by your provider and adjust them as told. Do not begin these exercises until told by your provider. Knuckle bend or claw fist  Stand or sit with your arm, hand, and all five fingers pointed straight up. Make sure to keep your wrist straight. Gently bend your fingers down toward your palm until the tips of your fingers are touching your palm. Keep your big knuckle straight and only bend the small knuckles in your fingers. Hold this position for 10 seconds. Straighten your fingers back to your starting position. Repeat this exercise 5-10 times with each hand. Full finger fist  Stand or sit with your arm, hand, and all five fingers pointed straight up. Make sure to keep your wrist straight. Gently bend your fingers into your palm until the tips of your fingers are touching the middle of your palm. Hold this position for 10 seconds. Extend your fingers back to your starting position, stretching every joint fully. Repeat this exercise 5-10 times with each hand. Straight fist  Stand or sit with your arm, hand, and all five fingers pointed straight up. Make sure to keep your wrist straight. Gently bend your fingers at the big  knuckle, where your fingers meet your hand, and at the middle knuckle. Keep the knuckle at the tips of your fingers straight and try to touch the bottom of your palm. Hold this position for 10 seconds. Extend your fingers back to your starting position, stretching every joint fully. Repeat this exercise 5-10 times with each hand. Tabletop  Stand or sit with your arm, hand, and all five fingers pointed straight up. Make sure to keep your wrist straight. Gently bend your fingers at the big knuckle, where your fingers meet your hand, as far down as you can. Keep the small knuckles in your fingers straight. Think of forming a tabletop with your fingers. Hold this position for 10 seconds. Extend your fingers back to your starting position, stretching every joint fully. Repeat this exercise 5-10 times with each hand. Finger spread  Place your hand flat on a table with your palm facing down. Make sure your wrist stays straight. Spread your fingers and thumb apart from each other as far as you can until you feel a gentle stretch. Hold this position for 10 seconds. Bring your fingers and thumb tight together again. Hold this position for 10 seconds. Repeat this exercise 5-10 times with each hand. Making circles  Stand or sit with your arm, hand, and all five fingers pointed straight up. Make sure  to keep your wrist straight. Make a circle by touching the tip of your thumb to the tip of your index finger. Hold for 10 seconds. Then open your hand wide. Repeat this motion with your thumb and each of your fingers. Repeat this exercise 5-10 times with each hand. Thumb motion  Sit with your forearm resting on a table and your wrist straight. Your thumb should be facing up toward the ceiling. Keep your fingers relaxed as you move your thumb. Lift your thumb up as high as you can toward the ceiling. Hold for 10 seconds. Bend your thumb across your palm as far as you can, reaching the tip of your thumb for  the small finger (pinkie) side of your palm. Hold for 10 seconds. Repeat this exercise 5-10 times with each hand. Grip strengthening  Hold a stress ball or other soft ball in the middle of your hand. Slowly increase the pressure, squeezing the ball as much as you can without causing pain. Think of bringing the tips of your fingers into the middle of your palm. All of your finger joints should bend when doing this exercise. Hold your squeeze for 10 seconds, then relax. Repeat this exercise 5-10 times with each hand. Contact a health care provider if: Your hand pain or discomfort gets much worse when you do an exercise. Your hand pain or discomfort does not improve within 2 hours after you exercise. If you have either of these problems, stop doing these exercises right away. Do not do them again unless your provider says that you can. Get help right away if: You develop sudden, severe hand pain or swelling. If this happens, stop doing these exercises right away. Do not do them again unless your provider says that you can. This information is not intended to replace advice given to you by your health care provider. Make sure you discuss any questions you have with your health care provider. Document Revised: 02/14/2022 Document Reviewed: 02/14/2022 [employer] Patient Education  2024 [employer]Iliotibial Band Syndrome Rehab Ask your health care provider which exercises are safe for you. Do exercises exactly as told by your provider and adjust them as told. It's normal to feel mild stretching, pulling, tightness, or discomfort as you do these exercises. Stop right away if you feel sudden pain or your pain gets a lot worse. Do not begin these exercises until told by your provider. Stretching and range-of-motion exercises These exercises warm up your muscles and joints. They also improve the movement and flexibility of your hip and pelvis. Quadriceps stretch, prone  Lie face down (prone) on a firm  surface like a bed or padded floor. Bend your left / right knee. Reach back to hold your ankle or pant leg. If you can't reach your ankle or pant leg, use a belt looped around your foot and grab the belt instead. Gently pull your heel toward your butt. Your knee should not slide out to the side. You should feel a stretch in the front of your thigh and knee, also called the quadriceps. Hold this position for __________ seconds. Repeat __________ times. Complete this exercise __________ times a day. Iliotibial band stretch The iliotibial band is a strip of tissue that runs along the outside of your hip down to your knee. Lie on your side with your left / right leg on top. Bend both knees and grab your left / right ankle. Stretch out your bottom arm to help you balance. Slowly bring your top knee back  so your thigh goes behind your back. Slowly lower your top leg toward the floor until you feel a gentle stretch on the outside of your left / right hip and thigh. If you don't feel a stretch and your knee won't go farther, place the heel of your other foot on top of your knee and pull your knee down toward the floor with your foot. Hold this position for __________ seconds. Repeat __________ times. Complete this exercise __________ times a day. Strengthening exercises These exercises build strength and endurance in your hip and pelvis. Endurance means your muscles can keep working even when they're tired. Straight leg raises, side-lying This exercise strengthens the muscles that rotate the leg at the hip and move it away from your body. These muscles are called hip abductors. Lie on your side with your left / right leg on top. Lie so your head, shoulder, hip, and knee line up. You can bend your bottom knee to help you balance. Roll your hips slightly forward so they're stacked directly over each other. Your left / right knee should face forward. Tense the muscles in your outer thigh and hip. Lift your  top leg 4-6 inches (10-15 cm) off the ground. Hold this position for __________ seconds. Slowly lower your leg back down to the starting position. Let your muscles fully relax before doing this exercise again. Repeat __________ times. Complete this exercise __________ times a day. Leg raises, prone This exercise strengthens the muscles that move the hips backward. These muscles are called hip extensors. Lie face down (prone) on your bed or a firm surface. You can put a pillow under your hips for comfort and to support your lower back. Bend your left / right knee so your foot points straight up toward the ceiling. Keep the other leg straight and behind you. Squeeze your butt muscles. Lift your left / right thigh off the firm surface. Do not let your back arch. Tense your thigh muscle as hard as you can without having more knee pain. Hold this position for __________ seconds. Slowly lower your leg to the starting position. Allow your leg to relax all the way. Repeat __________ times. Complete this exercise __________ times a day. Hip hike  Stand sideways on a bottom step. Place your feet so that your left / right leg is on the step, and the other foot is hanging off the side. If you need support for balance, hold onto a railing or wall. Keep your knees straight and your abdomen square, meaning your hips are level. Then, lift your left / right hip up toward the ceiling. Slowly let your leg that's hanging off the step lower towards the floor. Your foot should get closer to the ground. Do not lean or bend your knees during this movement. Repeat __________ times. Complete this exercise __________ times a day. This information is not intended to replace advice given to you by your health care provider. Make sure you discuss any questions you have with your health care provider. Document Revised: 04/14/2022 Document Reviewed: 04/14/2022 [employer] Patient Education  2024 [employer].

## 2024-02-03 ENCOUNTER — Ambulatory Visit: Payer: Self-pay | Admitting: Rheumatology

## 2024-02-03 LAB — CARDIOLIPIN ANTIBODIES, IGG, IGM, IGA
Anticardiolipin IgA: <2 [APL'U]/mL
Anticardiolipin IgG: <2 [GPL'U]/mL
Anticardiolipin IgM: 5.5 [MPL'U]/mL

## 2024-02-03 LAB — PROTEIN ELECTROPHORESIS, SERUM, WITH REFLEX
Albumin ELP: 4.3 g/dL (ref 3.8–4.8)
Alpha 1: 0.3 g/dL (ref 0.2–0.3)
Alpha 2: 0.5 g/dL (ref 0.5–0.9)
Beta 2: 0.3 g/dL (ref 0.2–0.5)
Beta Globulin: 0.4 g/dL (ref 0.4–0.6)
Gamma Globulin: 1 g/dL (ref 0.8–1.7)
Total Protein: 6.8 g/dL (ref 6.1–8.1)

## 2024-02-03 LAB — BETA-2 GLYCOPROTEIN ANTIBODIES
Beta-2 Glyco 1 IgA: <2 U/mL
Beta-2 Glyco 1 IgM: 4.7 U/mL
Beta-2 Glyco I IgG: <2 U/mL

## 2024-02-03 LAB — ANTI-SMITH ANTIBODY: ENA SM Ab Ser-aCnc: <1 AI

## 2024-02-03 LAB — CK: Total CK: 73 U/L (ref 21–240)

## 2024-02-03 LAB — PROTEIN / CREATININE RATIO, URINE
Creatinine, Urine: 111 mg/dL (ref 20–275)
Protein/Creat Ratio: 63 mg/g{creat} (ref 24–184)
Protein/Creatinine Ratio: 0.063 mg/mg{creat} (ref 0.024–0.184)
Total Protein, Urine: 7 mg/dL (ref 5–24)

## 2024-02-03 LAB — ANTI-DNA ANTIBODY, DOUBLE-STRANDED: ds DNA Ab: 1 [IU]/mL

## 2024-02-03 LAB — ANTI-SCLERODERMA ANTIBODY: Scleroderma (Scl-70) (ENA) Antibody, IgG: <1 AI

## 2024-02-03 LAB — C3 AND C4
C3 Complement: 114 mg/dL (ref 83–193)
C4 Complement: 28 mg/dL (ref 15–57)

## 2024-02-03 LAB — SJOGRENS SYNDROME-B EXTRACTABLE NUCLEAR ANTIBODY: SSB (La) (ENA) Antibody, IgG: <1 AI

## 2024-02-03 LAB — RNP ANTIBODY: Ribonucleic Protein(ENA) Antibody, IgG: <1 AI

## 2024-02-03 LAB — SJOGRENS SYNDROME-A EXTRACTABLE NUCLEAR ANTIBODY: SSA (Ro) (ENA) Antibody, IgG: <1 AI

## 2024-02-10 DIAGNOSIS — G4733 Obstructive sleep apnea (adult) (pediatric): Secondary | ICD-10-CM | POA: Diagnosis not present

## 2024-03-05 ENCOUNTER — Ambulatory Visit: Admitting: Rheumatology

## 2024-09-08 ENCOUNTER — Ambulatory Visit: Admitting: Hematology and Oncology

## 2024-09-09 ENCOUNTER — Inpatient Hospital Stay: Admitting: Hematology and Oncology
